# Patient Record
Sex: Male | Born: 1935 | Race: White | Hispanic: No | Smoking: Former smoker
Health system: Southern US, Community
[De-identification: ages and names within clinical notes are randomized; demographics above are authoritative.]

## PROBLEM LIST (undated history)

## (undated) DIAGNOSIS — H3530 Unspecified macular degeneration: Secondary | ICD-10-CM

## (undated) DIAGNOSIS — J449 Chronic obstructive pulmonary disease, unspecified: Secondary | ICD-10-CM

## (undated) DIAGNOSIS — I4892 Unspecified atrial flutter: Secondary | ICD-10-CM

## (undated) DIAGNOSIS — M1990 Unspecified osteoarthritis, unspecified site: Secondary | ICD-10-CM

## (undated) DIAGNOSIS — C61 Malignant neoplasm of prostate: Secondary | ICD-10-CM

## (undated) DIAGNOSIS — E7800 Pure hypercholesterolemia, unspecified: Secondary | ICD-10-CM

## (undated) DIAGNOSIS — I4891 Unspecified atrial fibrillation: Secondary | ICD-10-CM

## (undated) DIAGNOSIS — I10 Essential (primary) hypertension: Secondary | ICD-10-CM

## (undated) DIAGNOSIS — I701 Atherosclerosis of renal artery: Secondary | ICD-10-CM

## (undated) DIAGNOSIS — I714 Abdominal aortic aneurysm, without rupture, unspecified: Secondary | ICD-10-CM

## (undated) DIAGNOSIS — S72009A Fracture of unspecified part of neck of unspecified femur, initial encounter for closed fracture: Secondary | ICD-10-CM

## (undated) DIAGNOSIS — I6529 Occlusion and stenosis of unspecified carotid artery: Secondary | ICD-10-CM

## (undated) HISTORY — PX: APPENDECTOMY: SHX54

## (undated) HISTORY — DX: Unspecified atrial flutter: I48.92

## (undated) HISTORY — DX: Malignant neoplasm of prostate: C61

## (undated) HISTORY — DX: Chronic obstructive pulmonary disease, unspecified: J44.9

## (undated) HISTORY — DX: Atherosclerosis of renal artery: I70.1

## (undated) HISTORY — DX: Abdominal aortic aneurysm, without rupture: I71.4

## (undated) HISTORY — DX: Pure hypercholesterolemia, unspecified: E78.00

## (undated) HISTORY — DX: Fracture of unspecified part of neck of unspecified femur, initial encounter for closed fracture: S72.009A

## (undated) HISTORY — DX: Essential (primary) hypertension: I10

## (undated) HISTORY — PX: CHOLECYSTECTOMY: SHX55

## (undated) HISTORY — DX: Occlusion and stenosis of unspecified carotid artery: I65.29

## (undated) HISTORY — DX: Abdominal aortic aneurysm, without rupture, unspecified: I71.40

## (undated) HISTORY — DX: Unspecified atrial fibrillation: I48.91

## (undated) HISTORY — PX: TONSILLECTOMY: SUR1361

## (undated) HISTORY — DX: Unspecified osteoarthritis, unspecified site: M19.90

---

## 1998-05-12 ENCOUNTER — Emergency Department (HOSPITAL_COMMUNITY): Admission: EM | Admit: 1998-05-12 | Discharge: 1998-05-12 | Payer: Self-pay | Admitting: Emergency Medicine

## 1998-05-20 ENCOUNTER — Emergency Department (HOSPITAL_COMMUNITY): Admission: EM | Admit: 1998-05-20 | Discharge: 1998-05-20 | Payer: Self-pay | Admitting: Emergency Medicine

## 1998-05-27 ENCOUNTER — Emergency Department (HOSPITAL_COMMUNITY): Admission: EM | Admit: 1998-05-27 | Discharge: 1998-05-27 | Payer: Self-pay | Admitting: Emergency Medicine

## 1998-07-21 ENCOUNTER — Encounter: Admission: RE | Admit: 1998-07-21 | Discharge: 1998-10-19 | Payer: Self-pay | Admitting: Radiation Oncology

## 1998-08-18 ENCOUNTER — Encounter: Payer: Self-pay | Admitting: Urology

## 1998-10-13 ENCOUNTER — Ambulatory Visit (HOSPITAL_BASED_OUTPATIENT_CLINIC_OR_DEPARTMENT_OTHER): Admission: RE | Admit: 1998-10-13 | Discharge: 1998-10-13 | Payer: Self-pay | Admitting: Urology

## 1998-10-13 ENCOUNTER — Encounter: Payer: Self-pay | Admitting: Urology

## 1998-11-29 ENCOUNTER — Ambulatory Visit (HOSPITAL_COMMUNITY): Admission: RE | Admit: 1998-11-29 | Discharge: 1998-11-29 | Payer: Self-pay | Admitting: Radiation Oncology

## 1998-11-29 ENCOUNTER — Encounter: Payer: Self-pay | Admitting: Radiation Oncology

## 2000-06-29 ENCOUNTER — Emergency Department (HOSPITAL_COMMUNITY): Admission: EM | Admit: 2000-06-29 | Discharge: 2000-06-29 | Payer: Self-pay | Admitting: Emergency Medicine

## 2000-06-29 ENCOUNTER — Encounter: Payer: Self-pay | Admitting: Emergency Medicine

## 2000-11-07 ENCOUNTER — Ambulatory Visit (HOSPITAL_COMMUNITY): Admission: RE | Admit: 2000-11-07 | Discharge: 2000-11-07 | Payer: Self-pay | Admitting: Ophthalmology

## 2003-11-23 ENCOUNTER — Encounter: Payer: Self-pay | Admitting: Internal Medicine

## 2004-03-14 ENCOUNTER — Ambulatory Visit: Payer: Self-pay | Admitting: Internal Medicine

## 2004-07-05 ENCOUNTER — Ambulatory Visit: Payer: Self-pay | Admitting: Internal Medicine

## 2004-10-04 ENCOUNTER — Ambulatory Visit: Payer: Self-pay | Admitting: Internal Medicine

## 2005-01-13 ENCOUNTER — Ambulatory Visit: Payer: Self-pay | Admitting: Endocrinology

## 2005-01-13 ENCOUNTER — Inpatient Hospital Stay (HOSPITAL_COMMUNITY): Admission: EM | Admit: 2005-01-13 | Discharge: 2005-01-18 | Payer: Self-pay | Admitting: Family Medicine

## 2005-01-14 ENCOUNTER — Ambulatory Visit: Payer: Self-pay | Admitting: Cardiology

## 2005-01-14 ENCOUNTER — Encounter (INDEPENDENT_AMBULATORY_CARE_PROVIDER_SITE_OTHER): Payer: Self-pay | Admitting: *Deleted

## 2005-01-15 ENCOUNTER — Ambulatory Visit: Payer: Self-pay | Admitting: Internal Medicine

## 2005-01-16 ENCOUNTER — Encounter: Payer: Self-pay | Admitting: Cardiology

## 2005-01-21 ENCOUNTER — Ambulatory Visit: Payer: Self-pay | Admitting: Internal Medicine

## 2005-01-24 ENCOUNTER — Ambulatory Visit: Payer: Self-pay | Admitting: Cardiology

## 2005-01-28 ENCOUNTER — Ambulatory Visit: Payer: Self-pay | Admitting: Cardiology

## 2005-01-31 ENCOUNTER — Ambulatory Visit: Payer: Self-pay | Admitting: Internal Medicine

## 2005-02-04 ENCOUNTER — Ambulatory Visit: Payer: Self-pay | Admitting: Cardiology

## 2005-02-04 ENCOUNTER — Ambulatory Visit: Payer: Self-pay

## 2005-02-11 ENCOUNTER — Ambulatory Visit: Payer: Self-pay | Admitting: Cardiology

## 2005-02-18 ENCOUNTER — Ambulatory Visit: Payer: Self-pay | Admitting: Cardiology

## 2005-02-28 ENCOUNTER — Ambulatory Visit: Payer: Self-pay | Admitting: Cardiology

## 2005-03-20 ENCOUNTER — Ambulatory Visit: Payer: Self-pay | Admitting: Cardiology

## 2005-03-28 ENCOUNTER — Ambulatory Visit: Payer: Self-pay | Admitting: Internal Medicine

## 2005-04-02 ENCOUNTER — Ambulatory Visit: Payer: Self-pay

## 2005-04-02 ENCOUNTER — Encounter: Payer: Self-pay | Admitting: Cardiology

## 2005-04-04 ENCOUNTER — Ambulatory Visit: Payer: Self-pay | Admitting: Internal Medicine

## 2005-04-08 ENCOUNTER — Ambulatory Visit: Payer: Self-pay | Admitting: Cardiology

## 2005-05-16 ENCOUNTER — Ambulatory Visit: Payer: Self-pay | Admitting: Cardiology

## 2005-07-04 ENCOUNTER — Ambulatory Visit: Payer: Self-pay | Admitting: Internal Medicine

## 2005-07-07 ENCOUNTER — Encounter: Admission: RE | Admit: 2005-07-07 | Discharge: 2005-07-07 | Payer: Self-pay | Admitting: Internal Medicine

## 2005-07-29 ENCOUNTER — Ambulatory Visit: Payer: Self-pay | Admitting: Internal Medicine

## 2005-09-13 ENCOUNTER — Ambulatory Visit: Payer: Self-pay | Admitting: Internal Medicine

## 2005-10-03 ENCOUNTER — Ambulatory Visit: Payer: Self-pay | Admitting: Internal Medicine

## 2005-10-30 ENCOUNTER — Ambulatory Visit: Payer: Self-pay | Admitting: Cardiology

## 2005-10-31 ENCOUNTER — Ambulatory Visit: Payer: Self-pay | Admitting: Internal Medicine

## 2005-11-26 ENCOUNTER — Ambulatory Visit: Payer: Self-pay

## 2005-12-09 ENCOUNTER — Ambulatory Visit: Payer: Self-pay | Admitting: Cardiology

## 2005-12-11 ENCOUNTER — Ambulatory Visit (HOSPITAL_COMMUNITY): Admission: RE | Admit: 2005-12-11 | Discharge: 2005-12-11 | Payer: Self-pay | Admitting: Cardiology

## 2005-12-13 ENCOUNTER — Ambulatory Visit: Payer: Self-pay | Admitting: Cardiology

## 2005-12-17 ENCOUNTER — Observation Stay (HOSPITAL_COMMUNITY): Admission: RE | Admit: 2005-12-17 | Discharge: 2005-12-18 | Payer: Self-pay | Admitting: Cardiology

## 2005-12-17 ENCOUNTER — Ambulatory Visit: Payer: Self-pay | Admitting: Cardiology

## 2006-01-07 ENCOUNTER — Ambulatory Visit: Payer: Self-pay | Admitting: Internal Medicine

## 2006-01-15 ENCOUNTER — Ambulatory Visit: Payer: Self-pay | Admitting: Cardiology

## 2006-02-12 ENCOUNTER — Ambulatory Visit: Payer: Self-pay | Admitting: Cardiology

## 2006-05-12 ENCOUNTER — Ambulatory Visit: Payer: Self-pay | Admitting: Internal Medicine

## 2006-05-26 ENCOUNTER — Ambulatory Visit: Payer: Self-pay | Admitting: Internal Medicine

## 2006-05-26 LAB — CONVERTED CEMR LAB
BUN: 9 mg/dL (ref 6–23)
CO2: 31 meq/L (ref 19–32)
Creatinine, Ser: 0.8 mg/dL (ref 0.4–1.5)
GFR calc non Af Amer: 99 mL/min

## 2006-08-01 ENCOUNTER — Ambulatory Visit: Payer: Self-pay | Admitting: Cardiology

## 2006-08-01 LAB — CONVERTED CEMR LAB
ALT: 20 units/L (ref 0–40)
Albumin: 3.8 g/dL (ref 3.5–5.2)
Alkaline Phosphatase: 138 units/L — ABNORMAL HIGH (ref 39–117)
BUN: 10 mg/dL (ref 6–23)
CO2: 29 meq/L (ref 19–32)
Calcium: 9.5 mg/dL (ref 8.4–10.5)
GFR calc Af Amer: 140 mL/min
GFR calc non Af Amer: 115 mL/min
Glucose, Bld: 103 mg/dL — ABNORMAL HIGH (ref 70–99)
Potassium: 3.8 meq/L (ref 3.5–5.1)
Sodium: 139 meq/L (ref 135–145)
Total CHOL/HDL Ratio: 3.3
Total Protein: 6.8 g/dL (ref 6.0–8.3)
VLDL: 27 mg/dL (ref 0–40)

## 2006-08-26 ENCOUNTER — Ambulatory Visit: Payer: Self-pay | Admitting: Internal Medicine

## 2006-08-26 ENCOUNTER — Encounter: Payer: Self-pay | Admitting: Internal Medicine

## 2006-08-26 DIAGNOSIS — I428 Other cardiomyopathies: Secondary | ICD-10-CM | POA: Insufficient documentation

## 2006-08-26 DIAGNOSIS — I714 Abdominal aortic aneurysm, without rupture, unspecified: Secondary | ICD-10-CM | POA: Insufficient documentation

## 2006-08-26 DIAGNOSIS — J4489 Other specified chronic obstructive pulmonary disease: Secondary | ICD-10-CM | POA: Insufficient documentation

## 2006-08-26 DIAGNOSIS — J449 Chronic obstructive pulmonary disease, unspecified: Secondary | ICD-10-CM

## 2006-08-26 DIAGNOSIS — I10 Essential (primary) hypertension: Secondary | ICD-10-CM | POA: Insufficient documentation

## 2006-08-26 DIAGNOSIS — I701 Atherosclerosis of renal artery: Secondary | ICD-10-CM

## 2006-08-26 DIAGNOSIS — Z8546 Personal history of malignant neoplasm of prostate: Secondary | ICD-10-CM | POA: Insufficient documentation

## 2006-10-13 ENCOUNTER — Ambulatory Visit: Payer: Self-pay | Admitting: Cardiology

## 2006-10-13 LAB — CONVERTED CEMR LAB
BUN: 14 mg/dL (ref 6–23)
CO2: 29 meq/L (ref 19–32)
Creatinine, Ser: 0.8 mg/dL (ref 0.4–1.5)
GFR calc non Af Amer: 99 mL/min

## 2006-12-15 ENCOUNTER — Telehealth: Payer: Self-pay | Admitting: Internal Medicine

## 2006-12-30 ENCOUNTER — Ambulatory Visit: Payer: Self-pay | Admitting: Internal Medicine

## 2007-01-12 ENCOUNTER — Telehealth (INDEPENDENT_AMBULATORY_CARE_PROVIDER_SITE_OTHER): Payer: Self-pay | Admitting: *Deleted

## 2007-01-12 ENCOUNTER — Telehealth: Payer: Self-pay | Admitting: Internal Medicine

## 2007-03-04 ENCOUNTER — Encounter: Payer: Self-pay | Admitting: Internal Medicine

## 2007-04-13 ENCOUNTER — Ambulatory Visit: Payer: Self-pay | Admitting: Cardiology

## 2007-04-13 ENCOUNTER — Ambulatory Visit: Payer: Self-pay

## 2007-04-13 LAB — CONVERTED CEMR LAB
AST: 36 units/L (ref 0–37)
Alkaline Phosphatase: 138 units/L — ABNORMAL HIGH (ref 39–117)
Bilirubin, Direct: 0.1 mg/dL (ref 0.0–0.3)
CO2: 29 meq/L (ref 19–32)
GFR calc non Af Amer: 99 mL/min
HDL: 39.3 mg/dL (ref >39.0)
LDL Cholesterol: 48 mg/dL (ref 0–99)
Total Bilirubin: 0.6 mg/dL (ref 0.3–1.2)
Total Protein: 6.7 g/dL (ref 6.0–8.3)
Triglycerides: 78 mg/dL (ref 0–149)

## 2007-04-15 ENCOUNTER — Ambulatory Visit: Payer: Self-pay

## 2007-04-30 ENCOUNTER — Ambulatory Visit: Payer: Self-pay | Admitting: Cardiovascular Disease

## 2007-06-01 ENCOUNTER — Ambulatory Visit: Payer: Self-pay | Admitting: Internal Medicine

## 2007-06-01 LAB — CONVERTED CEMR LAB
Basophils Absolute: 0 10*3/uL (ref 0.0–0.1)
Bilirubin Urine: NEGATIVE
Bilirubin, Direct: 0.2 mg/dL (ref 0.0–0.3)
CO2: 30 meq/L (ref 19–32)
Cholesterol: 102 mg/dL (ref 0–200)
Creatinine, Ser: 0.7 mg/dL (ref 0.4–1.5)
Eosinophils Absolute: 0.3 10*3/uL (ref 0.0–0.6)
Eosinophils Relative: 4.1 % (ref 0.0–5.0)
GFR calc non Af Amer: 115 mL/min
HDL: 30.7 mg/dL — ABNORMAL LOW (ref >39.0)
Ketones, urine, test strip: NEGATIVE
LDL Cholesterol: 59 mg/dL (ref 0–99)
Lymphocytes Relative: 22.4 % (ref 12.0–46.0)
MCV: 90.6 fL (ref 78.0–100.0)
Monocytes Absolute: 0.9 10*3/uL — ABNORMAL HIGH (ref 0.2–0.7)
Monocytes Relative: 13.2 % — ABNORMAL HIGH (ref 3.0–11.0)
Neutrophils Relative %: 59.7 % (ref 43.0–77.0)
Nitrite: NEGATIVE
Platelets: 209 10*3/uL (ref 150–400)
Potassium: 3.9 meq/L (ref 3.5–5.1)
RBC: 4.54 M/uL (ref 4.22–5.81)
Sodium: 144 meq/L (ref 135–145)
Specific Gravity, Urine: 1.02
Total Bilirubin: 0.8 mg/dL (ref 0.3–1.2)
Total Protein: 6.5 g/dL (ref 6.0–8.3)
Urobilinogen, UA: 0.2
WBC Urine, dipstick: NEGATIVE

## 2007-06-08 ENCOUNTER — Ambulatory Visit: Payer: Self-pay | Admitting: Internal Medicine

## 2007-07-21 ENCOUNTER — Telehealth: Payer: Self-pay | Admitting: Internal Medicine

## 2007-08-03 ENCOUNTER — Ambulatory Visit: Payer: Self-pay | Admitting: Internal Medicine

## 2007-08-03 ENCOUNTER — Telehealth: Payer: Self-pay | Admitting: Internal Medicine

## 2007-08-03 DIAGNOSIS — H6500 Acute serous otitis media, unspecified ear: Secondary | ICD-10-CM

## 2007-08-03 DIAGNOSIS — R197 Diarrhea, unspecified: Secondary | ICD-10-CM

## 2007-08-07 ENCOUNTER — Telehealth: Payer: Self-pay | Admitting: Internal Medicine

## 2007-09-15 ENCOUNTER — Telehealth: Payer: Self-pay | Admitting: Internal Medicine

## 2007-09-16 ENCOUNTER — Ambulatory Visit: Payer: Self-pay | Admitting: Cardiology

## 2007-09-16 LAB — CONVERTED CEMR LAB
ALT: 28 units/L (ref 0–53)
Albumin: 3.9 g/dL (ref 3.5–5.2)
Bilirubin, Direct: 0.1 mg/dL (ref 0.0–0.3)
Chloride: 103 meq/L (ref 96–112)
GFR calc Af Amer: 119 mL/min
Potassium: 4.3 meq/L (ref 3.5–5.1)
Total Protein: 7.4 g/dL (ref 6.0–8.3)
VLDL: 14 mg/dL (ref 0–40)

## 2007-10-08 ENCOUNTER — Ambulatory Visit: Payer: Self-pay | Admitting: Internal Medicine

## 2007-10-09 ENCOUNTER — Telehealth: Payer: Self-pay | Admitting: Internal Medicine

## 2007-10-15 ENCOUNTER — Ambulatory Visit: Payer: Self-pay

## 2007-12-16 ENCOUNTER — Ambulatory Visit: Payer: Self-pay | Admitting: Internal Medicine

## 2007-12-16 DIAGNOSIS — M25559 Pain in unspecified hip: Secondary | ICD-10-CM | POA: Insufficient documentation

## 2007-12-16 DIAGNOSIS — M545 Low back pain: Secondary | ICD-10-CM

## 2007-12-20 ENCOUNTER — Emergency Department (HOSPITAL_COMMUNITY): Admission: EM | Admit: 2007-12-20 | Discharge: 2007-12-20 | Payer: Self-pay | Admitting: Family Medicine

## 2007-12-21 ENCOUNTER — Telehealth: Payer: Self-pay | Admitting: Internal Medicine

## 2007-12-24 ENCOUNTER — Encounter: Admission: RE | Admit: 2007-12-24 | Discharge: 2007-12-24 | Payer: Self-pay | Admitting: Internal Medicine

## 2007-12-28 ENCOUNTER — Ambulatory Visit: Payer: Self-pay | Admitting: Internal Medicine

## 2008-01-13 ENCOUNTER — Ambulatory Visit: Payer: Self-pay | Admitting: Internal Medicine

## 2008-01-22 ENCOUNTER — Ambulatory Visit: Payer: Self-pay | Admitting: Vascular Surgery

## 2008-04-07 ENCOUNTER — Ambulatory Visit: Payer: Self-pay | Admitting: Cardiology

## 2008-04-07 ENCOUNTER — Ambulatory Visit: Payer: Self-pay

## 2008-04-07 LAB — CONVERTED CEMR LAB
ALT: 16 units/L (ref 0–53)
AST: 24 units/L (ref 0–37)
Bilirubin, Direct: 0.1 mg/dL (ref 0.0–0.3)
Cholesterol: 112 mg/dL (ref 0–200)
Creatinine, Ser: 0.7 mg/dL (ref 0.4–1.5)
Eosinophils Absolute: 0.2 10*3/uL (ref 0.0–0.7)
Eosinophils Relative: 2.1 % (ref 0.0–5.0)
GFR calc non Af Amer: 115 mL/min
HDL: 29 mg/dL — ABNORMAL LOW (ref >39.0)
LDL Cholesterol: 66 mg/dL (ref 0–99)
Neutro Abs: 5.4 10*3/uL (ref 1.4–7.7)
Neutrophils Relative %: 66 % (ref 43.0–77.0)
Platelets: 205 10*3/uL (ref 150–400)
Potassium: 3.1 meq/L — ABNORMAL LOW (ref 3.5–5.1)
RBC: 4.31 M/uL (ref 4.22–5.81)
Sodium: 142 meq/L (ref 135–145)
Total CHOL/HDL Ratio: 3.9
Triglycerides: 83 mg/dL (ref 0–149)
VLDL: 17 mg/dL (ref 0–40)
WBC: 8.2 10*3/uL (ref 4.5–10.5)

## 2008-04-11 ENCOUNTER — Encounter: Payer: Self-pay | Admitting: Cardiology

## 2008-04-11 ENCOUNTER — Ambulatory Visit: Payer: Self-pay | Admitting: Cardiology

## 2008-04-11 ENCOUNTER — Ambulatory Visit: Payer: Self-pay

## 2008-04-19 ENCOUNTER — Ambulatory Visit: Payer: Self-pay | Admitting: Cardiology

## 2008-04-26 ENCOUNTER — Ambulatory Visit: Payer: Self-pay | Admitting: Internal Medicine

## 2008-04-26 LAB — CONVERTED CEMR LAB
INR: 1.5
Prothrombin Time: 14.9 s

## 2008-05-02 ENCOUNTER — Ambulatory Visit: Payer: Self-pay | Admitting: Cardiology

## 2008-05-02 LAB — CONVERTED CEMR LAB
Creatinine, Ser: 0.8 mg/dL (ref 0.4–1.5)
GFR calc non Af Amer: 98 mL/min
Glucose, Bld: 126 mg/dL — ABNORMAL HIGH (ref 70–99)
Potassium: 3.6 meq/L (ref 3.5–5.1)
Sodium: 140 meq/L (ref 135–145)

## 2008-05-06 ENCOUNTER — Ambulatory Visit: Payer: Self-pay | Admitting: Cardiology

## 2008-05-10 ENCOUNTER — Ambulatory Visit: Payer: Self-pay | Admitting: Internal Medicine

## 2008-05-10 DIAGNOSIS — I4891 Unspecified atrial fibrillation: Secondary | ICD-10-CM | POA: Insufficient documentation

## 2008-07-13 ENCOUNTER — Ambulatory Visit: Payer: Self-pay | Admitting: Internal Medicine

## 2008-08-02 ENCOUNTER — Telehealth: Payer: Self-pay | Admitting: Internal Medicine

## 2008-08-09 ENCOUNTER — Ambulatory Visit: Payer: Self-pay | Admitting: Cardiology

## 2008-08-09 DIAGNOSIS — I6529 Occlusion and stenosis of unspecified carotid artery: Secondary | ICD-10-CM

## 2008-08-09 DIAGNOSIS — E7800 Pure hypercholesterolemia, unspecified: Secondary | ICD-10-CM

## 2008-08-12 ENCOUNTER — Ambulatory Visit: Payer: Self-pay | Admitting: Internal Medicine

## 2008-08-12 LAB — CONVERTED CEMR LAB
POC INR: 0.9
Protime: 11.6

## 2008-08-16 ENCOUNTER — Encounter: Payer: Self-pay | Admitting: *Deleted

## 2008-08-16 ENCOUNTER — Ambulatory Visit: Payer: Self-pay | Admitting: Cardiology

## 2008-08-16 LAB — CONVERTED CEMR LAB: POC INR: 1.7

## 2008-08-23 ENCOUNTER — Ambulatory Visit: Payer: Self-pay | Admitting: Cardiology

## 2008-08-23 ENCOUNTER — Telehealth (INDEPENDENT_AMBULATORY_CARE_PROVIDER_SITE_OTHER): Payer: Self-pay | Admitting: *Deleted

## 2008-08-23 LAB — CONVERTED CEMR LAB
INR: 6.5
INR: 6.5 (ref 0.8–1.0)
POC INR: 7.6
Protime: 33.2

## 2008-08-25 ENCOUNTER — Emergency Department (HOSPITAL_COMMUNITY): Admission: EM | Admit: 2008-08-25 | Discharge: 2008-08-25 | Payer: Self-pay | Admitting: Emergency Medicine

## 2008-08-30 ENCOUNTER — Ambulatory Visit: Payer: Self-pay | Admitting: Internal Medicine

## 2008-08-30 LAB — CONVERTED CEMR LAB: POC INR: 3.6

## 2008-09-13 ENCOUNTER — Ambulatory Visit: Payer: Self-pay | Admitting: Internal Medicine

## 2008-09-21 ENCOUNTER — Encounter: Payer: Self-pay | Admitting: *Deleted

## 2008-09-23 ENCOUNTER — Ambulatory Visit: Payer: Self-pay | Admitting: Cardiology

## 2008-09-23 ENCOUNTER — Encounter (INDEPENDENT_AMBULATORY_CARE_PROVIDER_SITE_OTHER): Payer: Self-pay | Admitting: Cardiology

## 2008-09-23 LAB — CONVERTED CEMR LAB: Prothrombin Time: 14.5 s

## 2008-10-10 ENCOUNTER — Telehealth: Payer: Self-pay | Admitting: Cardiology

## 2008-10-13 ENCOUNTER — Ambulatory Visit: Payer: Self-pay

## 2008-10-13 LAB — CONVERTED CEMR LAB
POC INR: 1.6
Prothrombin Time: 15.8 s

## 2008-10-17 ENCOUNTER — Ambulatory Visit: Payer: Self-pay | Admitting: Cardiology

## 2008-10-26 ENCOUNTER — Telehealth: Payer: Self-pay | Admitting: Cardiology

## 2008-10-27 ENCOUNTER — Encounter: Payer: Self-pay | Admitting: *Deleted

## 2008-10-27 ENCOUNTER — Ambulatory Visit: Payer: Self-pay | Admitting: Internal Medicine

## 2008-10-27 ENCOUNTER — Ambulatory Visit: Payer: Self-pay | Admitting: Cardiology

## 2008-10-27 LAB — CONVERTED CEMR LAB: POC INR: 1.2

## 2008-10-28 LAB — CONVERTED CEMR LAB
BUN: 19 mg/dL (ref 6–23)
CO2: 27 meq/L (ref 19–32)
Calcium: 9 mg/dL (ref 8.4–10.5)
Chloride: 107 meq/L (ref 96–112)
GFR calc non Af Amer: 85.64 mL/min (ref >60)
Glucose, Bld: 101 mg/dL — ABNORMAL HIGH (ref 70–99)
Potassium: 3.7 meq/L (ref 3.5–5.1)

## 2008-10-31 ENCOUNTER — Telehealth: Payer: Self-pay | Admitting: Cardiology

## 2008-11-04 ENCOUNTER — Ambulatory Visit: Payer: Self-pay

## 2008-11-04 ENCOUNTER — Ambulatory Visit: Payer: Self-pay | Admitting: Cardiovascular Disease

## 2008-11-04 ENCOUNTER — Ambulatory Visit: Payer: Self-pay | Admitting: Cardiology

## 2008-11-04 LAB — CONVERTED CEMR LAB: POC INR: 1.5

## 2008-11-07 ENCOUNTER — Encounter: Payer: Self-pay | Admitting: Cardiology

## 2008-11-08 ENCOUNTER — Telehealth: Payer: Self-pay | Admitting: Cardiology

## 2008-11-14 ENCOUNTER — Ambulatory Visit: Payer: Self-pay | Admitting: Cardiovascular Disease

## 2008-11-14 ENCOUNTER — Encounter (INDEPENDENT_AMBULATORY_CARE_PROVIDER_SITE_OTHER): Payer: Self-pay | Admitting: *Deleted

## 2008-11-14 ENCOUNTER — Telehealth (INDEPENDENT_AMBULATORY_CARE_PROVIDER_SITE_OTHER): Payer: Self-pay | Admitting: *Deleted

## 2008-11-14 LAB — CONVERTED CEMR LAB: POC INR: 2.4

## 2008-11-15 ENCOUNTER — Telehealth: Payer: Self-pay | Admitting: Cardiology

## 2008-11-28 ENCOUNTER — Ambulatory Visit: Payer: Self-pay | Admitting: Cardiovascular Disease

## 2008-11-28 LAB — CONVERTED CEMR LAB: POC INR: 3.2

## 2008-12-02 ENCOUNTER — Telehealth (INDEPENDENT_AMBULATORY_CARE_PROVIDER_SITE_OTHER): Payer: Self-pay | Admitting: *Deleted

## 2008-12-12 ENCOUNTER — Ambulatory Visit: Payer: Self-pay | Admitting: Cardiology

## 2008-12-12 LAB — CONVERTED CEMR LAB: POC INR: 6

## 2008-12-19 ENCOUNTER — Ambulatory Visit: Payer: Self-pay | Admitting: Internal Medicine

## 2008-12-29 ENCOUNTER — Ambulatory Visit: Payer: Self-pay | Admitting: Cardiology

## 2009-01-05 ENCOUNTER — Ambulatory Visit: Payer: Self-pay | Admitting: Cardiovascular Disease

## 2009-01-19 ENCOUNTER — Ambulatory Visit: Payer: Self-pay | Admitting: Internal Medicine

## 2009-02-02 ENCOUNTER — Ambulatory Visit: Payer: Self-pay | Admitting: Cardiology

## 2009-02-10 ENCOUNTER — Ambulatory Visit: Payer: Self-pay | Admitting: Cardiology

## 2009-02-10 ENCOUNTER — Telehealth: Payer: Self-pay | Admitting: Cardiology

## 2009-02-10 DIAGNOSIS — I10 Essential (primary) hypertension: Secondary | ICD-10-CM

## 2009-02-11 ENCOUNTER — Emergency Department (HOSPITAL_COMMUNITY): Admission: EM | Admit: 2009-02-11 | Discharge: 2009-02-11 | Payer: Self-pay | Admitting: Emergency Medicine

## 2009-02-11 ENCOUNTER — Telehealth: Payer: Self-pay | Admitting: Nurse Practitioner

## 2009-02-12 LAB — CONVERTED CEMR LAB
CO2: 35 meq/L — ABNORMAL HIGH (ref 19–32)
Calcium: 9.1 mg/dL (ref 8.4–10.5)
Chloride: 99 meq/L (ref 96–112)
Glucose, Bld: 127 mg/dL — ABNORMAL HIGH (ref 70–99)
Potassium: 3 meq/L — ABNORMAL LOW (ref 3.5–5.1)
Sodium: 143 meq/L (ref 135–145)

## 2009-02-13 ENCOUNTER — Telehealth: Payer: Self-pay | Admitting: Cardiology

## 2009-02-13 ENCOUNTER — Encounter: Payer: Self-pay | Admitting: Cardiology

## 2009-02-13 LAB — CONVERTED CEMR LAB: POC INR: 3.15

## 2009-02-20 ENCOUNTER — Ambulatory Visit: Payer: Self-pay | Admitting: Cardiology

## 2009-02-20 LAB — CONVERTED CEMR LAB: POC INR: 1.9

## 2009-02-24 ENCOUNTER — Encounter (INDEPENDENT_AMBULATORY_CARE_PROVIDER_SITE_OTHER): Payer: Self-pay | Admitting: *Deleted

## 2009-02-24 LAB — CONVERTED CEMR LAB
ALT: 18 units/L (ref 0–53)
Alkaline Phosphatase: 139 units/L — ABNORMAL HIGH (ref 39–117)
BUN: 5 mg/dL — ABNORMAL LOW (ref 6–23)
Bilirubin, Direct: 0 mg/dL (ref 0.0–0.3)
Cholesterol: 117 mg/dL (ref 0–200)
GFR calc non Af Amer: 85.57 mL/min (ref >60)
HDL: 30 mg/dL — ABNORMAL LOW (ref >39.00)
LDL Cholesterol: 61 mg/dL (ref 0–99)
Sodium: 140 meq/L (ref 135–145)
Total Bilirubin: 0.7 mg/dL (ref 0.3–1.2)

## 2009-02-28 ENCOUNTER — Ambulatory Visit: Payer: Self-pay | Admitting: Cardiology

## 2009-02-28 LAB — CONVERTED CEMR LAB: POC INR: 2

## 2009-03-14 ENCOUNTER — Encounter (INDEPENDENT_AMBULATORY_CARE_PROVIDER_SITE_OTHER): Payer: Self-pay | Admitting: Cardiology

## 2009-03-14 ENCOUNTER — Ambulatory Visit: Payer: Self-pay | Admitting: Cardiology

## 2009-03-14 LAB — CONVERTED CEMR LAB: POC INR: 3.8

## 2009-04-11 ENCOUNTER — Ambulatory Visit: Payer: Self-pay | Admitting: Cardiovascular Disease

## 2009-04-11 LAB — CONVERTED CEMR LAB: POC INR: 2.3

## 2009-04-19 ENCOUNTER — Ambulatory Visit: Payer: Self-pay | Admitting: Internal Medicine

## 2009-04-19 DIAGNOSIS — M1990 Unspecified osteoarthritis, unspecified site: Secondary | ICD-10-CM | POA: Insufficient documentation

## 2009-04-19 DIAGNOSIS — E739 Lactose intolerance, unspecified: Secondary | ICD-10-CM

## 2009-04-19 LAB — CONVERTED CEMR LAB
Hgb A1c MFr Bld: 6.4 % (ref 4.6–6.5)
PSA: 0.02 ng/mL — ABNORMAL LOW (ref 0.10–4.00)

## 2009-05-03 ENCOUNTER — Ambulatory Visit: Payer: Self-pay | Admitting: Internal Medicine

## 2009-05-03 ENCOUNTER — Ambulatory Visit: Payer: Self-pay

## 2009-05-03 ENCOUNTER — Encounter: Payer: Self-pay | Admitting: Cardiology

## 2009-05-15 ENCOUNTER — Ambulatory Visit: Payer: Self-pay | Admitting: Cardiology

## 2009-05-15 LAB — CONVERTED CEMR LAB: POC INR: 4.9

## 2009-05-29 ENCOUNTER — Ambulatory Visit: Payer: Self-pay | Admitting: Internal Medicine

## 2009-05-29 LAB — CONVERTED CEMR LAB: POC INR: 4.3

## 2009-06-12 ENCOUNTER — Ambulatory Visit: Payer: Self-pay | Admitting: Cardiovascular Disease

## 2009-06-12 LAB — CONVERTED CEMR LAB: POC INR: 1.6

## 2009-06-26 ENCOUNTER — Ambulatory Visit: Payer: Self-pay | Admitting: Cardiovascular Disease

## 2009-06-26 LAB — CONVERTED CEMR LAB: POC INR: 1.8

## 2009-07-10 ENCOUNTER — Telehealth: Payer: Self-pay | Admitting: Cardiology

## 2009-07-17 ENCOUNTER — Ambulatory Visit: Payer: Self-pay | Admitting: Cardiology

## 2009-07-28 ENCOUNTER — Encounter: Payer: Self-pay | Admitting: Internal Medicine

## 2009-07-28 ENCOUNTER — Ambulatory Visit: Payer: Self-pay | Admitting: Vascular Surgery

## 2009-08-07 ENCOUNTER — Ambulatory Visit: Payer: Self-pay | Admitting: Cardiology

## 2009-08-28 ENCOUNTER — Ambulatory Visit: Payer: Self-pay | Admitting: Cardiology

## 2009-08-30 ENCOUNTER — Telehealth (INDEPENDENT_AMBULATORY_CARE_PROVIDER_SITE_OTHER): Payer: Self-pay | Admitting: *Deleted

## 2009-09-28 ENCOUNTER — Ambulatory Visit: Payer: Self-pay | Admitting: Internal Medicine

## 2009-09-28 DIAGNOSIS — H6120 Impacted cerumen, unspecified ear: Secondary | ICD-10-CM

## 2009-10-13 ENCOUNTER — Ambulatory Visit: Payer: Self-pay | Admitting: Cardiology

## 2009-12-11 ENCOUNTER — Ambulatory Visit: Payer: Self-pay | Admitting: Cardiovascular Disease

## 2010-01-08 ENCOUNTER — Ambulatory Visit: Payer: Self-pay | Admitting: Internal Medicine

## 2010-01-08 LAB — CONVERTED CEMR LAB: POC INR: 2.6

## 2010-03-14 ENCOUNTER — Ambulatory Visit: Admission: RE | Admit: 2010-03-14 | Discharge: 2010-03-14 | Payer: Self-pay | Source: Home / Self Care

## 2010-03-22 ENCOUNTER — Ambulatory Visit: Admission: RE | Admit: 2010-03-22 | Discharge: 2010-03-22 | Payer: Self-pay | Source: Home / Self Care

## 2010-03-22 ENCOUNTER — Ambulatory Visit
Admission: RE | Admit: 2010-03-22 | Discharge: 2010-03-22 | Payer: Self-pay | Source: Home / Self Care | Attending: Cardiology | Admitting: Cardiology

## 2010-03-22 ENCOUNTER — Encounter: Payer: Self-pay | Admitting: Cardiology

## 2010-03-22 LAB — CONVERTED CEMR LAB: POC INR: 2.3

## 2010-04-05 ENCOUNTER — Other Ambulatory Visit: Payer: Self-pay | Admitting: Internal Medicine

## 2010-04-05 ENCOUNTER — Ambulatory Visit
Admission: RE | Admit: 2010-04-05 | Discharge: 2010-04-05 | Payer: Self-pay | Source: Home / Self Care | Attending: Internal Medicine | Admitting: Internal Medicine

## 2010-04-05 LAB — CBC WITH DIFFERENTIAL/PLATELET
Basophils Absolute: 0.1 10*3/uL (ref 0.0–0.1)
Basophils Relative: 0.4 % (ref 0.0–3.0)
Eosinophils Absolute: 0.4 10*3/uL (ref 0.0–0.7)
Eosinophils Relative: 2.9 % (ref 0.0–5.0)
HCT: 48.1 % (ref 39.0–52.0)
Hemoglobin: 16.3 g/dL (ref 13.0–17.0)
Lymphocytes Relative: 19.8 % (ref 12.0–46.0)
Lymphs Abs: 2.6 10*3/uL (ref 0.7–4.0)
MCHC: 33.8 g/dL (ref 30.0–36.0)
MCV: 90.8 fl (ref 78.0–100.0)
Monocytes Absolute: 0.9 10*3/uL (ref 0.1–1.0)
Monocytes Relative: 6.8 % (ref 3.0–12.0)
Neutro Abs: 9.1 10*3/uL — ABNORMAL HIGH (ref 1.4–7.7)
Neutrophils Relative %: 70.1 % (ref 43.0–77.0)
Platelets: 276 10*3/uL (ref 150.0–400.0)
RBC: 5.3 Mil/uL (ref 4.22–5.81)
RDW: 14.6 % (ref 11.5–14.6)
WBC: 12.9 10*3/uL — ABNORMAL HIGH (ref 4.5–10.5)

## 2010-04-05 LAB — HEPATIC FUNCTION PANEL
ALT: 15 U/L (ref 0–53)
AST: 29 U/L (ref 0–37)
Albumin: 4 g/dL (ref 3.5–5.2)
Alkaline Phosphatase: 161 U/L — ABNORMAL HIGH (ref 39–117)
Bilirubin, Direct: 0.2 mg/dL (ref 0.0–0.3)
Total Bilirubin: 1.1 mg/dL (ref 0.3–1.2)
Total Protein: 7.2 g/dL (ref 6.0–8.3)

## 2010-04-05 LAB — LIPID PANEL
Cholesterol: 155 mg/dL (ref 0–200)
HDL: 31.6 mg/dL — ABNORMAL LOW (ref >39.00)
LDL Cholesterol: 93 mg/dL (ref 0–99)
Total CHOL/HDL Ratio: 5
Triglycerides: 154 mg/dL — ABNORMAL HIGH (ref 0.0–149.0)
VLDL: 30.8 mg/dL (ref 0.0–40.0)

## 2010-04-05 LAB — BASIC METABOLIC PANEL
BUN: 7 mg/dL (ref 6–23)
CO2: 29 mEq/L (ref 19–32)
Calcium: 9 mg/dL (ref 8.4–10.5)
Chloride: 100 mEq/L (ref 96–112)
Creatinine, Ser: 0.8 mg/dL (ref 0.4–1.5)
GFR: 96.37 mL/min (ref >60.00)
Glucose, Bld: 110 mg/dL — ABNORMAL HIGH (ref 70–99)
Potassium: 4.4 mEq/L (ref 3.5–5.1)
Sodium: 140 mEq/L (ref 135–145)

## 2010-04-05 LAB — PSA: PSA: 0.01 ng/mL — ABNORMAL LOW (ref 0.10–4.00)

## 2010-04-05 LAB — TSH: TSH: 1.38 u[IU]/mL (ref 0.35–5.50)

## 2010-04-17 NOTE — Medication Information (Signed)
Summary: rov/cb  Anticoagulant Therapy  Managed by: Bethena Midget, RN, BSN Supervising MD: Juanda Chance MD, Dagon Budai Indication 1: Atrial Fibrillation (ICD-427.31) Lab Used: [facility] [place] Site: [address] INR POC 2.5 INR RANGE 2-3  Dietary changes: no    Health status changes: no    Bleeding/hemorrhagic complications: no    Recent/future hospitalizations: no    Any changes in medication regimen? no    Recent/future dental: no  Any missed doses?: no       Is patient compliant with meds? yes       Allergies: No Known Drug Allergies  Anticoagulation Management History:      The patient is taking warfarin and comes in today for a routine follow up visit.  Positive risk factors for bleeding include an age of 75 years or older.  The bleeding index is 'intermediate risk'.  Positive CHADS2 values include History of HTN and Age > 75 years old.  The start date was 04/07/2008.  His last INR was 5.6 ratio.  Anticoagulation responsible provider: Juanda Chance MD, Smitty Cords.  INR POC: 2.5.  Cuvette Lot#: [id number].  Exp: 08/2010.    Anticoagulation Management Assessment/Plan:      The patient's current anticoagulation dose is Coumadin 5 mg tabs: Take as directed by coumadin clinic..  The target INR is 2.0-3.0.  The next INR is due 08/07/2009.  Anticoagulation instructions were given to daughter.  Results were reviewed/authorized by Bethena Midget, RN, BSN.  He was notified by Bethena Midget, RN, BSN.         Prior Anticoagulation Instructions: INR 1.8. Take 1.5 tablets today, then take 0.5 tablet daily except 1 tablet on Mon and Fri. Recheck in 10-14 days.  Current Anticoagulation Instructions: INR 2.5 Continue 2.5mg s everyday except 5mg s on Mondays. Recheck in 3 weeks.

## 2010-04-17 NOTE — Progress Notes (Signed)
Summary: QUESTION ABOUT MEDICATION  Phone Note Call from Patient Call back at [phone]   Caller: Daughter/[name] Summary of Call: qUESTIONS ABOUT MEDICATION Initial call taken by: Judie Grieve,  July 10, 2009 10:21 AM  Follow-up for Phone Call        Pt seeing ortho MD dx with severe arthritis R knee.  Wanting an OTC arthritis medication compatable with coumadin.  Advised OK to try OTC Tylenol arthritis, and or Glucosamine Chondroitin.  Advised to avoid arthitis meds with anti inflammatory or ASAs in them. Follow-up by: Cloyde Reams RN,  July 10, 2009 3:42 PM

## 2010-04-17 NOTE — Medication Information (Signed)
Summary: rov/tm  Anticoagulant Therapy  Managed by: Cloyde Reams, RN, BSN Supervising MD: Jens Som MD, Arlys John Indication 1: Atrial Fibrillation (ICD-427.31) Lab Used: [facility] [place] Site: [address] INR POC 2.8 INR RANGE 2-3  Dietary changes: no    Health status changes: no    Bleeding/hemorrhagic complications: no    Recent/future hospitalizations: no    Any changes in medication regimen? yes       Details: Changed Simvastatin to Pravastatin.    Recent/future dental: no  Any missed doses?: no       Is patient compliant with meds? yes       Allergies: No Known Drug Allergies  Anticoagulation Management History:      The patient is taking warfarin and comes in today for a routine follow up visit.  Positive risk factors for bleeding include an age of 75 years or older.  The bleeding index is 'intermediate risk'.  Positive CHADS2 values include History of HTN and Age > 70 years old.  The start date was 04/07/2008.  His last INR was 5.6 ratio.  Anticoagulation responsible provider: Jens Som MD, Arlys John.  INR POC: 2.8.  Cuvette Lot#: [id number].  Exp: 12/2010.    Anticoagulation Management Assessment/Plan:      The patient's current anticoagulation dose is Coumadin 5 mg tabs: Take as directed by coumadin clinic..  The target INR is 2.0-3.0.  The next INR is due 11/10/2009.  Anticoagulation instructions were given to daughter.  Results were reviewed/authorized by Cloyde Reams, RN, BSN.  He was notified by Cloyde Reams RN.         Prior Anticoagulation Instructions: INR 2.6  Continue same dose of 1/2 tablet every day except 1 tablet on Monday   Current Anticoagulation Instructions: INR 2.8  Continue on same dosage 1/2 tablet daily except 1 tablet on Mondays.  Recheck in 4 weeks.

## 2010-04-17 NOTE — Letter (Signed)
Summary: [facility]   Imported By: Maryln Gottron 08/31/2009 13:24:59  _____________________________________________________________________  External Attachment:    Type:   Image     Comment:   External Document

## 2010-04-17 NOTE — Medication Information (Signed)
Summary: rov/tm  Anticoagulant Therapy  Managed by: Cloyde Reams, RN, BSN Supervising MD: Johney Frame MD, Fayrene Fearing Indication 1: Atrial Fibrillation (ICD-427.31) Lab Used: [facility] [place] Site: [address] INR POC 2.6 INR RANGE 2-3  Dietary changes: no    Health status changes: no    Bleeding/hemorrhagic complications: no    Recent/future hospitalizations: no    Any changes in medication regimen? no    Recent/future dental: no  Any missed doses?: no       Is patient compliant with meds? yes       Allergies: No Known Drug Allergies  Anticoagulation Management History:      The patient is taking warfarin and comes in today for a routine follow up visit.  Positive risk factors for bleeding include an age of 75 years or older.  The bleeding index is 'intermediate risk'.  Positive CHADS2 values include History of HTN and Age > 75 years old.  The start date was 04/07/2008.  His last INR was 5.6 ratio.  Anticoagulation responsible Jarin Cornfield: Allred MD, Fayrene Fearing.  INR POC: 2.6.  Cuvette Lot#: [id number].  Exp: 02/2011.    Anticoagulation Management Assessment/Plan:      The patient's current anticoagulation dose is Coumadin 5 mg tabs: Take as directed by coumadin clinic..  The target INR is 2.0-3.0.  The next INR is due 02/05/2010.  Anticoagulation instructions were given to daughter.  Results were reviewed/authorized by Cloyde Reams, RN, BSN.  He was notified by Cloyde Reams RN.         Prior Anticoagulation Instructions: INR 2.9 Continue 2.5mg s daily except 5mg s on Mondays. Recheck in 4 weeks.   Current Anticoagulation Instructions: INR 2.6  Continue on same dosage 2.5mg  daily except 5mg  on Mondays.  Recheck in 4 weeks.

## 2010-04-17 NOTE — Medication Information (Signed)
Summary: rov/tm  Anticoagulant Therapy  Managed by: Bethena Midget, RN, BSN Supervising MD: Clifton Dajour MD, Cristal Deer Indication 1: Atrial Fibrillation (ICD-427.31) Lab Used: [facility] [place] Site: [address] INR POC 1.6 INR RANGE 2-3  Dietary changes: no    Health status changes: no    Bleeding/hemorrhagic complications: no    Recent/future hospitalizations: no    Any changes in medication regimen? no    Recent/future dental: no  Any missed doses?: yes     Details: missed a dose on 06/07/09  Is patient compliant with meds? yes       Allergies: No Known Drug Allergies  Anticoagulation Management History:      The patient is taking warfarin and comes in today for a routine follow up visit.  Positive risk factors for bleeding include an age of 75 years or older.  The bleeding index is 'intermediate risk'.  Positive CHADS2 values include History of HTN and Age > 33 years old.  The start date was 04/07/2008.  His last INR was 5.6 ratio.  Anticoagulation responsible provider: Clifton Jhonnie MD, Cristal Deer.  INR POC: 1.6.  Cuvette Lot#: [id number].  Exp: 07/2010.    Anticoagulation Management Assessment/Plan:      The patient's current anticoagulation dose is Coumadin 5 mg tabs: Take as directed by coumadin clinic..  The target INR is 2.0-3.0.  The next INR is due 06/22/2009.  Anticoagulation instructions were given to daughter.  Results were reviewed/authorized by Bethena Midget, RN, BSN.  He was notified by Bethena Midget, RN, BSN.         Prior Anticoagulation Instructions: INR 4.3 Skip today's dose then change dose to 1/2 pill everyday. Recheck in 10 days.   Current Anticoagulation Instructions: INR 1.6 Change dose to 1/2 pill everyday except 1 pill on Mondays. Recheck in 10days.

## 2010-04-17 NOTE — Assessment & Plan Note (Signed)
Summary: ear flush/sweeling feet/cjr   Vital Signs:  Patient profile:   77 year old male Weight:      189 pounds Temp:     97.6 degrees F oral BP sitting:   110 / 78  (left arm) Cuff size:   regular  Vitals Entered By: Duard Brady LPN (September 28, 2009 3:49 PM) CC: c/o swelling in feet , not eating, increased sleeping ears clogged  Is Patient Diabetic? No   CC:  c/o swelling in feet , not eating, and increased sleeping ears clogged .  History of Present Illness: 77 year old patient who is seen today for follow-up.  He is had a recent hearing evaluation and was told that he had bilateral cerumen impactions.  He has seen orthopedic slightly due to advanced, arthritis, and knee pain.  He received a cortisone injection with minimal benefit.  He has been evaluated by [facility]  and the arterial lower extremity Dopplers and ABIs revealed moderate disease only.  It was felt that he does not have significant disease to cause symptomatic claudication.  He remains on chronic Coumadin for chronic atrial fibrillation.  There has been some mild episodic pedal edema.  He is on furosemide p.r.n.;  he is on diltiazem for rate control.  He remains on pravastatin for dyslipidemia.  Allergies (verified): No Known Drug Allergies  Past History:  Past Medical History: Reviewed history from 04/19/2009 and no changes required. Atrial flutter Prostate cancer, hx of Hypertension renovascular COPD AAA Bilateral renal stenosis Cardiomyopathy  tachycardia induced improved improved by most recent echo. nonobstructive CAD history of syncope, secondary to atrial flutter, October 2006 mild gait abnormality moderate spinal stenosis atrial fibrillation impaired glucose tolerance cerebrovascular disease cognitive  impairment Osteoarthritis  Past Surgical History: Reviewed history from 07/01/2008 and no changes required. Appendectomy Cholecystectomy Tonsillectomy Fracture R hip, pelvis  Review of  Systems       The patient complains of decreased hearing, peripheral edema, and difficulty walking.  The patient denies anorexia, fever, weight loss, weight gain, vision loss, hoarseness, chest pain, syncope, dyspnea on exertion, prolonged cough, headaches, hemoptysis, abdominal pain, melena, hematochezia, severe indigestion/heartburn, hematuria, incontinence, genital sores, muscle weakness, suspicious skin lesions, transient blindness, depression, unusual weight change, abnormal bleeding, enlarged lymph nodes, angioedema, breast masses, and testicular masses.    Physical Exam  General:  Well-developed,well-nourished,in no acute distress; alert,appropriate and cooperative throughout examination Head:  Normocephalic and atraumatic without obvious abnormalities. No apparent alopecia or balding. Eyes:  No corneal or conjunctival inflammation noted. EOMI. Perrla. Funduscopic exam benign, without hemorrhages, exudates or papilledema. Vision grossly normal. Ears:  bilateral cerumen impactions Mouth:  Oral mucosa and oropharynx without lesions or exudates.  Teeth in good repair. Neck:  No deformities, masses, or tenderness noted. Lungs:  bibasilar crackles Heart:  irregular rhythm, with a controlled ventricular response Abdomen:  Bowel sounds positive,abdomen soft and non-tender without masses, organomegaly or hernias noted. Msk:  No deformity or scoliosis noted of thoracic or lumbar spine.   Pulses:  pedal pulses absent Extremities:  trace left pedal edema and trace right pedal edema.  trace left pedal edema and trace right pedal edema.   Skin:  Intact without suspicious lesions or rashes Psych:  mild to moderate cognitive impairment   Impression & Recommendations:  Problem # 1:  CERUMEN IMPACTION (ICD-380.4) both canals irrigated until clear   Problem # 2:  OSTEOARTHRITIS (ICD-715.90)  His updated medication list for this problem includes:    Adult Aspirin Low Strength 81 Mg Tbdp (Aspirin)  .Marland KitchenMarland KitchenMarland KitchenMarland Kitchen  1 once daily ran out  His updated medication list for this problem includes:    Adult Aspirin Low Strength 81 Mg Tbdp (Aspirin) .Marland Kitchen... 1 once daily ran out  Problem # 3:  FIBRILLATION, ATRIAL (ICD-427.31)  His updated medication list for this problem includes:    Adult Aspirin Low Strength 81 Mg Tbdp (Aspirin) .Marland Kitchen... 1 once daily ran out    Diltiazem Hcl Er Beads 240 Mg Xr24h-cap (Diltiazem hcl er beads) .Marland Kitchen... Take one capsule by mouth daily    Metoprolol Succinate 25 Mg Xr24h-tab (Metoprolol succinate) .Marland Kitchen... 2 tab by mouth once daily    Coumadin 5 Mg Tabs (Warfarin sodium) .Marland Kitchen... Take as directed by coumadin clinic.  His updated medication list for this problem includes:    Adult Aspirin Low Strength 81 Mg Tbdp (Aspirin) .Marland Kitchen... 1 once daily ran out    Diltiazem Hcl Er Beads 240 Mg Xr24h-cap (Diltiazem hcl er beads) .Marland Kitchen... Take one capsule by mouth daily    Metoprolol Succinate 25 Mg Xr24h-tab (Metoprolol succinate) .Marland Kitchen... 2 tab by mouth once daily    Coumadin 5 Mg Tabs (Warfarin sodium) .Marland Kitchen... Take as directed by coumadin clinic.  Problem # 4:  HYPERCHOLESTEROLEMIA-PURE (ICD-272.0)  His updated medication list for this problem includes:    Pravastatin Sodium 40 Mg Tabs (Pravastatin sodium) .Marland Kitchen... Take one tablet by mouth daily at bedtime  His updated medication list for this problem includes:    Pravastatin Sodium 40 Mg Tabs (Pravastatin sodium) .Marland Kitchen... Take one tablet by mouth daily at bedtime  Complete Medication List: 1)  Adult Aspirin Low Strength 81 Mg Tbdp (Aspirin) .Marland Kitchen.. 1 once daily ran out 2)  Pravastatin Sodium 40 Mg Tabs (Pravastatin sodium) .... Take one tablet by mouth daily at bedtime 3)  Diltiazem Hcl Er Beads 240 Mg Xr24h-cap (Diltiazem hcl er beads) .... Take one capsule by mouth daily 4)  Metoprolol Succinate 25 Mg Xr24h-tab (Metoprolol succinate) .... 2 tab by mouth once daily 5)  Coumadin 5 Mg Tabs (Warfarin sodium) .... Take as directed by coumadin clinic. 6)   Furosemide 40 Mg Tabs (Furosemide) .... Take1/2  tablet by mouth daily.  Patient Instructions: 1)  Please schedule a follow-up appointment in 4 months. 2)  Limit your Sodium (Salt).

## 2010-04-17 NOTE — Progress Notes (Signed)
----   Converted from flag ---- ---- 08/29/2009 2:09 PM, Ferman Hamming, MD, [facility] wrote: dc zocor and begin pravachol 40 mg by mouth  daily; lipids and liver in six weeks ---- 08/28/2009 11:35 AM, Weston Brass PharmD wrote: While reviewing pt's med list, noticed they were on simva 40 and diltiazem.  Told pt's daughter we would need to be in touch about changing this ------------------------------  Phone Note Outgoing Call   Call placed by: Deliah Goody, RN,  August 30, 2009 9:33 AM Summary of Call: Left message to call back Deliah Goody, RN  August 30, 2009 9:34 AM   Follow-up for Phone Call        spoke with dtr, aware of med change and need for repeat labs in 6 weeksa Deliah Goody, RN  September 13, 2009 3:11 PM     New/Updated Medications: PRAVASTATIN SODIUM 40 MG TABS (PRAVASTATIN SODIUM) Take one tablet by mouth daily at bedtime Prescriptions: PRAVASTATIN SODIUM 40 MG TABS (PRAVASTATIN SODIUM) Take one tablet by mouth daily at bedtime  #30 x 12   Entered by:   Deliah Goody, RN   Authorized by:   Ferman Hamming, MD, [facility]   Signed by:   Deliah Goody, RN on 09/13/2009   Method used:   Electronically to        [facility] (retail)       [address]       [place], [place]  [address]       Ph: [phone]       Fax: [phone]   RxID:   [id number]

## 2010-04-17 NOTE — Assessment & Plan Note (Signed)
Summary: emp/pt fasting/cjr   Vital Signs:  Patient profile:   77 year old male Weight:      183 pounds Pulse rate:   84 / minute Pulse rhythm:   irregular BP sitting:   140 / 84  (left arm) Cuff size:   regular  Vitals Entered By: Raechel Ache, RN (April 19, 2009 9:02 AM) CC: OV, fasting. Sees cardiologist. CBG Result 119   CC:  OV and fasting. Sees cardiologist..  History of Present Illness:  77 year old patient seen today for extensive evaluation.  He has been evaluated by cardiology recently with recent lab update.  He is accompanied by his daughter and son-in-law.  Complaints include dry mouth, frequent urination, and memory impairment.  He has chronic atrial fibrillation, on chronic Coumadin.  He has hypertension and dyslipidemia.  Additionally, he has a history of prostate cancer, and COPD he is followed for a small abdominal aortic aneurysm.  Allergies: No Known Drug Allergies  Past History:  Past Medical History: Atrial flutter Prostate cancer, hx of Hypertension renovascular COPD AAA Bilateral renal stenosis Cardiomyopathy  tachycardia induced improved improved by most recent echo. nonobstructive CAD history of syncope, secondary to atrial flutter, October 2006 mild gait abnormality moderate spinal stenosis atrial fibrillation impaired glucose tolerance cerebrovascular disease cognitive  impairment Osteoarthritis  Past Surgical History: Reviewed history from 07/01/2008 and no changes required. Appendectomy Cholecystectomy Tonsillectomy Fracture R hip, pelvis  Family History: Reviewed history from 07/01/2008 and no changes required. father died at age 63, myocardial infarction mother died 24 complications of pneumonia One brother died at 37 status post CABG  Social History: Reviewed history from 07/01/2008 and no changes required.  He is widowed x4 years.  He lives in [place].  He is  retired from the [employer].  He has a  history of tobacco use, but  stopped smoking cigarettes many years ago.  Review of Systems       The patient complains of decreased hearing and difficulty walking.  The patient denies anorexia, fever, weight loss, weight gain, vision loss, hoarseness, chest pain, syncope, dyspnea on exertion, peripheral edema, prolonged cough, headaches, hemoptysis, abdominal pain, melena, hematochezia, severe indigestion/heartburn, hematuria, incontinence, genital sores, muscle weakness, suspicious skin lesions, transient blindness, depression, unusual weight change, abnormal bleeding, enlarged lymph nodes, angioedema, breast masses, and testicular masses.    Physical Exam  General:  Well-developed,well-nourished,in no acute distress; alert,appropriate and cooperative throughout examination Head:  Normocephalic and atraumatic without obvious abnormalities. No apparent alopecia or balding. Eyes:  No corneal or conjunctival inflammation noted. EOMI. Perrla. Funduscopic exam benign, without hemorrhages, exudates or papilledema. Vision grossly normal. Ears:  External ear exam shows no significant lesions or deformities.  Otoscopic examination reveals clear canals, tympanic membranes are intact bilaterally without bulging, retraction, inflammation or discharge. Hearing is grossly normal bilaterally. Nose:  External nasal examination shows no deformity or inflammation. Nasal mucosa are pink and moist without lesions or exudates. Mouth:  dentures in place.  No lesions in the Neck:  No deformities, masses, or tenderness noted. decrease right carotid upstroke no bruit Chest Wall:  No deformities, masses, tenderness or gynecomastia noted. surgical scar, left posterior back Breasts:  No masses or gynecomastia noted Lungs:  bibasilar crackles Heart:  irregular rhythm, controlled ventricular response.  No murmur Abdomen:  Bowel sounds positive,abdomen soft and non-tender without masses, organomegaly or hernias  noted.  right upper quadrant and right lower quadrant scars Rectal:  No external abnormalities noted. Normal sphincter tone. No rectal  masses or tenderness. Genitalia:  Testes bilaterally descended without nodularity, tenderness or masses. No scrotal masses or lesions. No penis lesions or urethral discharge. Prostate:  Prostate gland firm and smooth, no enlargement, nodularity, tenderness, mass, asymmetry or induration. Msk:  No deformity or scoliosis noted of thoracic or lumbar spine.   Pulses:  pedal pulses not easily palpable Extremities:  right knee in a brace osteoarthritic changes Neurologic:  moderate cognitive impairment decreased vibratory sensation distal lower extremities unsteady gait   Impression & Recommendations:  Problem # 1:  OSTEOARTHRITIS (ICD-715.90)  His updated medication list for this problem includes:    Adult Aspirin Low Strength 81 Mg Tbdp (Aspirin) .Marland Kitchen... 1 once daily ran out  His updated medication list for this problem includes:    Adult Aspirin Low Strength 81 Mg Tbdp (Aspirin) .Marland Kitchen... 1 once daily ran out  Problem # 2:  HYPERCHOLESTEROLEMIA-PURE (ICD-272.0)  His updated medication list for this problem includes:    Simvastatin 40 Mg Tabs (Simvastatin) .Marland Kitchen... 1 tab by mouth once daily  His updated medication list for this problem includes:    Simvastatin 40 Mg Tabs (Simvastatin) .Marland Kitchen... 1 tab by mouth once daily  Problem # 3:  COPD (ICD-496)  Problem # 4:  HYPERTENSION (ICD-401.9)  His updated medication list for this problem includes:    Diltiazem Hcl Er Beads 240 Mg Xr24h-cap (Diltiazem hcl er beads) .Marland Kitchen... Take one capsule by mouth daily    Metoprolol Succinate 25 Mg Xr24h-tab (Metoprolol succinate) .Marland Kitchen... 2 tab by mouth once daily    Furosemide 40 Mg Tabs (Furosemide) .Marland Kitchen... Take1/2  tablet by mouth daily.  His updated medication list for this problem includes:    Diltiazem Hcl Er Beads 240 Mg Xr24h-cap (Diltiazem hcl er beads) .Marland Kitchen... Take one  capsule by mouth daily    Metoprolol Succinate 25 Mg Xr24h-tab (Metoprolol succinate) .Marland Kitchen... 2 tab by mouth once daily    Furosemide 40 Mg Tabs (Furosemide) .Marland Kitchen... Take1/2  tablet by mouth daily.  Problem # 5:  PROSTATE CANCER, HX OF (ICD-V10.46)  Problem # 6:  MILD COGNITIVE IMPAIRMENT SO STATED (ICD-331.83) will start Aricept 5 mg daily;  consider dose titration.  Next visit if well tolerated  Complete Medication List: 1)  Adult Aspirin Low Strength 81 Mg Tbdp (Aspirin) .Marland Kitchen.. 1 once daily ran out 2)  Simvastatin 40 Mg Tabs (Simvastatin) .Marland Kitchen.. 1 tab by mouth once daily 3)  Diltiazem Hcl Er Beads 240 Mg Xr24h-cap (Diltiazem hcl er beads) .... Take one capsule by mouth daily 4)  Klor-con M20 20 Meq Cr-tabs (Potassium chloride crys cr) .Marland Kitchen.. 1 tablet once daily 5)  Metoprolol Succinate 25 Mg Xr24h-tab (Metoprolol succinate) .... 2 tab by mouth once daily 6)  Coumadin 5 Mg Tabs (Warfarin sodium) .... Take as directed by coumadin clinic. 7)  Furosemide 40 Mg Tabs (Furosemide) .... Take1/2  tablet by mouth daily. 8)  Aricept 5 Mg Tabs (Donepezil hcl) .... One daily  Other Orders: DRE (Z6109) Venipuncture (60454) Capillary Blood Glucose/CBG (82948) TLB-A1C / Hgb A1C (Glycohemoglobin) (83036-A1C) TLB-PSA (Prostate Specific Antigen) (84153-PSA)  Patient Instructions: 1)  Please schedule a follow-up appointment in 3 months. 2)  Limit your Sodium (Salt). 3)  It is important that you exercise regularly at least 20 minutes 5 times a week. If you develop chest pain, have severe difficulty breathing, or feel very tired , stop exercising immediately and seek medical attention. 4)  Continue monthly INR Prescriptions: ARICEPT 5 MG TABS (DONEPEZIL HCL) one daily  #90 x 5  Entered and Authorized by:   Gordy Savers  MD   Signed by:   Gordy Savers  MD on 04/19/2009   Method used:   Print then Give to Patient   RxID:   [id number] FUROSEMIDE 40 MG TABS (FUROSEMIDE) Take1/2  tablet by  mouth daily.  #90 x 4   Entered and Authorized by:   Gordy Savers  MD   Signed by:   Gordy Savers  MD on 04/19/2009   Method used:   Print then Give to Patient   RxID:   [id number] METOPROLOL SUCCINATE 25 MG XR24H-TAB (METOPROLOL SUCCINATE) 2 tab by mouth once daily  #0 x 0   Entered and Authorized by:   Gordy Savers  MD   Signed by:   Gordy Savers  MD on 04/19/2009   Method used:   Print then Give to Patient   RxID:   [id number] SIMVASTATIN 40 MG  TABS (SIMVASTATIN) 1 tab by mouth once daily  #90 Each x 3   Entered and Authorized by:   Gordy Savers  MD   Signed by:   Gordy Savers  MD on 04/19/2009   Method used:   Print then Give to Patient   RxID:   [id number] DILTIAZEM HCL ER BEADS 240 MG XR24H-CAP (DILTIAZEM HCL ER BEADS) Take one capsule by mouth daily  #90 x 12   Entered and Authorized by:   Gordy Savers  MD   Signed by:   Gordy Savers  MD on 04/19/2009   Method used:   Print then Give to Patient   RxID:   [id number] KLOR-CON M20 20 MEQ CR-TABS (POTASSIUM CHLORIDE CRYS CR) 1 tablet once daily  #90 x 0   Entered and Authorized by:   Gordy Savers  MD   Signed by:   Gordy Savers  MD on 04/19/2009   Method used:   Print then Give to Patient   RxID:   [id number]

## 2010-04-17 NOTE — Medication Information (Signed)
Summary: rov/tm  Anticoagulant Therapy  Managed by: Cloyde Reams, RN, BSN Supervising MD: Shirlee Latch MD, Dalton Indication 1: Atrial Fibrillation (ICD-427.31) Lab Used: [facility] [place] Site: [address] INR POC 4.9 INR RANGE 2-3  Dietary changes: yes       Details: Decreased appetite.  Health status changes: yes       Details: C/O chest congestion and runny nose.    Bleeding/hemorrhagic complications: no    Recent/future hospitalizations: no    Any changes in medication regimen? yes       Details: Aricept 5mg  qd.  Recent/future dental: no  Any missed doses?: no       Is patient compliant with meds? yes       Current Medications (verified): 1)  Adult Aspirin Low Strength 81 Mg  Tbdp (Aspirin) .Marland Kitchen.. 1 Once Daily Ran Out 2)  Simvastatin 40 Mg  Tabs (Simvastatin) .Marland Kitchen.. 1 Tab By Mouth Once Daily 3)  Diltiazem Hcl Er Beads 240 Mg Xr24h-Cap (Diltiazem Hcl Er Beads) .... Take One Capsule By Mouth Daily 4)  Klor-Con M20 20 Meq Cr-Tabs (Potassium Chloride Crys Cr) .Marland Kitchen.. 1 Tablet Once Daily 5)  Metoprolol Succinate 25 Mg Xr24h-Tab (Metoprolol Succinate) .... 2 Tab By Mouth Once Daily 6)  Coumadin 5 Mg Tabs (Warfarin Sodium) .... Take As Directed By Coumadin Clinic. 7)  Furosemide 40 Mg Tabs (Furosemide) .... Take1/2  Tablet By Mouth Daily. 8)  Aricept 5 Mg Tabs (Donepezil Hcl) .... One Daily  Allergies (verified): No Known Drug Allergies  Anticoagulation Management History:      The patient is taking warfarin and comes in today for a routine follow up visit.  Positive risk factors for bleeding include an age of 75 years or older.  The bleeding index is 'intermediate risk'.  Positive CHADS2 values include History of HTN and Age > 13 years old.  The start date was 04/07/2008.  His last INR was 5.6 ratio.  Anticoagulation responsible provider: Shirlee Latch MD, Dalton.  INR POC: 4.9.  Cuvette Lot#: [id number].  Exp: 07/2010.    Anticoagulation Management Assessment/Plan:      The  patient's current anticoagulation dose is Coumadin 5 mg tabs: Take as directed by coumadin clinic..  The target INR is 2.0-3.0.  The next INR is due 05/29/2009.  Anticoagulation instructions were given to son-in-law.  Results were reviewed/authorized by Cloyde Reams, RN, BSN.  He was notified by Cloyde Reams RN.         Prior Anticoagulation Instructions: INR 4.9 Skip today's dose and on Thursday take 1/2 pill then resume 1/2 pill everyday except 1 pill on Tuesdays, Thursdays and Sundays. Recheck in 2 weeks.   Current Anticoagulation Instructions: INR 4.9  Skip today and tomorrow then start taking 1/2 tablet daily except 1 tablet on Sundays and Thursdays.  Recheck in 2 weeks.

## 2010-04-17 NOTE — Medication Information (Signed)
Summary: rov/ewj  Anticoagulant Therapy  Managed by: Bethena Midget, RN, BSN Supervising MD: Tenny Craw MD, Gunnar Fusi Indication 1: Atrial Fibrillation (ICD-427.31) Lab Used: [facility] [place] Site: [address] INR POC 4.3 INR RANGE 2-3  Dietary changes: yes       Details: Appetite poor at times  Health status changes: no    Bleeding/hemorrhagic complications: no    Recent/future hospitalizations: no    Any changes in medication regimen? no    Recent/future dental: no  Any missed doses?: yes     Details: on 05/26/09  Is patient compliant with meds? yes      Comments: Had GI issues( diarrhea) on 8,9 and 10th.   Allergies: No Known Drug Allergies  Anticoagulation Management History:      The patient is taking warfarin and comes in today for a routine follow up visit.  Positive risk factors for bleeding include an age of 25 years or older.  The bleeding index is 'intermediate risk'.  Positive CHADS2 values include History of HTN and Age > 85 years old.  The start date was 04/07/2008.  His last INR was 5.6 ratio.  Anticoagulation responsible provider: Tenny Craw MD, Gunnar Fusi.  INR POC: 4.3.  Cuvette Lot#: [id number].  Exp: 07/2010.    Anticoagulation Management Assessment/Plan:      The patient's current anticoagulation dose is Coumadin 5 mg tabs: Take as directed by coumadin clinic..  The target INR is 2.0-3.0.  The next INR is due 06/08/2009.  Anticoagulation instructions were given to daughter.  Results were reviewed/authorized by Bethena Midget, RN, BSN.  He was notified by Bethena Midget, RN, BSN.         Prior Anticoagulation Instructions: INR 4.9  Skip today and tomorrow then start taking 1/2 tablet daily except 1 tablet on Sundays and Thursdays.  Recheck in 2 weeks.    Current Anticoagulation Instructions: INR 4.3 Skip today's dose then change dose to 1/2 pill everyday. Recheck in 10 days.

## 2010-04-17 NOTE — Medication Information (Signed)
Summary: rov/tm  Anticoagulant Therapy  Managed by: Bethena Midget, RN, BSN Supervising MD: Jens Som MD, Arlys John Indication 1: Atrial Fibrillation (ICD-427.31) Lab Used: [facility] [place] Site: [address] INR POC 1.9 INR RANGE 2-3  Dietary changes: yes       Details: Appetite poor  Health status changes: no    Bleeding/hemorrhagic complications: no    Recent/future hospitalizations: no    Any changes in medication regimen? no    Recent/future dental: no  Any missed doses?: no       Is patient compliant with meds? yes       Allergies: No Known Drug Allergies  Anticoagulation Management History:      The patient is taking warfarin and comes in today for a routine follow up visit.  Positive risk factors for bleeding include an age of 75 years or older.  The bleeding index is 'intermediate risk'.  Positive CHADS2 values include History of HTN and Age > 23 years old.  The start date was 04/07/2008.  His last INR was 5.6 ratio.  Anticoagulation responsible provider: Jens Som MD, Arlys John.  INR POC: 1.9.  Cuvette Lot#: [id number].  Exp: 08/2010.    Anticoagulation Management Assessment/Plan:      The patient's current anticoagulation dose is Coumadin 5 mg tabs: Take as directed by coumadin clinic..  The target INR is 2.0-3.0.  The next INR is due 08/28/2009.  Anticoagulation instructions were given to daughter.  Results were reviewed/authorized by Bethena Midget, RN, BSN.  He was notified by Bethena Midget, RN, BSN.         Prior Anticoagulation Instructions: INR 2.5 Continue 2.5mg s everyday except 5mg s on Mondays. Recheck in 3 weeks.   Current Anticoagulation Instructions: INR 1.9 Tomorrow take 1 pill, continue 1/2 pill everyday except 1 pill on Mondays. Recheck in 3 weeks.

## 2010-04-17 NOTE — Medication Information (Signed)
Summary: rov.mp  Anticoagulant Therapy  Managed by: Cloyde Reams, RN, BSN Supervising MD: Excell Seltzer MD, Casimiro Needle Indication 1: Atrial Fibrillation (ICD-427.31) Lab Used: [facility] [place] Site: [address] INR POC 2.3 INR RANGE 2-3  Dietary changes: yes       Details: Decr appetite, decr salad intake.  Health status changes: no    Bleeding/hemorrhagic complications: no    Recent/future hospitalizations: no    Any changes in medication regimen? no    Recent/future dental: no  Any missed doses?: yes     Details: Missed 1 dose last week.  Is patient compliant with meds? yes       Allergies (verified): No Known Drug Allergies  Anticoagulation Management History:      The patient is taking warfarin and comes in today for a routine follow up visit.  Positive risk factors for bleeding include an age of 75 years or older.  The bleeding index is 'intermediate risk'.  Positive CHADS2 values include History of HTN and Age > 75 years old.  The start date was 04/07/2008.  His last INR was 5.6 ratio.  Anticoagulation responsible provider: Excell Seltzer MD, Casimiro Needle.  INR POC: 2.3.  Exp: 04/2010.    Anticoagulation Management Assessment/Plan:      The patient's current anticoagulation dose is Coumadin 5 mg tabs: Take as directed by coumadin clinic..  The target INR is 2.0-3.0.  The next INR is due 05/09/2009.  Anticoagulation instructions were given to son-in-law.  Results were reviewed/authorized by Cloyde Reams, RN, BSN.  He was notified by Cloyde Reams RN.         Prior Anticoagulation Instructions: INR 3.8  Skip 1 day then 0.5 tab Monday, Wednesday, Friday, Saturday and 1 tab on Sunday, Tuesday, Thursday.  Recheck in 2 weeks.    Current Anticoagulation Instructions: INR 2.3  Continue on same dosage 1/2 tablet daily except 1 tablet on Sundays, Tuesdays, and Thursdays.  Recheck in 4 weeks.

## 2010-04-17 NOTE — Medication Information (Signed)
Summary: [name]  Anticoagulant Therapy  Managed by: Bethena Midget, RN, BSN Supervising MD: Johney Frame MD, Fayrene Fearing Indication 1: Atrial Fibrillation (ICD-427.31) Lab Used: [facility] [place] Site: [address] INR POC 4.9 INR RANGE 2-3  Dietary changes: no    Health status changes: no    Bleeding/hemorrhagic complications: no    Recent/future hospitalizations: no    Any changes in medication regimen? no    Recent/future dental: no  Any missed doses?: no       Is patient compliant with meds? yes       Allergies: No Known Drug Allergies  Anticoagulation Management History:      The patient is taking warfarin and comes in today for a routine follow up visit.  Positive risk factors for bleeding include an age of 75 years or older.  The bleeding index is 'intermediate risk'.  Positive CHADS2 values include History of HTN and Age > 38 years old.  The start date was 04/07/2008.  His last INR was 5.6 ratio.  Anticoagulation responsible provider: Daemien Fronczak MD, Fayrene Fearing.  INR POC: 4.9.  Cuvette Lot#: [id number].  Exp: 06/2010.    Anticoagulation Management Assessment/Plan:      The patient's current anticoagulation dose is Coumadin 5 mg tabs: Take as directed by coumadin clinic..  The target INR is 2.0-3.0.  The next INR is due 05/15/2009.  Anticoagulation instructions were given to son-in-law.  Results were reviewed/authorized by Bethena Midget, RN, BSN.  He was notified by Bethena Midget, RN, BSN.         Prior Anticoagulation Instructions: INR 2.3  Continue on same dosage 1/2 tablet daily except 1 tablet on Sundays, Tuesdays, and Thursdays.  Recheck in 4 weeks.    Current Anticoagulation Instructions: INR 4.9 Skip today's dose and on Thursday take 1/2 pill then resume 1/2 pill everyday except 1 pill on Tuesdays, Thursdays and Sundays. Recheck in 2 weeks.

## 2010-04-17 NOTE — Medication Information (Signed)
Summary: rov/tm  Anticoagulant Therapy  Managed by: Weston Brass, PharmD Supervising MD: Antoine Poche MD, Fayrene Fearing Indication 1: Atrial Fibrillation (ICD-427.31) Lab Used: [facility] [place] Site: [address] INR POC 2.6 INR RANGE 2-3  Dietary changes: no    Health status changes: no    Bleeding/hemorrhagic complications: no    Recent/future hospitalizations: no    Any changes in medication regimen? yes       Details: stopped Aricept  Recent/future dental: no  Any missed doses?: no       Is patient compliant with meds? yes       Current Medications (verified): 1)  Adult Aspirin Low Strength 81 Mg  Tbdp (Aspirin) .Marland Kitchen.. 1 Once Daily Ran Out 2)  Simvastatin 40 Mg  Tabs (Simvastatin) .Marland Kitchen.. 1 Tab By Mouth Once Daily 3)  Diltiazem Hcl Er Beads 240 Mg Xr24h-Cap (Diltiazem Hcl Er Beads) .... Take One Capsule By Mouth Daily 4)  Klor-Con M20 20 Meq Cr-Tabs (Potassium Chloride Crys Cr) .Marland Kitchen.. 1 Tablet Once Daily 5)  Metoprolol Succinate 25 Mg Xr24h-Tab (Metoprolol Succinate) .... 2 Tab By Mouth Once Daily 6)  Coumadin 5 Mg Tabs (Warfarin Sodium) .... Take As Directed By Coumadin Clinic. 7)  Furosemide 40 Mg Tabs (Furosemide) .... Take1/2  Tablet By Mouth Daily.  Allergies: No Known Drug Allergies  Anticoagulation Management History:      The patient is taking warfarin and comes in today for a routine follow up visit.  Positive risk factors for bleeding include an age of 75 years or older.  The bleeding index is 'intermediate risk'.  Positive CHADS2 values include History of HTN and Age > 11 years old.  The start date was 04/07/2008.  His last INR was 5.6 ratio.  Anticoagulation responsible provider: Antoine Poche MD, Fayrene Fearing.  INR POC: 2.6.  Cuvette Lot#: [id number].  Exp: 10/2010.    Anticoagulation Management Assessment/Plan:      The patient's current anticoagulation dose is Coumadin 5 mg tabs: Take as directed by coumadin clinic..  The target INR is 2.0-3.0.  The next INR is due  09/25/2009.  Anticoagulation instructions were given to daughter.  Results were reviewed/authorized by Weston Brass, PharmD.  He was notified by Weston Brass PharmD.         Prior Anticoagulation Instructions: INR 1.9 Tomorrow take 1 pill, continue 1/2 pill everyday except 1 pill on Mondays. Recheck in 3 weeks.   Current Anticoagulation Instructions: INR 2.6  Continue same dose of 1/2 tablet every day except 1 tablet on Monday

## 2010-04-17 NOTE — Medication Information (Signed)
Summary: rov/tm  Anticoagulant Therapy  Managed by: Elaina Pattee, PharmD Supervising MD: Clifton Sartaj MD, Cristal Deer Indication 1: Atrial Fibrillation (ICD-427.31) Lab Used: [facility] [place] Site: [address] INR POC 1.8 INR RANGE 2-3  Dietary changes: no    Health status changes: yes       Details: Dizziness on 4/7 and 4/8. BP ok on those days (SBP 140)  Bleeding/hemorrhagic complications: yes       Details: Several small bruises but no other bleeding.  Recent/future hospitalizations: no    Any changes in medication regimen? no    Recent/future dental: no  Any missed doses?: yes     Details: Missed dose 06/17/09.  Is patient compliant with meds? yes       Allergies: No Known Drug Allergies  Anticoagulation Management History:      The patient is taking warfarin and comes in today for a routine follow up visit.  Positive risk factors for bleeding include an age of 75 years or older.  The bleeding index is 'intermediate risk'.  Positive CHADS2 values include History of HTN and Age > 86 years old.  The start date was 04/07/2008.  His last INR was 5.6 ratio.  Anticoagulation responsible provider: Clifton Londen MD, Cristal Deer.  INR POC: 1.8.  Cuvette Lot#: [id number].  Exp: 07/2010.    Anticoagulation Management Assessment/Plan:      The patient's current anticoagulation dose is Coumadin 5 mg tabs: Take as directed by coumadin clinic..  The target INR is 2.0-3.0.  The next INR is due 07/10/2009.  Anticoagulation instructions were given to daughter.  Results were reviewed/authorized by Elaina Pattee, PharmD.  He was notified by Elaina Pattee, PharmD.         Prior Anticoagulation Instructions: INR 1.6 Change dose to 1/2 pill everyday except 1 pill on Mondays. Recheck in 10days.   Current Anticoagulation Instructions: INR 1.8. Take 1.5 tablets today, then take 0.5 tablet daily except 1 tablet on Mon and Fri. Recheck in 10-14 days.

## 2010-04-17 NOTE — Medication Information (Signed)
Summary: ccr  Anticoagulant Therapy  Managed by: Bethena Midget, RN, BSN Supervising MD: Excell Seltzer MD, Casimiro Needle Indication 1: Atrial Fibrillation (ICD-427.31) Lab Used: [facility] [place] Site: [address] INR POC 2.9 INR RANGE 2-3  Dietary changes: no    Health status changes: no    Bleeding/hemorrhagic complications: no    Recent/future hospitalizations: no    Any changes in medication regimen? no    Recent/future dental: no  Any missed doses?: no       Is patient compliant with meds? yes       Allergies: No Known Drug Allergies  Anticoagulation Management History:      The patient is taking warfarin and comes in today for a routine follow up visit.  Positive risk factors for bleeding include an age of 75 years or older.  The bleeding index is 'intermediate risk'.  Positive CHADS2 values include History of HTN and Age > 22 years old.  The start date was 04/07/2008.  His last INR was 5.6 ratio.  Anticoagulation responsible provider: Excell Seltzer MD, Casimiro Needle.  INR POC: 2.9.  Cuvette Lot#: [id number].  Exp: 01/2011.    Anticoagulation Management Assessment/Plan:      The patient's current anticoagulation dose is Coumadin 5 mg tabs: Take as directed by coumadin clinic..  The target INR is 2.0-3.0.  The next INR is due 01/08/2010.  Anticoagulation instructions were given to daughter.  Results were reviewed/authorized by Bethena Midget, RN, BSN.  He was notified by Bethena Midget, RN, BSN.         Prior Anticoagulation Instructions: INR 2.8  Continue on same dosage 1/2 tablet daily except 1 tablet on Mondays.  Recheck in 4 weeks.    Current Anticoagulation Instructions: INR 2.9 Continue 2.5mg s daily except 5mg s on Mondays. Recheck in 4 weeks.

## 2010-04-19 NOTE — Medication Information (Signed)
Summary: ccr  Anticoagulant Therapy  Managed by: Bethena Midget, RN, BSN Supervising MD: Riley Kill MD, Maisie Fus Indication 1: Atrial Fibrillation (ICD-427.31) Lab Used: [facility]  Site: [address] INR POC 1.0 INR RANGE 2-3  Dietary changes: no    Health status changes: no    Bleeding/hemorrhagic complications: no    Recent/future hospitalizations: no    Any changes in medication regimen? no    Recent/future dental: no  Any missed doses?: yes     Details: possible miss on 12/22  Is patient compliant with meds? yes      0  Allergies: No Known Drug Allergies  Anticoagulation Management History:      The patient is taking warfarin and comes in today for a routine follow up visit.  Positive risk factors for bleeding include an age of 75 years or older.  The bleeding index is 'intermediate risk'.  Positive CHADS2 values include History of HTN and Age > 26 years old.  The start date was 04/07/2008.  His last INR was 5.6 ratio.  Anticoagulation responsible provider: Riley Kill MD, Maisie Fus.  INR POC: 1.0.  Cuvette Lot#: [id number].  Exp: 04/2011.    Anticoagulation Management Assessment/Plan:      The patient's current anticoagulation dose is Coumadin 5 mg tabs: Take as directed by coumadin clinic..  The target INR is 2.0-3.0.  The next INR is due 03/23/2010.  Anticoagulation instructions were given to daughter.  Results were reviewed/authorized by Bethena Midget, RN, BSN.  He was notified by Bethena Midget, RN, BSN.         Prior Anticoagulation Instructions: INR 2.6  Continue on same dosage 2.5mg  daily except 5mg  on Mondays.  Recheck in 4 weeks.    Current Anticoagulation Instructions: INR 1.0 Today and Thursday take 1 pill then resume 1/2 pill everyday except on Mondays take 1 pill. Recheck in 7-10 days.

## 2010-04-19 NOTE — Assessment & Plan Note (Signed)
Summary: depression, spots on neck//ccm   Vital Signs:  Patient profile:   77 year old male Weight:      188 pounds Temp:     97.6 degrees F oral BP sitting:   140 / 86  (right arm) Cuff size:   regular  Vitals Entered By: Duard Brady LPN (April 05, 2010 8:33 AM) CC: c/o feet swelling, concerns about place on back - family worried about dementia and depression Is Patient Diabetic? No   CC:  c/o feet swelling and concerns about place on back - family worried about dementia and depression.  History of Present Illness: 77 year old patient who is seen today for follow-up.  He is followed by cardiology for chronic atrial fibrillation, and INR was checked two weeks ago, and was nicely therapeutic.  Complains today include worsening pedal edema.  He is on low dose furosemide.  He does have a history of COPD and cardiomyopathy.  He has carotid artery and renal vascular stenosis.  His blood pressure has been stable.  He is accounted by his daughter, who is concerned about worsening depression.  He denies any chest pain.  He does have some mild dyspnea on exertion.  Allergies (verified): No Known Drug Allergies  Past History:  Past Medical History: Reviewed history from 04/19/2009 and no changes required. Atrial flutter Prostate cancer, hx of Hypertension renovascular COPD AAA Bilateral renal stenosis Cardiomyopathy  tachycardia induced improved improved by most recent echo. nonobstructive CAD history of syncope, secondary to atrial flutter, October 2006 mild gait abnormality moderate spinal stenosis atrial fibrillation impaired glucose tolerance cerebrovascular disease cognitive  impairment Osteoarthritis  Past Surgical History: Reviewed history from 07/01/2008 and no changes required. Appendectomy Cholecystectomy Tonsillectomy Fracture R hip, pelvis  Family History: Reviewed history from 07/01/2008 and no changes required. father died at age 55, myocardial  infarction mother died 71 complications of pneumonia One brother died at 63 status post CABG  Social History: Reviewed history from 03/22/2010 and no changes required.  He is widowed x4 years.  He lives in [place].  He is  retired from the [employer].  He has a history of tobacco use, but stopped smoking cigarettes many years ago. presently resides with his daughter  Review of Systems       The patient complains of decreased hearing, dyspnea on exertion, peripheral edema, and depression.  The patient denies anorexia, fever, weight loss, weight gain, vision loss, hoarseness, chest pain, syncope, prolonged cough, headaches, hemoptysis, abdominal pain, melena, hematochezia, severe indigestion/heartburn, hematuria, incontinence, genital sores, muscle weakness, suspicious skin lesions, transient blindness, difficulty walking, unusual weight change, abnormal bleeding, enlarged lymph nodes, angioedema, breast masses, and testicular masses.    Physical Exam  General:  Well-developed,well-nourished,in no acute distress; alert,appropriate and cooperative throughout examination Head:  Normocephalic and atraumatic without obvious abnormalities. No apparent alopecia or balding. Eyes:  No corneal or conjunctival inflammation noted. EOMI. Perrla. Funduscopic exam benign, without hemorrhages, exudates or papilledema. Vision grossly normal. Ears:  cerumen left canal Mouth:  Oral mucosa and oropharynx without lesions or exudates.  Teeth in good repair. Neck:  No deformities, masses, or tenderness noted. Chest Wall:  thoracotomy scar present on the left Lungs:  few bibasilar crackles Heart:  irregular rhythm, with a controlled ventricular response Abdomen:  Bowel sounds positive,abdomen soft and non-tender without masses, organomegaly or hernias noted. Msk:  No deformity or scoliosis noted of thoracic or lumbar spine.   Pulses:  pedal pulses absent Extremities:  trace left pedal edema  and 2+  right pedal edema.  trace left pedal edema and 2+ right pedal edema.   Skin:  Intact without suspicious lesions or rashes Cervical Nodes:  No lymphadenopathy noted Axillary Nodes:  No palpable lymphadenopathy Inguinal Nodes:  No significant adenopathy Psych:  Cognition and judgment appear intact. Alert and cooperative with normal attention span and concentration. No apparent delusions, illusions, hallucinations   Impression & Recommendations:  Problem # 1:  FIBRILLATION, ATRIAL (ICD-427.31)  His updated medication list for this problem includes:    Adult Aspirin Low Strength 81 Mg Tbdp (Aspirin) .Marland Kitchen... 1 once daily    Diltiazem Hcl Er Beads 240 Mg Xr24h-cap (Diltiazem hcl er beads) .Marland Kitchen... Take one capsule by mouth daily    Metoprolol Succinate 25 Mg Xr24h-tab (Metoprolol succinate) .Marland Kitchen... 2 tab by mouth once daily    Coumadin 5 Mg Tabs (Warfarin sodium) .Marland Kitchen... Take as directed by coumadin clinic.    His updated medication list for this problem includes:    Adult Aspirin Low Strength 81 Mg Tbdp (Aspirin) .Marland Kitchen... 1 once daily    Diltiazem Hcl Er Beads 240 Mg Xr24h-cap (Diltiazem hcl er beads) .Marland Kitchen... Take one capsule by mouth daily    Metoprolol Succinate 25 Mg Xr24h-tab (Metoprolol succinate) .Marland Kitchen... 2 tab by mouth once daily    Coumadin 5 Mg Tabs (Warfarin sodium) .Marland Kitchen... Take as directed by coumadin clinic.  Orders: Venipuncture (04540) TLB-BMP (Basic Metabolic Panel-BMET) (80048-METABOL) TLB-CBC Platelet - w/Differential (85025-CBCD) TLB-Hepatic/Liver Function Pnl (80076-HEPATIC) TLB-TSH (Thyroid Stimulating Hormone) (84443-TSH) Specimen Handling (98119)  Problem # 2:  RENAL ARTERY STENOSIS (ICD-440.1)  Problem # 3:  CARDIOMYOPATHY (ICD-425.4)  Problem # 4:  HYPERTENSION (ICD-401.9)  His updated medication list for this problem includes:    Diltiazem Hcl Er Beads 240 Mg Xr24h-cap (Diltiazem hcl er beads) .Marland Kitchen... Take one capsule by mouth daily    Metoprolol Succinate 25 Mg Xr24h-tab  (Metoprolol succinate) .Marland Kitchen... 2 tab by mouth once daily    Furosemide 40 Mg Tabs (Furosemide) .Marland Kitchen... Take1/2  tablet by mouth daily.    His updated medication list for this problem includes:    Diltiazem Hcl Er Beads 240 Mg Xr24h-cap (Diltiazem hcl er beads) .Marland Kitchen... Take one capsule by mouth daily    Metoprolol Succinate 25 Mg Xr24h-tab (Metoprolol succinate) .Marland Kitchen... 2 tab by mouth once daily    Furosemide 40 Mg Tabs (Furosemide) .Marland Kitchen... Take1/2  tablet by mouth daily.  Orders: TLB-BMP (Basic Metabolic Panel-BMET) (80048-METABOL) TLB-CBC Platelet - w/Differential (85025-CBCD) TLB-Hepatic/Liver Function Pnl (80076-HEPATIC) Specimen Handling (14782)  Problem # 5:  PROSTATE CANCER, HX OF (ICD-V10.46)  Orders: TLB-PSA (Prostate Specific Antigen) (84153-PSA) Specimen Handling (95621)  Complete Medication List: 1)  Adult Aspirin Low Strength 81 Mg Tbdp (Aspirin) .Marland Kitchen.. 1 once daily 2)  Pravastatin Sodium 40 Mg Tabs (Pravastatin sodium) .... Take one tablet by mouth daily at bedtime 3)  Diltiazem Hcl Er Beads 240 Mg Xr24h-cap (Diltiazem hcl er beads) .... Take one capsule by mouth daily 4)  Metoprolol Succinate 25 Mg Xr24h-tab (Metoprolol succinate) .... 2 tab by mouth once daily 5)  Coumadin 5 Mg Tabs (Warfarin sodium) .... Take as directed by coumadin clinic. 6)  Furosemide 40 Mg Tabs (Furosemide) .... Take1/2  tablet by mouth daily. 7)  Klor-con M20 20 Meq Cr-tabs (Potassium chloride crys cr) .... Qd 8)  Sertraline Hcl 25 Mg Tabs (Sertraline hcl) .... One every morning  Other Orders: TLB-Lipid Panel (80061-LIPID)  Patient Instructions: 1)  Please schedule a follow-up appointment in 6 months. 2)  Limit  your Sodium (Salt). 3)  It is important that you exercise regularly at least 20 minutes 5 times a week. If you develop chest pain, have severe difficulty breathing, or feel very tired , stop exercising immediately and seek medical attention. Prescriptions: SERTRALINE HCL 25 MG TABS (SERTRALINE  HCL) one every morning  #90 x 4   Entered and Authorized by:   Gordy Savers  MD   Signed by:   Gordy Savers  MD on 04/05/2010   Method used:   Electronically to        [facility] (retail)       [address]       [place], [place]  [address]       Ph: [phone]       Fax: [phone]   RxID:   [id number] KLOR-CON M20 20 MEQ CR-TABS (POTASSIUM CHLORIDE CRYS CR) qd  #90 x 6   Entered and Authorized by:   Gordy Savers  MD   Signed by:   Gordy Savers  MD on 04/05/2010   Method used:   Electronically to        [facility] (retail)       [address]       [place], [place]  [address]       Ph: [phone]       Fax: [phone]   RxID:   [id number] FUROSEMIDE 40 MG TABS (FUROSEMIDE) Take1/2  tablet by mouth daily.  #90 x 4   Entered and Authorized by:   Gordy Savers  MD   Signed by:   Gordy Savers  MD on 04/05/2010   Method used:   Electronically to        [facility] (retail)       [address]       [place], [place]  [address]       Ph: [phone]       Fax: [phone]   RxID:   [id number] COUMADIN 5 MG TABS (WARFARIN SODIUM) Take as directed by coumadin clinic.  #30 Each x 2   Entered and Authorized by:   Gordy Savers  MD   Signed by:   Gordy Savers  MD on 04/05/2010   Method used:   Electronically to        [facility] (retail)       [address]       [place], [place]  [address]       Ph: [phone]       Fax: [phone]   RxID:   [id number] METOPROLOL SUCCINATE 25 MG XR24H-TAB (METOPROLOL SUCCINATE) 2 tab by mouth once daily  #90 x 4   Entered and Authorized by:   Gordy Savers  MD   Signed by:   Gordy Savers  MD on 04/05/2010   Method used:   Electronically to        [facility] (retail)       [address]       [place], [place]  [address]       Ph: [phone]       Fax: [phone]   RxID:  [id number] DILTIAZEM HCL ER BEADS 240 MG XR24H-CAP (DILTIAZEM HCL ER BEADS) Take one capsule by mouth daily  #90 x 12   Entered and Authorized by:   Gordy Savers  MD   Signed by:   Gordy Savers  MD on 04/05/2010   Method used:   Electronically to        [facility] (retail)       [address]       [place], [place]  [address]       Ph: [phone]       Fax: [phone]   RxID:   [id number] PRAVASTATIN SODIUM 40 MG TABS (PRAVASTATIN SODIUM) Take one tablet by mouth daily at bedtime  #90 x 12   Entered and Authorized by:   Gordy Savers  MD   Signed by:   Gordy Savers  MD on 04/05/2010   Method used:   Electronically to        [facility] (retail)       [address]       [place], [place]  [address]       Ph: [phone]       Fax: [phone]   RxID:   [id number]    Orders Added: 1)  Est. Patient Level IV [22025] 2)  Venipuncture [42706] 3)  TLB-Lipid Panel [80061-LIPID] 4)  TLB-BMP (Basic Metabolic Panel-BMET) [80048-METABOL] 5)  TLB-CBC Platelet - w/Differential [85025-CBCD] 6)  TLB-Hepatic/Liver Function Pnl [80076-HEPATIC] 7)  TLB-TSH (Thyroid Stimulating Hormone) [84443-TSH] 8)  TLB-PSA (Prostate Specific Antigen) [23762-GBT] 9)  Specimen Handling [99000]

## 2010-04-19 NOTE — Medication Information (Signed)
Summary: rov/tm/appt. with crenshaw at 2:30/sl  Anticoagulant Therapy  Managed by: Louann Sjogren, PharmD Supervising MD: Myrtis Ser MD,Riley Hallum Indication 1: Atrial Fibrillation (ICD-427.31) Lab Used: [facility] [place] Site: [address] INR POC 2.3 INR RANGE 2-3  Dietary changes: no    Health status changes: no    Bleeding/hemorrhagic complications: no    Recent/future hospitalizations: no    Any changes in medication regimen? no    Recent/future dental: no  Any missed doses?: no       Is patient compliant with meds? yes       Allergies: No Known Drug Allergies  Anticoagulation Management History:      The patient is taking warfarin and comes in today for a routine follow up visit.  Positive risk factors for bleeding include an age of 75 years or older.  The bleeding index is 'intermediate risk'.  Positive CHADS2 values include History of HTN and Age > 23 years old.  The start date was 04/07/2008.  His last INR was 5.6 ratio and today's INR is 2.3.  Anticoagulation responsible provider: Myrtis Ser MD,Cruz Devilla.  INR POC: 2.3.  Cuvette Lot#: [id number].  Exp: 04/2011.    Anticoagulation Management Assessment/Plan:      The patient's current anticoagulation dose is Coumadin 5 mg tabs: Take as directed by coumadin clinic..  The target INR is 2.0-3.0.  The next INR is due 04/19/2010.  Anticoagulation instructions were given to daughter.  Results were reviewed/authorized by Louann Sjogren, PharmD.         Prior Anticoagulation Instructions: INR 1.0 Today and Thursday take 1 pill then resume 1/2 pill everyday except on Mondays take 1 pill. Recheck in 7-10 days.   Current Anticoagulation Instructions: INR 2.3 (goal is 2-3)  Continue current regimen of 1/2 tablet daily except take 1 tablet on Mondays.

## 2010-04-19 NOTE — Assessment & Plan Note (Signed)
Summary: rov ok per debra/coumadin also/sl   History of Present Illness: [name] is a pleasant  gentleman with hypertension, history of atrial flutter ablation, and vascular disease.  He also has atrial fibrillation that we are treating with rate control and anticoagulation.  Previous cardiac catheterization in October 2006 showed nonobstructive coronary disease and an ejection fraction of 55%. There was a 90% left and 75% right renal artery stenosis. He had previous stenting of his left renal artery in October of 2007. We did repeat his echocardiogram on April 11, 2008. His LV function was normal.  There was mild mitral regurgitation.  The left atrium was moderately dilated.  The right was moderately to markedly dilated.  The right ventricle was mildly dilated.  His TSH was normal. A Holter monitor in February of 2010 showed that his rate was not adequately controlled and we added Toprol 25 mg p.o. daily. This has been increased for elevated heart rate and Cardizem added. Carotid Dopplers in February of 2011 showed 0-39% stenosis bilaterally. Last abdominal ultrasound in Feb 2011 showed AAA of  3.8 x 3.9 cm. I last saw him in December of 2010. Since then, the patient has dyspnea with more extreme activities but not with routine activities. It is relieved with rest. It is not associated with chest pain. There is no orthopnea, PND or pedal edema. There is no syncope or palpitations. There is no exertional chest pain. He is relatively inactive because of pain in his knee. He has not fallen. There is no bleeding.   Current Medications (verified): 1)  Adult Aspirin Low Strength 81 Mg  Tbdp (Aspirin) .Marland Kitchen.. 1 Once Daily 2)  Pravastatin Sodium 40 Mg Tabs (Pravastatin Sodium) .... Take One Tablet By Mouth Daily At Bedtime 3)  Diltiazem Hcl Er Beads 240 Mg Xr24h-Cap (Diltiazem Hcl Er Beads) .... Take One Capsule By Mouth Daily 4)  Metoprolol Succinate 25 Mg Xr24h-Tab (Metoprolol Succinate) .... 2 Tab By Mouth  Once Daily 5)  Coumadin 5 Mg Tabs (Warfarin Sodium) .... Take As Directed By Coumadin Clinic. 6)  Furosemide 40 Mg Tabs (Furosemide) .... Take1/2  Tablet By Mouth Daily.  Allergies: No Known Drug Allergies  Past History:  Past Medical History: Reviewed history from 04/19/2009 and no changes required. Atrial flutter Prostate cancer, hx of Hypertension renovascular COPD AAA Bilateral renal stenosis Cardiomyopathy  tachycardia induced improved improved by most recent echo. nonobstructive CAD history of syncope, secondary to atrial flutter, October 2006 mild gait abnormality moderate spinal stenosis atrial fibrillation impaired glucose tolerance cerebrovascular disease cognitive  impairment Osteoarthritis  Past Surgical History: Reviewed history from 07/01/2008 and no changes required. Appendectomy Cholecystectomy Tonsillectomy Fracture R hip, pelvis  Social History: Reviewed history from 07/01/2008 and no changes required.  He is widowed x4 years.  He lives in [place].  He is  retired from the [employer].  He has a history of tobacco use, but stopped smoking cigarettes many years ago.  Review of Systems       Pain in knee but no fevers or chills, productive cough, hemoptysis, dysphasia, odynophagia, melena, hematochezia, dysuria, hematuria, rash, seizure activity, orthopnea, PND, pedal edema, claudication. Remaining systems are negative.   Vital Signs:  Patient profile:   77 year old male Height:      74 inches Weight:      189 pounds BMI:     24.35 Pulse rate:   90 / minute Resp:     14 per minute BP sitting:   132 /  90  (left arm)  Vitals Entered By: Kem Parkinson (March 22, 2010 3:06 PM)  Physical Exam  General:  Well-developed well-nourished in no acute distress.  Skin is warm and dry.  HEENT is normal.  Neck is supple. No thyromegaly.  Chest is clear to auscultation with normal expansion.  Cardiovascular exam is irregular  Abdominal  exam nontender or distended. No masses palpated. Extremities show no edema. neuro grossly intact    EKG  Procedure date:  03/22/2010  Findings:      Atrial fibrillation at a rate 93. Occasional PVC or aberrantly conducted beats. Left axis deviation. Prior septal infarct. Nonspecific ST changes.  Impression & Recommendations:  Problem # 1:  COUMADIN THERAPY (ICD-V58.61) Goal INR 2-3. Check CBC.  Problem # 2:  HYPERCHOLESTEROLEMIA-PURE (ICD-272.0) Continue statin. Check lipids and liver. His updated medication list for this problem includes:    Pravastatin Sodium 40 Mg Tabs (Pravastatin sodium) .Marland Kitchen... Take one tablet by mouth daily at bedtime  Problem # 3:  CAROTID ARTERY STENOSIS (ICD-433.10) Continue aspirin and statin. Followup carotid Dopplers February 2012. His updated medication list for this problem includes:    Adult Aspirin Low Strength 81 Mg Tbdp (Aspirin) .Marland Kitchen... 1 once daily    Coumadin 5 Mg Tabs (Warfarin sodium) .Marland Kitchen... Take as directed by coumadin clinic.  Problem # 4:  FIBRILLATION, ATRIAL (ICD-427.31)  Patient remains in atrial fibrillation. Continue Cardizem, beta blocker and Coumadin. His updated medication list for this problem includes:    Adult Aspirin Low Strength 81 Mg Tbdp (Aspirin) .Marland Kitchen... 1 once daily    Metoprolol Succinate 25 Mg Xr24h-tab (Metoprolol succinate) .Marland Kitchen... 2 tab by mouth once daily    Coumadin 5 Mg Tabs (Warfarin sodium) .Marland Kitchen... Take as directed by coumadin clinic.  His updated medication list for this problem includes:    Adult Aspirin Low Strength 81 Mg Tbdp (Aspirin) .Marland Kitchen... 1 once daily    Metoprolol Succinate 25 Mg Xr24h-tab (Metoprolol succinate) .Marland Kitchen... 2 tab by mouth once daily    Coumadin 5 Mg Tabs (Warfarin sodium) .Marland Kitchen... Take as directed by coumadin clinic.  Problem # 5:  RENAL ARTERY STENOSIS (ICD-440.1)  Continue aspirin and statin. Followup renal Dopplers.  Orders: Renal Artery Duplex (Renal Artery Duplex)  Problem # 6:  ANEURYSM,  ABDOMINAL AORTIC (ICD-441.4) Repeat abdominal ultrasound.  Problem # 7:  HYPERTENSION (ICD-401.9)  Blood pressure mildly elevated. We will follow this and adjust medicines as needed. Check potassium and renal function. His updated medication list for this problem includes:    Adult Aspirin Low Strength 81 Mg Tbdp (Aspirin) .Marland Kitchen... 1 once daily    Diltiazem Hcl Er Beads 240 Mg Xr24h-cap (Diltiazem hcl er beads) .Marland Kitchen... Take one capsule by mouth daily    Metoprolol Succinate 25 Mg Xr24h-tab (Metoprolol succinate) .Marland Kitchen... 2 tab by mouth once daily    Furosemide 40 Mg Tabs (Furosemide) .Marland Kitchen... Take1/2  tablet by mouth daily.  His updated medication list for this problem includes:    Adult Aspirin Low Strength 81 Mg Tbdp (Aspirin) .Marland Kitchen... 1 once daily    Diltiazem Hcl Er Beads 240 Mg Xr24h-cap (Diltiazem hcl er beads) .Marland Kitchen... Take one capsule by mouth daily    Metoprolol Succinate 25 Mg Xr24h-tab (Metoprolol succinate) .Marland Kitchen... 2 tab by mouth once daily    Furosemide 40 Mg Tabs (Furosemide) .Marland Kitchen... Take1/2  tablet by mouth daily.  Problem # 8:  COPD (ICD-496)  Other Orders: Carotid Duplex (Carotid Duplex) Abdominal Aorta Duplex (Abd Aorta Duplex)  Patient Instructions: 1)  Your physician recommends that you return for lab work EA:VWUJ DOPPLERS-LIPID/LIVER/BMP/CBC-272.0/V58.69/427.31/401.1 2)  Your physician wants you to follow-up in: 9 MONTHS  You will receive a reminder letter in the mail two months in advance. If you don't receive a letter, please call our office to schedule the follow-up appointment. 3)  Your physician has requested that you have an abdominal aorta duplex. During this test, an ultrasound is used to evaluate the aorta. Allow 30 minutes for this exam. Do not eat after midnight the day before and avoid carbonated beverages. There are no restrictions or special instructions. 4)  Your physician has requested that you have a carotid duplex. This test is an ultrasound of the carotid arteries in  your neck. It looks at blood flow through these arteries that supply the brain with blood. Allow one hour for this exam. There are no restrictions or special instructions. 5)  Your physician has requested that you have a renal artery duplex. During this test, an ultrasound is used to evaluate blood flow to the kidneys. Allow one hour for this exam. Do not eat after midnight the day before and avoid carbonated beverages. Take your medications as you usually do. 6)  SCHEDULE ALL DOPPLERS IN FEB

## 2010-04-25 ENCOUNTER — Encounter: Payer: Self-pay | Admitting: Internal Medicine

## 2010-04-25 ENCOUNTER — Other Ambulatory Visit (INDEPENDENT_AMBULATORY_CARE_PROVIDER_SITE_OTHER): Payer: MEDICARE

## 2010-04-25 ENCOUNTER — Encounter (INDEPENDENT_AMBULATORY_CARE_PROVIDER_SITE_OTHER): Payer: MEDICARE

## 2010-04-25 ENCOUNTER — Encounter: Payer: Self-pay | Admitting: Cardiology

## 2010-04-25 ENCOUNTER — Other Ambulatory Visit: Payer: Self-pay | Admitting: Internal Medicine

## 2010-04-25 DIAGNOSIS — I714 Abdominal aortic aneurysm, without rupture: Secondary | ICD-10-CM

## 2010-04-25 DIAGNOSIS — I4891 Unspecified atrial fibrillation: Secondary | ICD-10-CM

## 2010-04-25 DIAGNOSIS — E7800 Pure hypercholesterolemia, unspecified: Secondary | ICD-10-CM

## 2010-04-25 DIAGNOSIS — I6529 Occlusion and stenosis of unspecified carotid artery: Secondary | ICD-10-CM

## 2010-04-25 DIAGNOSIS — Z79899 Other long term (current) drug therapy: Secondary | ICD-10-CM

## 2010-04-25 DIAGNOSIS — Z7901 Long term (current) use of anticoagulants: Secondary | ICD-10-CM

## 2010-04-25 DIAGNOSIS — I701 Atherosclerosis of renal artery: Secondary | ICD-10-CM

## 2010-04-25 LAB — BASIC METABOLIC PANEL
BUN: 7 mg/dL (ref 6–23)
Chloride: 101 mEq/L (ref 96–112)
Glucose, Bld: 102 mg/dL — ABNORMAL HIGH (ref 70–99)
Potassium: 3.7 mEq/L (ref 3.5–5.1)

## 2010-04-25 LAB — CBC WITH DIFFERENTIAL/PLATELET
Eosinophils Relative: 1.7 % (ref 0.0–5.0)
HCT: 44.7 % (ref 39.0–52.0)
Hemoglobin: 15.2 g/dL (ref 13.0–17.0)
Lymphs Abs: 1.7 10*3/uL (ref 0.7–4.0)
MCV: 90.6 fl (ref 78.0–100.0)
Monocytes Absolute: 0.9 10*3/uL (ref 0.1–1.0)
Neutro Abs: 9.5 10*3/uL — ABNORMAL HIGH (ref 1.4–7.7)
Platelets: 240 10*3/uL (ref 150.0–400.0)
WBC: 12.3 10*3/uL — ABNORMAL HIGH (ref 4.5–10.5)

## 2010-04-25 LAB — HEPATIC FUNCTION PANEL
Alkaline Phosphatase: 156 U/L — ABNORMAL HIGH (ref 39–117)
Bilirubin, Direct: 0.2 mg/dL (ref 0.0–0.3)
Total Bilirubin: 0.7 mg/dL (ref 0.3–1.2)
Total Protein: 6.8 g/dL (ref 6.0–8.3)

## 2010-04-25 LAB — LIPID PANEL
Cholesterol: 123 mg/dL (ref 0–200)
LDL Cholesterol: 69 mg/dL (ref 0–99)
Total CHOL/HDL Ratio: 4

## 2010-04-27 ENCOUNTER — Inpatient Hospital Stay (HOSPITAL_COMMUNITY): Payer: MEDICARE

## 2010-04-27 ENCOUNTER — Emergency Department (HOSPITAL_COMMUNITY): Payer: MEDICARE

## 2010-04-27 ENCOUNTER — Encounter: Payer: Self-pay | Admitting: Internal Medicine

## 2010-04-27 ENCOUNTER — Inpatient Hospital Stay (HOSPITAL_COMMUNITY)
Admission: EM | Admit: 2010-04-27 | Discharge: 2010-05-12 | DRG: 308 | Disposition: A | Discharge status: Home Health | Payer: MEDICARE | Source: Ambulatory Visit | Attending: Family Medicine | Admitting: Family Medicine

## 2010-04-27 ENCOUNTER — Other Ambulatory Visit (HOSPITAL_COMMUNITY): Payer: MEDICARE

## 2010-04-27 ENCOUNTER — Telehealth: Payer: Self-pay | Admitting: Internal Medicine

## 2010-04-27 DIAGNOSIS — I10 Essential (primary) hypertension: Secondary | ICD-10-CM | POA: Diagnosis present

## 2010-04-27 DIAGNOSIS — S32509A Unspecified fracture of unspecified pubis, initial encounter for closed fracture: Secondary | ICD-10-CM | POA: Diagnosis present

## 2010-04-27 DIAGNOSIS — F329 Major depressive disorder, single episode, unspecified: Secondary | ICD-10-CM | POA: Diagnosis present

## 2010-04-27 DIAGNOSIS — Z7982 Long term (current) use of aspirin: Secondary | ICD-10-CM

## 2010-04-27 DIAGNOSIS — I4891 Unspecified atrial fibrillation: Secondary | ICD-10-CM | POA: Diagnosis present

## 2010-04-27 DIAGNOSIS — I472 Ventricular tachycardia, unspecified: Principal | ICD-10-CM | POA: Diagnosis present

## 2010-04-27 DIAGNOSIS — R7309 Other abnormal glucose: Secondary | ICD-10-CM | POA: Diagnosis present

## 2010-04-27 DIAGNOSIS — I4729 Other ventricular tachycardia: Principal | ICD-10-CM | POA: Diagnosis present

## 2010-04-27 DIAGNOSIS — J690 Pneumonitis due to inhalation of food and vomit: Secondary | ICD-10-CM | POA: Diagnosis present

## 2010-04-27 DIAGNOSIS — D72829 Elevated white blood cell count, unspecified: Secondary | ICD-10-CM | POA: Diagnosis present

## 2010-04-27 DIAGNOSIS — J9600 Acute respiratory failure, unspecified whether with hypoxia or hypercapnia: Secondary | ICD-10-CM | POA: Diagnosis present

## 2010-04-27 DIAGNOSIS — F3289 Other specified depressive episodes: Secondary | ICD-10-CM | POA: Diagnosis present

## 2010-04-27 DIAGNOSIS — Z8673 Personal history of transient ischemic attack (TIA), and cerebral infarction without residual deficits: Secondary | ICD-10-CM

## 2010-04-27 DIAGNOSIS — T380X5A Adverse effect of glucocorticoids and synthetic analogues, initial encounter: Secondary | ICD-10-CM | POA: Diagnosis present

## 2010-04-27 DIAGNOSIS — W19XXXA Unspecified fall, initial encounter: Secondary | ICD-10-CM | POA: Diagnosis present

## 2010-04-27 DIAGNOSIS — E876 Hypokalemia: Secondary | ICD-10-CM | POA: Diagnosis present

## 2010-04-27 LAB — CARDIAC PANEL(CRET KIN+CKTOT+MB+TROPI)
Relative Index: 2.3 (ref 0.0–2.5)
Total CK: 185 U/L (ref 7–232)
Troponin I: 0.03 ng/mL (ref 0.00–0.06)

## 2010-04-27 LAB — URINALYSIS, ROUTINE W REFLEX MICROSCOPIC
Ketones, ur: 15 mg/dL — AB
Nitrite: NEGATIVE
Protein, ur: NEGATIVE mg/dL
Urine Glucose, Fasting: NEGATIVE mg/dL
pH: 6 (ref 5.0–8.0)

## 2010-04-27 LAB — CBC
HCT: 41.5 % (ref 39.0–52.0)
Hemoglobin: 14.5 g/dL (ref 13.0–17.0)
MCH: 30.5 pg (ref 26.0–34.0)
MCHC: 34.9 g/dL (ref 30.0–36.0)
MCV: 87.4 fL (ref 78.0–100.0)
RDW: 14.2 % (ref 11.5–15.5)

## 2010-04-27 LAB — BASIC METABOLIC PANEL
CO2: 24 mEq/L (ref 19–32)
Calcium: 8.4 mg/dL (ref 8.4–10.5)
GFR calc Af Amer: >60 mL/min (ref >60)
GFR calc non Af Amer: >60 mL/min (ref >60)
Glucose, Bld: 164 mg/dL — ABNORMAL HIGH (ref 70–99)
Potassium: 3.4 mEq/L — ABNORMAL LOW (ref 3.5–5.1)
Sodium: 135 mEq/L (ref 135–145)

## 2010-04-27 LAB — DIFFERENTIAL
Eosinophils Relative: 0 % (ref 0–5)
Lymphocytes Relative: 9 % — ABNORMAL LOW (ref 12–46)
Lymphs Abs: 1.2 10*3/uL (ref 0.7–4.0)
Monocytes Absolute: 1.4 10*3/uL — ABNORMAL HIGH (ref 0.1–1.0)
Monocytes Relative: 11 % (ref 3–12)
Neutro Abs: 10.1 10*3/uL — ABNORMAL HIGH (ref 1.7–7.7)

## 2010-04-27 LAB — PROTIME-INR: INR: 2.19 — ABNORMAL HIGH (ref 0.00–1.49)

## 2010-04-27 LAB — MAGNESIUM: Magnesium: 2 mg/dL (ref 1.5–2.5)

## 2010-04-27 LAB — CK TOTAL AND CKMB (NOT AT ARMC): Relative Index: INVALID (ref 0.0–2.5)

## 2010-04-28 ENCOUNTER — Inpatient Hospital Stay (HOSPITAL_COMMUNITY): Payer: MEDICARE

## 2010-04-28 LAB — CBC
HCT: 39 % (ref 39.0–52.0)
MCH: 29.6 pg (ref 26.0–34.0)
MCHC: 34.1 g/dL (ref 30.0–36.0)
MCV: 86.9 fL (ref 78.0–100.0)
Platelets: 186 10*3/uL (ref 150–400)
RDW: 14.3 % (ref 11.5–15.5)
WBC: 11.1 10*3/uL — ABNORMAL HIGH (ref 4.0–10.5)

## 2010-04-28 LAB — COMPREHENSIVE METABOLIC PANEL
Albumin: 3.1 g/dL — ABNORMAL LOW (ref 3.5–5.2)
Alkaline Phosphatase: 111 U/L (ref 39–117)
BUN: 5 mg/dL — ABNORMAL LOW (ref 6–23)
Calcium: 8.4 mg/dL (ref 8.4–10.5)
Creatinine, Ser: 0.58 mg/dL (ref 0.4–1.5)
Glucose, Bld: 136 mg/dL — ABNORMAL HIGH (ref 70–99)
Total Protein: 6.2 g/dL (ref 6.0–8.3)

## 2010-04-28 LAB — URINE CULTURE
Colony Count: NO GROWTH
Culture  Setup Time: 201202100938
Culture: NO GROWTH

## 2010-04-28 LAB — CARDIAC PANEL(CRET KIN+CKTOT+MB+TROPI)
Total CK: 223 U/L (ref 7–232)
Troponin I: 0.03 ng/mL (ref 0.00–0.06)

## 2010-04-28 LAB — CLOSTRIDIUM DIFFICILE BY PCR: Toxigenic C. Difficile by PCR: NEGATIVE

## 2010-04-29 ENCOUNTER — Encounter: Payer: Self-pay | Admitting: Cardiology

## 2010-04-29 ENCOUNTER — Inpatient Hospital Stay (HOSPITAL_COMMUNITY): Payer: MEDICARE

## 2010-04-29 DIAGNOSIS — I4891 Unspecified atrial fibrillation: Secondary | ICD-10-CM

## 2010-04-29 LAB — BRAIN NATRIURETIC PEPTIDE: Pro B Natriuretic peptide (BNP): 170 pg/mL — ABNORMAL HIGH (ref 0.0–100.0)

## 2010-04-29 LAB — BASIC METABOLIC PANEL
BUN: 5 mg/dL — ABNORMAL LOW (ref 6–23)
BUN: 5 mg/dL — ABNORMAL LOW (ref 6–23)
BUN: 7 mg/dL (ref 6–23)
CO2: 27 mEq/L (ref 19–32)
Chloride: 104 mEq/L (ref 96–112)
Chloride: 101 mEq/L (ref 96–112)
Creatinine, Ser: 0.59 mg/dL (ref 0.4–1.5)
Creatinine, Ser: 0.63 mg/dL (ref 0.4–1.5)
Creatinine, Ser: 0.75 mg/dL (ref 0.4–1.5)
GFR calc non Af Amer: >60 mL/min (ref >60)
Glucose, Bld: 132 mg/dL — ABNORMAL HIGH (ref 70–99)
Potassium: 3.1 mEq/L — ABNORMAL LOW (ref 3.5–5.1)

## 2010-04-29 LAB — MAGNESIUM: Magnesium: 2 mg/dL (ref 1.5–2.5)

## 2010-04-29 LAB — PROTIME-INR: Prothrombin Time: 28.5 seconds — ABNORMAL HIGH (ref 11.6–15.2)

## 2010-04-29 NOTE — Consult Note (Signed)
[name], BRINKLEY NO.:  [id number]  MEDICAL RECORD NO.:  [id number]           PATIENT TYPE:  I  LOCATION:  3731                         FACILITY:  [facility]  PHYSICIAN:  Jonelle Sidle, MD DATE OF BIRTH:  11/04/1923  DATE OF CONSULTATION:  04/29/2010 DATE OF DISCHARGE:                                CONSULTATION   REQUESTING SERVICE:  [facility].  PRIMARY CARDIOLOGIST:  Madolyn Frieze. Jens Som, MD, [facility]  REASON FOR CONSULTATION:  Rapid atrial fibrillation.  HISTORY OF PRESENT ILLNESS:  Mr. [name] is an 77 year old male with a history of hypertension, atrial flutter status post radiofrequency ablation, permanent atrial fibrillation being managed with a strategy of heart rate control and anticoagulation, and prior documentation of nonobstructive coronary artery disease with normal left ventricular systolic function.  Additional problems are outlined below.  He was seen by Dr. Jens Som in the office for routine visit in early January, and medication adjustments were made at that time to achieve better heart rate control.  Presently, the patient is admitted to the hospital following a fall without syncope.  This occurred when he was changing his cane from one hand to the other.  He has sustained a nondisplaced superior and inferior pubic ramus fracture on the right and is beginning to undergo weightbearing as tolerated with Physical Therapy.  He did not require any surgical intervention.  During hospital stay, his heart rate has fluctuated, increasing significantly in atrial fibrillation up to 150 beats per minute when he is up and ambulating.  It appears that he was placed on his baseline oral medications including Cardizem CD and Toprol-XL, however, he did require intravenous diltiazem temporarily. At the present time, his heart rate is approximately 100 beats per minute in atrial fibrillation at rest, and he is comfortable without chest pain.  We  have been asked to assess him further. Of note, he has evidence of an apparent ileus or early small bowel obstruction by plain films.  ALLERGIES:  No known drug allergies.  PRESENT MEDICATIONS:  Include: 1. Aspirin 81 mg p.o. daily. 2. Diltiazem CD 240 mg p.o. daily. 3. Toprol-XL recently increased from 25 mg daily to 50 mg daily. 4. Ongoing potassium supplementation. 5. Zoloft 25 mg p.o. daily. 6. Coumadin as directed by pharmacy.  OUTPATIENT MEDICATIONS:  As of his last office visit with Dr. Jens Som included similar doses of Toprol-XL and diltiazem CD, also 20 mg of Lasix daily.  Pravachol 40 mg daily also listed.  Past medical history is discussed above.  Additional problems include abdominal aortic aneurysm measured at 3.8 x 3.9 cm in February 2011, mild carotid artery disease measuring 0 to 39% stenosis bilaterally as of February 2011, with recent followup results pending.  Additional history of bilateral renal artery stenosis, 90% on left, and 75% on the right, status post stent placement on the left.  Previous history of stroke, hyperlipidemia, appendectomy, cholecystectomy, prostate cancer.  SOCIAL HISTORY:  The patient lives with his daughter, denies any tobacco or alcohol use.  Uses a cane to ambulate with some functional limitations and history of falls.  FAMILY HISTORY:  Reviewed, significant for cardiovascular disease.  REVIEW OF SYSTEMS:  As outlined above.  The patient denies any syncope. No recent chest pain or progressive palpitations.  No orthopnea or PND. Daughter states that he has had a reasonable appetite, although "snacks mostly."  No sudden dizziness.  No reported bleeding problems on Coumadin beyond bruising.  Otherwise reviewed and negative.  PHYSICAL EXAMINATION:  VITAL SIGNS:  Temperature is 97.5 degrees, heart rate around 100 beats per minute in atrial fibrillation by telemetry, respirations 18, blood pressure 133/68, oxygen saturation is 95% on  2 liters nasal cannula. GENERAL:  This is a chronically ill-appearing elderly male in no acute distress. HEENT:  Conjunctivae and lids are normal.  Oropharynx with moist mucosa. NECK:  Supple.  No obvious elevated JVP.  No carotid bruits.  No thyromegaly. LUNGS:  Exhibit diminished breath sounds but clear without labored breathing. CARDIAC:  Regularly irregular rhythm, distant overall, no obvious S3, although PMI is indistinct. ABDOMEN:  Soft, nontender.  Bowel sounds present but diminished.  No guarding or rebound. EXTREMITIES:  Exhibit no significant edema.  Distal pulses 1+. SKIN:  Warm and dry with a few scattered ecchymoses that appear old. MUSCULOSKELETAL:  No kyphosis is noted. NEUROPSYCHIATRIC:  The patient is alert and oriented x3.  Affect is grossly appropriate.  LABORATORY DATA:  WBC is 11.1, hemoglobin 13.3, hematocrit 39.0, platelets 186.  INR is 2.6.  Sodium 139, potassium 3.1, chloride 105, bicarb 26, glucose 132, BUN 5, creatinine 0.5, magnesium 1.9, CK 78 up to 223 with normal troponin-I of 0.01 up to 0.03, LDL 69, TSH 0.80. Urinalysis with 15 ketones, otherwise negative.  Clostridium difficile PCR negative.  Urine culture without growth.  Abdominal film from April 29, 2010 shows evidence of mild dilatation of the small bowel loops with gaseous distention of the large bowel loops, little change compared to his most recent film with evidence of ileus or perhaps early partial small-bowel obstruction.  Ultrasound of the aorta from April 27, 2010, demonstrated abdominal aortic aneurysm measuring 4.0 x 4.4 cm extending over a length of 5.5 cm with mild aneurysmal dilatation of the common iliac arteries.  Electrocardiogram from April 29, 2010, shows atrial fibrillation at 90-95 beats per minute with leftward axis and poor anterior R-wave progression, question old infarct pattern.  Otherwise nonspecific ST-T changes noted with a single PVC.  Review of  telemetry confirms atrial fibrillation, also brief episodes of wide complex tachycardia, rule out nonsustained ventricular tachycardia. Could also be aberrantly conducted atrial fibrillation.  IMPRESSION: 1. Permanent atrial fibrillation, recently with documented rapid     rates, worse with ambulation, in the setting of a recent pelvic     fracture, pain, hypokalemia, and possible ileus or partial small-     bowel obstruction with question of incomplete absorption of oral     medications. 2. Brief wide complex tachycardia documented, asymptomatic.  Rule out     nonsustained ventricular tachycardia.  Last assessment of LVEF was     normal in 2010.  Recent cardiac markers argue against an acute     coronary syndrome. 3. Prior history of nonobstructive coronary artery disease based on     cardiac catheterization from 2006.  Baseline ECG is abnormal with     leftward axis and poor anterior R-wave progression, rule out old     infarct pattern. 4. Previous history of stroke, the patient on Coumadin with concurrent     atrial fibrillation.  Therapeutic INR. 5. Prior history of atrial flutter, status  post radiofrequency     ablation, quiescent. 6. History of hypertension, blood pressure stable overall.  RECOMMENDATIONS:  I spoke with the patient and his daughter.  In light of the patient's apparent ileus versus early partial small-bowel obstruction, we suggest reinitiating intravenous diltiazem in case he is having problems absorbing the oral preparation.  Otherwise continue Toprol-XL, changing dose to 25 mg p.o. b.i.d. Followup BMET, continue to  replete potassium, and follow up magnesium as well.  A 2-D echocardiogram  will also be obtained, reassess left systolic function.  Our service will  plan to follow with you.     Jonelle Sidle, MD     SGM/MEDQ  D:  04/29/2010  T:  04/29/2010  Job:  [id number]  cc:   Madolyn Frieze. Jens Som, MD, [facility]  Electronically  Signed by Nona Dell MD on 04/29/2010 01:39:25 PM

## 2010-04-30 ENCOUNTER — Inpatient Hospital Stay (HOSPITAL_COMMUNITY): Payer: MEDICARE

## 2010-04-30 ENCOUNTER — Telehealth: Payer: Self-pay | Admitting: Internal Medicine

## 2010-04-30 LAB — BASIC METABOLIC PANEL
BUN: 6 mg/dL (ref 6–23)
BUN: 7 mg/dL (ref 6–23)
CO2: 25 mEq/L (ref 19–32)
CO2: 25 mEq/L (ref 19–32)
Chloride: 101 mEq/L (ref 96–112)
Chloride: 105 mEq/L (ref 96–112)
Creatinine, Ser: 0.63 mg/dL (ref 0.4–1.5)
Glucose, Bld: 148 mg/dL — ABNORMAL HIGH (ref 70–99)
Potassium: 3.9 mEq/L (ref 3.5–5.1)

## 2010-04-30 LAB — BLOOD GAS, ARTERIAL
Acid-Base Excess: 1.6 mmol/L (ref 0.0–2.0)
Bicarbonate: 24.1 mEq/L — ABNORMAL HIGH (ref 20.0–24.0)
Drawn by: 29165
FIO2: 0.35 %
Patient temperature: 98.2
TCO2: 25.1 mmol/L (ref 0–100)
pCO2 arterial: 34.7 mmHg — ABNORMAL LOW (ref 35.0–45.0)
pCO2 arterial: 36.1 mmHg (ref 35.0–45.0)
pH, Arterial: 7.456 — ABNORMAL HIGH (ref 7.350–7.450)
pH, Arterial: 7.457 — ABNORMAL HIGH (ref 7.350–7.450)

## 2010-04-30 LAB — BRAIN NATRIURETIC PEPTIDE: Pro B Natriuretic peptide (BNP): 103 pg/mL — ABNORMAL HIGH (ref 0.0–100.0)

## 2010-04-30 NOTE — Telephone Encounter (Signed)
Call back number please??

## 2010-04-30 NOTE — Telephone Encounter (Signed)
Pts daughter called and said that her dad has been in hospital for 5 day with a broken pelvis. Hospital Dr is trying to dx pt with emphysema and copd. Pts daughter is not aware of her dad having either of these illnesses. Pts daughter is req that Dr Amador Cunas call asap. Pt req Dr Amador Cunas come to hosptital to see pt.  [place] room [phone], if possible.

## 2010-04-30 NOTE — Telephone Encounter (Signed)
Called and discussed

## 2010-04-30 NOTE — Telephone Encounter (Signed)
Pt is at [facility] room # [phone]. The call back number would be [phone].

## 2010-05-01 ENCOUNTER — Inpatient Hospital Stay (HOSPITAL_COMMUNITY): Payer: MEDICARE

## 2010-05-01 DIAGNOSIS — F05 Delirium due to known physiological condition: Secondary | ICD-10-CM

## 2010-05-01 DIAGNOSIS — J9600 Acute respiratory failure, unspecified whether with hypoxia or hypercapnia: Secondary | ICD-10-CM

## 2010-05-01 DIAGNOSIS — I4891 Unspecified atrial fibrillation: Secondary | ICD-10-CM

## 2010-05-01 DIAGNOSIS — J690 Pneumonitis due to inhalation of food and vomit: Secondary | ICD-10-CM

## 2010-05-01 LAB — PROTIME-INR
INR: 3.29 — ABNORMAL HIGH (ref 0.00–1.49)
Prothrombin Time: 33.5 seconds — ABNORMAL HIGH (ref 11.6–15.2)

## 2010-05-01 LAB — BLOOD GAS, ARTERIAL
Acid-Base Excess: 1.1 mmol/L (ref 0.0–2.0)
Bicarbonate: 25.7 mEq/L — ABNORMAL HIGH (ref 20.0–24.0)
Drawn by: 29925
FIO2: 0.35 %
O2 Saturation: 92.1 %
TCO2: 27.1 mmol/L (ref 0–100)
pCO2 arterial: 45 mmHg (ref 35.0–45.0)
pO2, Arterial: 68 mmHg — ABNORMAL LOW (ref 80.0–100.0)

## 2010-05-01 LAB — CARDIAC PANEL(CRET KIN+CKTOT+MB+TROPI)
CK, MB: 3.7 ng/mL (ref 0.3–4.0)
CK, MB: 4.1 ng/mL — ABNORMAL HIGH (ref 0.3–4.0)
Relative Index: 3.6 — ABNORMAL HIGH (ref 0.0–2.5)
Total CK: 104 U/L (ref 7–232)
Total CK: 91 U/L (ref 7–232)
Troponin I: 0.03 ng/mL (ref 0.00–0.06)
Troponin I: 0.01 ng/mL (ref 0.00–0.06)

## 2010-05-01 LAB — CBC
MCH: 30.1 pg (ref 26.0–34.0)
Platelets: 236 10*3/uL (ref 150–400)
RBC: 4.69 MIL/uL (ref 4.22–5.81)
RDW: 14.5 % (ref 11.5–15.5)
WBC: 17 10*3/uL — ABNORMAL HIGH (ref 4.0–10.5)

## 2010-05-01 LAB — COMPREHENSIVE METABOLIC PANEL
AST: 44 U/L — ABNORMAL HIGH (ref 0–37)
Albumin: 3.3 g/dL — ABNORMAL LOW (ref 3.5–5.2)
BUN: 11 mg/dL (ref 6–23)
Chloride: 98 mEq/L (ref 96–112)
Creatinine, Ser: 0.78 mg/dL (ref 0.4–1.5)
GFR calc Af Amer: >60 mL/min (ref >60)
Potassium: 4.1 mEq/L (ref 3.5–5.1)
Total Bilirubin: 1.2 mg/dL (ref 0.3–1.2)
Total Protein: 6.7 g/dL (ref 6.0–8.3)

## 2010-05-01 LAB — POCT I-STAT 3, ART BLOOD GAS (G3+)
Bicarbonate: 29.9 mEq/L — ABNORMAL HIGH (ref 20.0–24.0)
O2 Saturation: 100 %
Patient temperature: 98.6
TCO2: 32 mmol/L (ref 0–100)
pH, Arterial: 7.357 (ref 7.350–7.450)

## 2010-05-01 LAB — DIFFERENTIAL
Basophils Relative: 0 % (ref 0–1)
Eosinophils Absolute: 0 10*3/uL (ref 0.0–0.7)
Eosinophils Relative: 0 % (ref 0–5)
Neutrophils Relative %: 89 % — ABNORMAL HIGH (ref 43–77)

## 2010-05-01 LAB — BASIC METABOLIC PANEL
Calcium: 8.9 mg/dL (ref 8.4–10.5)
GFR calc Af Amer: >60 mL/min (ref >60)
GFR calc non Af Amer: >60 mL/min (ref >60)
Glucose, Bld: 176 mg/dL — ABNORMAL HIGH (ref 70–99)
Potassium: 4.2 mEq/L (ref 3.5–5.1)
Sodium: 139 mEq/L (ref 135–145)

## 2010-05-01 LAB — PHOSPHORUS: Phosphorus: 2.8 mg/dL (ref 2.3–4.6)

## 2010-05-01 LAB — BRAIN NATRIURETIC PEPTIDE: Pro B Natriuretic peptide (BNP): 86 pg/mL (ref 0.0–100.0)

## 2010-05-01 LAB — LACTIC ACID, PLASMA: Lactic Acid, Venous: 1.8 mmol/L (ref 0.5–2.2)

## 2010-05-02 ENCOUNTER — Inpatient Hospital Stay (HOSPITAL_COMMUNITY): Payer: MEDICARE

## 2010-05-02 DIAGNOSIS — I059 Rheumatic mitral valve disease, unspecified: Secondary | ICD-10-CM

## 2010-05-02 LAB — CBC
HCT: 39.8 % (ref 39.0–52.0)
MCH: 29.2 pg (ref 26.0–34.0)
MCV: 88.8 fL (ref 78.0–100.0)
RBC: 4.48 MIL/uL (ref 4.22–5.81)
RDW: 14.7 % (ref 11.5–15.5)
WBC: 13.7 10*3/uL — ABNORMAL HIGH (ref 4.0–10.5)

## 2010-05-02 LAB — BASIC METABOLIC PANEL
BUN: 23 mg/dL (ref 6–23)
Chloride: 103 mEq/L (ref 96–112)
Creatinine, Ser: 0.69 mg/dL (ref 0.4–1.5)
Glucose, Bld: 193 mg/dL — ABNORMAL HIGH (ref 70–99)
Potassium: 3.8 mEq/L (ref 3.5–5.1)

## 2010-05-02 LAB — GLUCOSE, CAPILLARY
Glucose-Capillary: 189 mg/dL — ABNORMAL HIGH (ref 70–99)
Glucose-Capillary: 166 mg/dL — ABNORMAL HIGH (ref 70–99)

## 2010-05-02 LAB — VANCOMYCIN, TROUGH: Vancomycin Tr: 19 ug/mL (ref 10.0–20.0)

## 2010-05-03 ENCOUNTER — Inpatient Hospital Stay (HOSPITAL_COMMUNITY): Payer: MEDICARE

## 2010-05-03 LAB — CULTURE, RESPIRATORY W GRAM STAIN
Culture: NORMAL
Special Requests: NORMAL

## 2010-05-03 LAB — BASIC METABOLIC PANEL
CO2: 29 mEq/L (ref 19–32)
Calcium: 8.3 mg/dL — ABNORMAL LOW (ref 8.4–10.5)
GFR calc Af Amer: >60 mL/min (ref >60)
GFR calc non Af Amer: >60 mL/min (ref >60)
Potassium: 3.7 mEq/L (ref 3.5–5.1)
Sodium: 143 mEq/L (ref 135–145)

## 2010-05-03 LAB — DIFFERENTIAL
Basophils Absolute: 0 10*3/uL (ref 0.0–0.1)
Eosinophils Absolute: 0 10*3/uL (ref 0.0–0.7)
Eosinophils Relative: 0 % (ref 0–5)
Lymphs Abs: 0.6 10*3/uL — ABNORMAL LOW (ref 0.7–4.0)

## 2010-05-03 LAB — POCT I-STAT 3, ART BLOOD GAS (G3+)
O2 Saturation: 96 %
Patient temperature: 98.7
TCO2: 29 mmol/L (ref 0–100)

## 2010-05-03 LAB — CBC
MCH: 29.6 pg (ref 26.0–34.0)
MCHC: 33.1 g/dL (ref 30.0–36.0)
MCV: 89.4 fL (ref 78.0–100.0)
WBC: 14.7 10*3/uL — ABNORMAL HIGH (ref 4.0–10.5)

## 2010-05-03 LAB — GLUCOSE, CAPILLARY
Glucose-Capillary: 177 mg/dL — ABNORMAL HIGH (ref 70–99)
Glucose-Capillary: 192 mg/dL — ABNORMAL HIGH (ref 70–99)
Glucose-Capillary: 206 mg/dL — ABNORMAL HIGH (ref 70–99)

## 2010-05-03 LAB — PROTIME-INR
INR: 2.72 — ABNORMAL HIGH (ref 0.00–1.49)
Prothrombin Time: 28.9 seconds — ABNORMAL HIGH (ref 11.6–15.2)

## 2010-05-03 LAB — PHOSPHORUS: Phosphorus: 3.1 mg/dL (ref 2.3–4.6)

## 2010-05-03 NOTE — Medication Information (Signed)
Summary: Coumadin Clinic  Anticoagulant Therapy  Managed by: Georgina Pillion, PharmD Supervising MD: Myrtis Ser MD,Jeffrey Indication 1: Atrial Fibrillation (ICD-427.31) Lab Used: [facility] [place] Site: [address] INR POC 4 INR RANGE 2-3  Dietary changes: yes       Details: Eating less greens and less overall  Health status changes: no    Bleeding/hemorrhagic complications: no    Recent/future hospitalizations: no    Any changes in medication regimen? yes       Details: Added a medication for depression  Recent/future dental: no  Any missed doses?: no       Is patient compliant with meds? yes       Allergies: No Known Drug Allergies  Anticoagulation Management History:      Positive risk factors for bleeding include an age of 16 years or older.  The bleeding index is 'intermediate risk'.  Positive CHADS2 values include History of HTN and Age > 44 years old.  The start date was 04/07/2008.  His last INR was 2.3.  Anticoagulation responsible provider: Myrtis Ser MD,Jeffrey.  INR POC: 4.  Cuvette Lot#: [id number].  Exp: 03/2011.    Anticoagulation Management Assessment/Plan:      The patient's current anticoagulation dose is Coumadin 5 mg tabs: Take as directed by coumadin clinic..  The target INR is 2.0-3.0.  The next INR is due 05/09/2010.  Anticoagulation instructions were given to daughter.  Results were reviewed/authorized by Georgina Pillion, PharmD.  He was notified by Georgina Pillion PharmD.         Prior Anticoagulation Instructions: INR 2.3 (goal is 2-3)  Continue current regimen of 1/2 tablet daily except take 1 tablet on Mondays.   Current Anticoagulation Instructions: Hold today's dose and then continue to with your current regimen of 1/2 tablet (2.5 mg) daily except for 1 tablet (5 mg) on Mondays.  INR 4

## 2010-05-04 ENCOUNTER — Inpatient Hospital Stay (HOSPITAL_COMMUNITY): Payer: MEDICARE

## 2010-05-04 LAB — GLUCOSE, CAPILLARY
Glucose-Capillary: 109 mg/dL — ABNORMAL HIGH (ref 70–99)
Glucose-Capillary: 142 mg/dL — ABNORMAL HIGH (ref 70–99)

## 2010-05-04 LAB — BASIC METABOLIC PANEL
BUN: 17 mg/dL (ref 6–23)
CO2: 31 mEq/L (ref 19–32)
Chloride: 107 mEq/L (ref 96–112)
Glucose, Bld: 85 mg/dL (ref 70–99)
Potassium: 3.5 mEq/L (ref 3.5–5.1)

## 2010-05-04 LAB — PROTIME-INR
INR: 1.32 (ref 0.00–1.49)
Prothrombin Time: 16.6 seconds — ABNORMAL HIGH (ref 11.6–15.2)

## 2010-05-04 LAB — APTT: aPTT: 23 seconds — ABNORMAL LOW (ref 24–37)

## 2010-05-04 LAB — CBC
HCT: 40.5 % (ref 39.0–52.0)
Hemoglobin: 13.3 g/dL (ref 13.0–17.0)
MCV: 88 fL (ref 78.0–100.0)
RBC: 4.6 MIL/uL (ref 4.22–5.81)
WBC: 13.8 10*3/uL — ABNORMAL HIGH (ref 4.0–10.5)

## 2010-05-04 LAB — MAGNESIUM: Magnesium: 2.5 mg/dL (ref 1.5–2.5)

## 2010-05-05 LAB — BASIC METABOLIC PANEL
Chloride: 103 mEq/L (ref 96–112)
GFR calc Af Amer: >60 mL/min (ref >60)
GFR calc non Af Amer: >60 mL/min (ref >60)
Potassium: 4.1 mEq/L (ref 3.5–5.1)

## 2010-05-05 LAB — MAGNESIUM: Magnesium: 2.5 mg/dL (ref 1.5–2.5)

## 2010-05-05 LAB — PHOSPHORUS: Phosphorus: 2.5 mg/dL (ref 2.3–4.6)

## 2010-05-05 LAB — GLUCOSE, CAPILLARY
Glucose-Capillary: 162 mg/dL — ABNORMAL HIGH (ref 70–99)
Glucose-Capillary: 130 mg/dL — ABNORMAL HIGH (ref 70–99)

## 2010-05-05 LAB — PROTIME-INR
INR: 1.43 (ref 0.00–1.49)
Prothrombin Time: 17.6 seconds — ABNORMAL HIGH (ref 11.6–15.2)

## 2010-05-05 LAB — CBC
HCT: 42.4 % (ref 39.0–52.0)
Hemoglobin: 14 g/dL (ref 13.0–17.0)
RBC: 4.82 MIL/uL (ref 4.22–5.81)
WBC: 10.9 10*3/uL — ABNORMAL HIGH (ref 4.0–10.5)

## 2010-05-06 ENCOUNTER — Inpatient Hospital Stay (HOSPITAL_COMMUNITY): Payer: MEDICARE

## 2010-05-06 LAB — CBC
Hemoglobin: 14.1 g/dL (ref 13.0–17.0)
MCH: 29.7 pg (ref 26.0–34.0)
MCHC: 33.5 g/dL (ref 30.0–36.0)
Platelets: 253 10*3/uL (ref 150–400)

## 2010-05-06 LAB — BASIC METABOLIC PANEL
CO2: 28 mEq/L (ref 19–32)
Calcium: 8.2 mg/dL — ABNORMAL LOW (ref 8.4–10.5)
Creatinine, Ser: 0.71 mg/dL (ref 0.4–1.5)
GFR calc Af Amer: >60 mL/min (ref >60)
GFR calc non Af Amer: >60 mL/min (ref >60)
Glucose, Bld: 124 mg/dL — ABNORMAL HIGH (ref 70–99)
Sodium: 137 mEq/L (ref 135–145)

## 2010-05-06 LAB — PROTIME-INR
INR: 1.76 — ABNORMAL HIGH (ref 0.00–1.49)
Prothrombin Time: 20.7 seconds — ABNORMAL HIGH (ref 11.6–15.2)

## 2010-05-06 LAB — GLUCOSE, CAPILLARY
Glucose-Capillary: 137 mg/dL — ABNORMAL HIGH (ref 70–99)
Glucose-Capillary: 142 mg/dL — ABNORMAL HIGH (ref 70–99)

## 2010-05-07 LAB — CBC
HCT: 40.8 % (ref 39.0–52.0)
MCH: 28.9 pg (ref 26.0–34.0)
MCV: 87.9 fL (ref 78.0–100.0)
Platelets: 258 10*3/uL (ref 150–400)
RDW: 14.3 % (ref 11.5–15.5)
WBC: 14.8 10*3/uL — ABNORMAL HIGH (ref 4.0–10.5)

## 2010-05-07 LAB — BASIC METABOLIC PANEL
BUN: 14 mg/dL (ref 6–23)
Chloride: 103 mEq/L (ref 96–112)
Creatinine, Ser: 0.72 mg/dL (ref 0.4–1.5)
GFR calc non Af Amer: >60 mL/min (ref >60)
Glucose, Bld: 115 mg/dL — ABNORMAL HIGH (ref 70–99)
Potassium: 3.5 mEq/L (ref 3.5–5.1)

## 2010-05-07 LAB — GLUCOSE, CAPILLARY
Glucose-Capillary: 108 mg/dL — ABNORMAL HIGH (ref 70–99)
Glucose-Capillary: 139 mg/dL — ABNORMAL HIGH (ref 70–99)

## 2010-05-08 DIAGNOSIS — I4891 Unspecified atrial fibrillation: Secondary | ICD-10-CM

## 2010-05-08 LAB — BASIC METABOLIC PANEL
CO2: 27 mEq/L (ref 19–32)
Chloride: 106 mEq/L (ref 96–112)
GFR calc Af Amer: >60 mL/min (ref >60)
Glucose, Bld: 131 mg/dL — ABNORMAL HIGH (ref 70–99)
Potassium: 4.3 mEq/L (ref 3.5–5.1)
Sodium: 140 mEq/L (ref 135–145)

## 2010-05-08 LAB — DIFFERENTIAL
Lymphocytes Relative: 10 % — ABNORMAL LOW (ref 12–46)
Lymphs Abs: 1.5 10*3/uL (ref 0.7–4.0)
Monocytes Relative: 8 % (ref 3–12)
Neutrophils Relative %: 80 % — ABNORMAL HIGH (ref 43–77)

## 2010-05-08 LAB — CBC
HCT: 40.3 % (ref 39.0–52.0)
MCH: 29.6 pg (ref 26.0–34.0)
MCV: 89.8 fL (ref 78.0–100.0)
RBC: 4.49 MIL/uL (ref 4.22–5.81)
WBC: 15.2 10*3/uL — ABNORMAL HIGH (ref 4.0–10.5)

## 2010-05-08 LAB — GLUCOSE, CAPILLARY
Glucose-Capillary: 117 mg/dL — ABNORMAL HIGH (ref 70–99)
Glucose-Capillary: 148 mg/dL — ABNORMAL HIGH (ref 70–99)

## 2010-05-08 LAB — MAGNESIUM: Magnesium: 2.3 mg/dL (ref 1.5–2.5)

## 2010-05-09 LAB — BASIC METABOLIC PANEL
BUN: 12 mg/dL (ref 6–23)
CO2: 27 mEq/L (ref 19–32)
Chloride: 103 mEq/L (ref 96–112)
Creatinine, Ser: 0.7 mg/dL (ref 0.4–1.5)
GFR calc Af Amer: >60 mL/min (ref >60)
Glucose, Bld: 115 mg/dL — ABNORMAL HIGH (ref 70–99)

## 2010-05-09 LAB — GLUCOSE, CAPILLARY
Glucose-Capillary: 130 mg/dL — ABNORMAL HIGH (ref 70–99)
Glucose-Capillary: 159 mg/dL — ABNORMAL HIGH (ref 70–99)

## 2010-05-09 LAB — CBC
MCH: 29.5 pg (ref 26.0–34.0)
MCHC: 33.2 g/dL (ref 30.0–36.0)
MCV: 88.6 fL (ref 78.0–100.0)
Platelets: 241 10*3/uL (ref 150–400)
RBC: 4.48 MIL/uL (ref 4.22–5.81)

## 2010-05-10 ENCOUNTER — Inpatient Hospital Stay (HOSPITAL_COMMUNITY): Payer: MEDICARE

## 2010-05-10 LAB — PROTIME-INR
INR: 2.32 — ABNORMAL HIGH (ref 0.00–1.49)
Prothrombin Time: 25.6 seconds — ABNORMAL HIGH (ref 11.6–15.2)

## 2010-05-10 LAB — COMPREHENSIVE METABOLIC PANEL
ALT: 47 U/L (ref 0–53)
AST: 36 U/L (ref 0–37)
Alkaline Phosphatase: 138 U/L — ABNORMAL HIGH (ref 39–117)
BUN: 11 mg/dL (ref 6–23)
CO2: 27 mEq/L (ref 19–32)
Creatinine, Ser: 0.73 mg/dL (ref 0.4–1.5)
GFR calc Af Amer: >60 mL/min (ref >60)
GFR calc non Af Amer: >60 mL/min (ref >60)
Glucose, Bld: 113 mg/dL — ABNORMAL HIGH (ref 70–99)
Potassium: 3.7 mEq/L (ref 3.5–5.1)
Sodium: 139 mEq/L (ref 135–145)
Total Protein: 5.5 g/dL — ABNORMAL LOW (ref 6.0–8.3)

## 2010-05-10 LAB — CBC
HCT: 41.7 % (ref 39.0–52.0)
MCH: 29.5 pg (ref 26.0–34.0)
MCHC: 32.9 g/dL (ref 30.0–36.0)
MCV: 89.7 fL (ref 78.0–100.0)
Platelets: 256 10*3/uL (ref 150–400)
RDW: 15 % (ref 11.5–15.5)

## 2010-05-10 LAB — PHOSPHORUS: Phosphorus: 3.8 mg/dL (ref 2.3–4.6)

## 2010-05-10 LAB — MAGNESIUM: Magnesium: 2.2 mg/dL (ref 1.5–2.5)

## 2010-05-11 ENCOUNTER — Inpatient Hospital Stay (HOSPITAL_COMMUNITY): Payer: MEDICARE

## 2010-05-11 LAB — COMPREHENSIVE METABOLIC PANEL
ALT: 46 U/L (ref 0–53)
AST: 36 U/L (ref 0–37)
Albumin: 2.7 g/dL — ABNORMAL LOW (ref 3.5–5.2)
Alkaline Phosphatase: 151 U/L — ABNORMAL HIGH (ref 39–117)
CO2: 28 mEq/L (ref 19–32)
Chloride: 103 mEq/L (ref 96–112)
Creatinine, Ser: 0.77 mg/dL (ref 0.4–1.5)
GFR calc Af Amer: >60 mL/min (ref >60)
Potassium: 4.5 mEq/L (ref 3.5–5.1)
Sodium: 140 mEq/L (ref 135–145)
Total Bilirubin: 0.9 mg/dL (ref 0.3–1.2)

## 2010-05-11 LAB — PROTIME-INR: INR: 2.58 — ABNORMAL HIGH (ref 0.00–1.49)

## 2010-05-11 LAB — CBC
Hemoglobin: 12.8 g/dL — ABNORMAL LOW (ref 13.0–17.0)
MCH: 29.2 pg (ref 26.0–34.0)
MCHC: 33 g/dL (ref 30.0–36.0)
RDW: 15 % (ref 11.5–15.5)

## 2010-05-11 LAB — GLUCOSE, CAPILLARY: Glucose-Capillary: 131 mg/dL — ABNORMAL HIGH (ref 70–99)

## 2010-05-11 NOTE — Telephone Encounter (Signed)
Chart was opened in error. 

## 2010-05-12 LAB — CBC
HCT: 37.8 % — ABNORMAL LOW (ref 39.0–52.0)
MCH: 29.9 pg (ref 26.0–34.0)
MCV: 88.9 fL (ref 78.0–100.0)
RDW: 15.2 % (ref 11.5–15.5)
WBC: 13.8 10*3/uL — ABNORMAL HIGH (ref 4.0–10.5)

## 2010-05-14 NOTE — H&P (Signed)
NAMEALICIA, ACKERT NO.:  [id number]  MEDICAL RECORD NO.:  [id number]           PATIENT TYPE:  E  LOCATION:  [facility]                         FACILITY:  [facility]  PHYSICIAN:  Eduard Clos, MDDATE OF BIRTH:  1923-11-03  DATE OF ADMISSION:  04/27/2010 DATE OF DISCHARGE:                             HISTORY & PHYSICAL   PRIMARY CARE PHYSICIAN:  Gordy Savers, MD  CHIEF COMPLAINT:  Fall.  HISTORY OF PRESENTING ILLNESS:  An 77 year old male with known history of atrial fibrillation on Coumadin, history of previous CVA, history of abdominal aortic aneurysm, atrial flutter, status post ablation, history of CA of prostate and history of right hip fracture, has been experiencing pain after he had a fall yesterday.  The patient's family states that they saw the patient fall when he tried to change the cane in his hand to the other hand.  After fall, he did not have any loss of consciousness.  He has experienced some pain.  He was brought to the ER. In the ER, the patient had x-ray, which are showing pubic rami fracture. CT of the right hip also was done to further study the fracture, which shows mildly displaced right inferior pubic rami fracture with a questionable fracture through the junction of the right trapezia, pubic rami and ischium.  There was no hematoma.  The patient at this time is not able to bear weight on the leg and difficult to walk.  The patient has been admitted for further observation.  The patient denies any chest pain, nausea, vomiting, shortness of breath, abdominal pain, any dysuria, discharge, diarrhea or any headache or visual symptoms or loss of consciousness or focal deficits.  PAST MEDICAL HISTORY: 1. History of previous CVA. 2. History of CA of prostate. 3. History of hypertension. 4. History of atrial flutter, status post ablation. 5. History of hypertension. 6. Depression.  PAST SURGICAL HISTORY:  Has had appendectomy,  cholecystectomy or ablation for atrial flutter, and sheets for prostate cancer.  Medications prior on admission, which has to be confirmed includes furosemide, Klor-Con, [place], metoprolol, pravastatin, sertraline, all of which doses are not known.  ALLERGIES:  No known drug allergies.  FAMILY HISTORY:  Significant for coronary artery disease.  SOCIAL HISTORY:  The patient lives with his family.  Denies smoking cigarette.  Drinking alcohol or use illegal drugs.  Full code.  REVIEW OF SYSTEMS:  As per history of presenting illness, nothing else significant.  PHYSICAL EXAMINATION:  GENERAL:  The patient examined at bedside, not in acute distress. VITAL SIGNS:  Blood pressure 135/67, pulse at this time is around 90-95 per minute, temperature 97.4, respirating 18 per minute, O2 sat 93%. HEENT:  Anicteric.  No pallor.  No obvious injury to his scalp or head. No facial asymmetry.  Tongue is midline. CHEST:  Bilateral air entry present.  No rhonchi.  No crepitation. HEART:  S1-S2 heard. ABDOMEN:  Soft and nontender.  At this time, I do not see any pulsatile mass.  No guarding or rigidity. CNS:  Alert, awake, oriented to time, place and person.  Moves upper and lower  extremities. EXTREMITIES:  At this time, I do not see any hematoma and we are able to flex his leg.  Completely without any pain.  There is no acute ischemic changes or cyanosis or clubbing.  LABORATORY DATA:  EKG shows atrial fibrillation with a rate around 111 beats per minute.  On palpation, his pulses are around 90-95 per minute at this time with nonspecific ST-T changes.  CT of hip right without contrast shows mildly displaced right inferior pubic rami fracture, questionable nondisplaced fracture through the junction of the right superior pubic ramus and ischium.  No definite proximal right femoral fracture, abdominal aortic aneurysm 3.1 cm in diameter suspect larger above, but the more proximal portions of the  aorta were not imaged. Recommend followup CT and ultrasound imaging of the abdomen to establish the size of the abdominal aorta and recent degenerative disk and facet disease change of the left lower lumbar spine, brachytherapy and seed implants in the prostate bed.  X-ray of the right knee shows osseous demyelination with tricompartmental osteoarthritic changes of the right knee.  No acute osseous abnormality and hip x-ray suspect inferior right pubic rami fracture, osseous demyelination without definite additional fracture, which provides brachytherapy, seed implants in the prostate bed, significant osseous demyelination.  Chest x-ray shows emphysematous and postsurgical changes above with mild right distal atelectasis with minimal interstitial prominence versus previous study cannot exclude minimal edema.  CT of the head without contrast shows atrophia with small-vessel chronic ischemic changes of the deep cerebral white matter and no acute intracerebral abnormality, sinus disease changes as above. CBC; WBC is 4.5, hemoglobin 14.5, hematocrit 41.5, platelets 203, neutrophils of 80%.  PT/INR is 24.5 and 2.19.  Basic metabolic panel; sodium 135, potassium 3.4, chloride 100, carbon dioxide 24, glucose 164, BUN 6, creatinine 0.7, calcium 8.4.  CK-MB less than 1, troponin 0.09, myoglobin 170.  UA is negative.  ASSESSMENT: 1. Fall with mildly displaced right inferior pubic rami fracture. 2. Difficulty walking secondary to #1 reason. 3. History of atrial fibrillation, on Coumadin, therapeutic. 4. History of previous cerebrovascular accident. 5. History of atrial fibrillation, status post ablation. 6. History of hyperlipidemia. 7. History of hypertension. 8. History of hyperlipidemia.  PLAN: 1. At this time, I will admit the patient to telemetry given his     history of AFib to monitor for heart rate. 2. Difficulty walking secondary to fracture.  We will get CT, we     reconsult, also  get ortho recommendations.  At this time, I am     going to continue his Coumadin as the patient has had previous CVA     until unless the patient is having any risk for forming hematoma. 3. Abdominal aortic aneurysm.  At this time, the patient is not     complaining of any abdominal pain.  I am going to     get a sonogram of the abdomen to make sure there is no increase in     size of his AAA. 4. Immediately get his home medication and continue if appropriate. 5. Further recommendation based on the test orders and clinical     course, and consults and recommendations.     Eduard Clos, MD     ANK/MEDQ  D:  04/27/2010  T:  04/27/2010  Job:  [id number]  cc:   Gordy Savers, MD  Electronically Signed by Midge Minium MD on 05/14/2010 05:07:59 PM

## 2010-05-15 NOTE — Consult Note (Signed)
Summary: [facility]: Consultation Report  [facility]: Consultation Report   Imported By: Earl Many 05/10/2010 13:27:02  _____________________________________________________________________  External Attachment:    Type:   Image     Comment:   External Document

## 2010-05-24 ENCOUNTER — Telehealth: Payer: Self-pay | Admitting: Cardiology

## 2010-05-25 ENCOUNTER — Telehealth: Payer: Self-pay | Admitting: Internal Medicine

## 2010-05-25 NOTE — Telephone Encounter (Signed)
Called and gave order [facility]

## 2010-05-25 NOTE — Telephone Encounter (Signed)
Triage vm------pt is receiving home physical therapy and the therapist is recommending a bedside commode to help with transfers to and from the commode safely. Call back with a verbal order.

## 2010-05-28 ENCOUNTER — Encounter: Payer: Self-pay | Admitting: Cardiology

## 2010-05-29 NOTE — Letter (Signed)
Summary: [facility] Discharge Summary  [facility] Discharge Summary   Imported By: Earl Many 05/25/2010 10:02:52  _____________________________________________________________________  External Attachment:    Type:   Image     Comment:   External Document

## 2010-05-29 NOTE — Progress Notes (Signed)
Summary: pt's dtr calling re pt having sob/dizziness  Phone Note Call from Patient   Caller: [phone] dtr [name] Reason for Call: Talk to Nurse Summary of Call: pt's dtr having problems with pt-sob and dizzy- Initial call taken by: Glynda Jaeger,  May 24, 2010 1:15 PM  Follow-up for Phone Call        spoke with pt dtr, will send an order to home health for protime checks. the home health nurse also questions if a dig and bnp level need to be checked since discharge from the hosp. dtr reports dizziness when the pt sits on the side of the bed or when standing. he also has alittle SOB that started yesterday. he has no swelling. his bp running 136/116-78 and his pulse is 70 to 59. his o2 sat is 92%. medication confirmed with dtr. follow up appt post hosp made. will foward for dr Jens Som review Deliah Goody, RN  May 24, 2010 2:14 PM   Additional Follow-up for Phone Call Additional follow up Details #1::        bmet, bnp Ferman Hamming, MD, [facility]  May 24, 2010 3:02 PM  [facility], lab orders given. dtr aware. Deliah Goody, RN  May 24, 2010 5:11 PM     New/Updated Medications: DILTIAZEM HCL ER BEADS 180 MG XR24H-CAP (DILTIAZEM HCL ER BEADS) Take one capsule by mouth daily METOPROLOL TARTRATE 50 MG TABS (METOPROLOL TARTRATE) Take one tablet by mouth twice a day DIGOXIN 0.125 MG TABS (DIGOXIN) Take a half  tablet by mouth daily DIGOXIN 0.125 MG TABS (DIGOXIN) Take one tablet by mouth daily

## 2010-05-31 NOTE — Discharge Summary (Signed)
[name], [name] NO.:  [id number]  MEDICAL RECORD NO.:  [id number]           PATIENT TYPE:  I  LOCATION:  3706                         FACILITY:  [facility]  PHYSICIAN:  Pleas Koch, MD        DATE OF BIRTH:  [birth year]  DATE OF ADMISSION:  04/27/2010 DATE OF DISCHARGE:  05/04/2010                              DISCHARGE SUMMARY   DISCHARGE DIAGNOSES: 1. Nonsustained ventricular tachycardia. 2. Status post ventilator-dependent respiratory failure, status post     intubation on May 01, 2010, and status post extubation on     May 03, 2010. 3. Atrial fibrillation with rapid ventricular response and status post     radiofrequency ablation in the past, followed by Dr. Jens Som for     rate control. 4. History of triple aortic aneurysm. 5. Depression. 6. History of cerebrovascular accident. 7. Nondisplaced pelvic fracture. 8. Impaired fasting glucose. 9. Leukocytosis.  DISCHARGE MEDICATIONS: 1. Pravastatin 40 mg nightly. 2. Lasix 40 mg 1/2 tablet q.p.m. 3. Aspirin 81 mg nightly. 4. Klor-Con 20 mEq 1 tablet q.a.m.  New medications are: 1. Thick-It food thickener 6 g p.r.n. 2. Digoxin 0.125 mg p.o. daily. 3. ProSource no-carb liquid 30 mL b.i.d. 4. Ensure clinical strength 237 mL t.i.d. with meals. 5. Sertraline 25 mg 1 tablet q.a.m.  Medication changes include: 1. Diltiazem was changed from 240-180 mg extended release. 2. Metoprolol XL succinate p.o. 2 tablets nightly was changed to     Lopressor 50 mg b.i.d.  PERTINENT CONSULTS WHILE IN HOSPITAL: 1. Leonides Grills, MD, of Orthopedic Surgery. 2. Jonelle Sidle, MD, of Cardiology.  PERTINENT RADIOLOGICAL FINDINGS:  CT of the head without contrast on April 27, 2010, showed atrophy and small vessel chronic ischemic changes of the cerebral white matter.  No acute intracranial abnormality.  Sinus disease changes.  Two-view chest x-ray done on December 28, 2010, showed emphysematous  postsurgical changes with right basilar atelectasis.  Hip x-rays done on the same day showed suspect inferior right pubic ramus fracture, osseous demineralization without definite fracture or osteomyelitis, brachytherapy, seed implant in prostate bed with significant osseous demineralization.  X-ray of the knee on the same day showed osseous demineralization with tricompartmental osteoarthritic changes in the right knee, no acute osseous abnormalities.  CT of hip showed questionable displaced right inferior pubic ramus, questionable displaced fracture of the junction of right superior pubic ramus and right superior pubic ramus, no definite proximal right femoral fracture.  Abdominal aortic aneurysm 3.1 cm on imaging.  Degenerative disk and facet changes of the lower lumbar spine, brachytherapy, seed implant in prostate bed.  Ultrasound of aorta showed abdominal aneurysm 4.0 x 4.4 cm and extending 5.5-cm left mild aneurysmal dilatation of common iliac arteries bilaterally with bilateral renal cysts.  Portable abdominal x-ray showed gaseous distention of bowel loops, thought predominantly small bowel with possible ileus.  Series of x-rays are from April 29, 2010, showing abdominal x-ray, little interval change findings consistent with ileus/partial  small bowel obstruction.  X-rays done on April 30, 2010, include abdominal portable which showed marked degree of motion artifact, persistent with multiple  dilated loops of bowel, maybe related to ileus.  Chest x-ray showed emphysematous bibasilar airspace disease, which could be atelectasis/pneumonia.  X-rays done on May 01, 2010, portable chest x-ray showed increased bibasilar atelectasis, pneumonia, stable cardiomegaly, pulmonary vascular congestion, mild interstitial lung disease.  On same day, his abdominal x-ray showed improved colonic ileus.  Portable chest x-ray on May 01, 2010, showed endotracheal tube tip at  the level of clavicles and that courses to the abdomen, mildly improved ventilation with residual nodular bibasilar opacity.  On May 02, 2010, chest x-ray showed cardiomegaly, probably mild pulmonary vascular congestion and bibasilar atelectasis.  Chest x-ray next daytime on May 03, 2010, showed bibasilar atelectasis, decreased lung volumes.  Abdominal x-ray on May 03, 2010, showed mild gas distention of colon suggesting ileus.  On May 04, 2010, showed portable chest x- ray, endotracheal tube and nasogastric tube removed with atelectasis.  Chest x-ray, 2-view, on May 06, 2010, showed mildly improved aeration.  Ultrasound of abdomen on May 10, 2010, showed cholelithiasis without other ultrasonic evidence of cholecystitis or biliary obstruction, 4-cm aortic aneurysm, stable bilateral renal cysts.  Chest x-ray done on May 11, 2010, showed stable chronic changes with improvement of aeration on the left.  PERTINENT PROCEDURES:  The patient was intubated with endotracheal tube from May 01, 2010, to May 03, 2010.  Antibiotics, he received vancomycin from May 01, 2010, to May 03, 2010, as well as Zosyn from May 01, 2010, to May 03, 2010. He was ceftriaxone as well and this was transitioned to Augmentin which he completed a 10-day course of in the hospital.  The patient was kept on the __________ protocol.  BRIEF HISTORY AND PHYSICAL:  Please see the detailed dictations on April 27, 2010; April 28, 2010; April 29, 2010, from Pharmacologist and Surveyor, quantity.  Briefly, this is an 86-year male with known history of AFib on Coumadin, history of previous CVA, history of abdominal aortic aneurysm, atrial flutter status post ablation, history of carcinoma of prostate, and history of right hip fracture having extreme pain after a fall yesterday.  Family states they saw the patient fell when he tried to change the cane from one  hand to the other hand, did not have any loss of consciousness.  He was brought to the ED, had an x-ray showing pubic rami fracture.  He had a CT showing mildly displaced right inferior pubic ramus fracture with questionable fracture through junction of right trapezius, pubic rami, and ischium with no hematoma.  He was not really able to bear weight and the patient was admitted for the same.  HOSPITAL COURSE:  Extensive, but briefly; for the fracture Orthopedics was consulted.  They recommended weightbearing as tolerated and Dr. Lestine Box recommended him being followed up as an outpatient with Orthopedics.  The patient went into nonsustained VT and rapid AFib, likely secondary to him being off his scheduled medications because of an ileus which was most likely secondary to not being able to take p.o. and poor p.o. intake.  He was kept on Cardizem drip and then subsequently developed increasing difficulty and shortness of breath.  The patient was transferred to ICU and was intubated from May 01, 2010, to  May 03, 2010, and then subsequently extubated.  It was noted that he had pneumonia.  He was on antibiotics as detailed above. He completed a 10-day course of Augmentin while in the hospital and Speech Therapy has seen.  They recommended dysphagia III diet and to graduate thin liquids.  He was  also placed on Thick-It for this.  He would receive prednisone until May 10, 2010, which may account for his leukocytosis as the patient is not been febrile during his course back on the floor subsequent to discharge from the ICU.  History of triple aortic aneurysm.  The patient had a CT scan as detailed above as well as ultrasound which showed he had stable-sized aneurysm.  The last study was done on May 10, 2010, and this will need to be followed up as an outpatient.  Depression.  It is likely related to depression sleepiness.  The patient was somewhat more somnolent while  on the floor, this may be related post- ICU delirium versus part of his depression.  He was more active and in fact more conversant on the day of discharge and hence he was thought to be stable to go home.  AFib with RVR.  Cardiology did see the patient and recommended that he be started on digoxin at low dose.  They have adjusted his meds as dictated above.  The patient will need to follow up with Cardiology in 1- 2 weeks to ensure that this remained stable and that his AFib remains well controlled as his digoxin level was subtherapeutic while he was in hospital.  History of CVA.  This is stable and likely secondary to AFib that he has had.  Impaired fasting glucose.  His glucoses while in the hospital were slightly elevated to 120s-150s and he will need outpatient followup. This may likely be secondary to his steroid use.  The patient's family wished for him to be discharged home with possible oxygen and home health.  We will ambulate the patient prior to discharge and see if he does need requirements for home O2.  VITAL SIGNS ON DISCHARGE:  Temperature 97.9, pulse rate 97 in AFib but rate controlled, blood pressure is 103-124/62-71 and the patient is saturating 97% on 2 liters nasal cannula.  His INR is therapeutic at 2.58 and hemoglobin and hematocrit 12.7 and 37.8, white count 13.8, and platelets 252.  The patient was discharged in a stable state.          ______________________________ Pleas Koch, MD     JS/MEDQ  D:  05/12/2010  T:  05/12/2010  Job:  [id number]  cc:   Gordy Savers, MD Leonides Grills, M.D. Madolyn Frieze Jens Som, MD, [facility] Jonelle Sidle, MD  Electronically Signed by Pleas Koch MD on 05/31/2010 04:34:43 PM

## 2010-06-01 ENCOUNTER — Telehealth: Payer: Self-pay | Admitting: Internal Medicine

## 2010-06-01 DIAGNOSIS — Z7409 Other reduced mobility: Secondary | ICD-10-CM

## 2010-06-01 NOTE — Telephone Encounter (Signed)
Pts daughter called and said that she is trying to get an in home nurse through [facility]. Social Worker was suppose to come by to see pt, but no one has come out, except for occupational therapist and physical therapist. Pts daughter in needing some advise and help from Dr Amador Cunas asap. Pls call.

## 2010-06-03 NOTE — Consult Note (Signed)
  NAMEELDER, DAVIDIAN NO.:  [id number]  MEDICAL RECORD NO.:  [id number]           PATIENT TYPE:  I  LOCATION:  3731                         FACILITY:  [facility]  PHYSICIAN:  Leonides Grills, M.D.     DATE OF BIRTH:  January 24, 1924  DATE OF CONSULTATION:  04/28/2010 DATE OF DISCHARGE:                                CONSULTATION   CHIEF COMPLAINT:  Right hip pain.  HISTORY:  This is an 77 year old male who fell on April 26, 2010, on to his right side.  He had immediate right hip pain.  He was then taken to [facility] ED, x-rays were obtained.  He was admitted to the hospitalist service.  It was found on CT scan of his pelvis that he had a nondisplaced right inferior and superior pubic ramus fracture, and we were consulted for further evaluation and treatment.  PAST MEDICAL HISTORY:  Abdominal aortic aneurysm, AFib, hyperlipidemia, hypertension, prostate cancer, and CVA.  SOCIAL HISTORY:  He does not smoke.  MEDICATIONS: 1. Furosemide. 2. Klor-Con. 3. __________ 4. Metoprolol. 5. Pravastatin.  ALLERGIES:  No known drug allergies.  He denies any fever, chills, history of rheumatoid arthritis, gout, lupus, diabetes, peripheral neuropathy.  PHYSICAL EXAMINATION:  VITAL SIGNS:  He is afebrile, blood pressure is 135/70, pulse is in 90s, and respirations 18. GENERAL:  Well-nourished, well-developed, in no apparent distress, very pleasant gentleman. HEENT:  He is normocephalic, atraumatic.  Extraocular motions are intact. CHEST:  Equal bilaterally to expansion.  No retraction. EXTREMITIES:  He has tenderness on the anterior aspect of the right pelvic girdle area.  Range of motion of the hip refers pain in this area.  This was just gentle range of motion and internal-external range of motion.  He is nontender over bilateral thighs, knees, legs, ankles, feet, shoulders, elbows, forearms, wrist and hand.  Palpable radial pulse bilateral.  Palpable dorsalis pedis  pulse bilaterally.  Active and passive range of motion of toes.  IMAGING:  CT scan of the pelvis was reviewed, it shows an essentially nondisplaced superior and inferior pubic ramus fracture on the right side, SI joint in stable.  IMPRESSION:  Right nondisplaced superior and inferior pubic ramus fracture.  PLAN:  At this point, patient is weightbearing as tolerating.  They are to follow up with [place] in 3 weeks.  Likely, will need a walker for comfort. We went over in great detail with the patient as well as the daughter, and all questions were encouraged and answered.     Leonides Grills, M.D.     PB/MEDQ  D:  04/28/2010  T:  04/29/2010  Job:  [id number]  Electronically Signed by Leonides Grills M.D. on 06/03/2010 08:32:03 AM

## 2010-06-04 NOTE — Telephone Encounter (Signed)
Called daughter - PT has been out today to start a PT program - daughter is requesting and order for [facility] to come out , eval for homehealth aid to come to home 3-4 times weekly to assist with ADLs. .  Please advise  Also daughter wanted you to know he sees Dr. Jens Som tomorrow for PT/INR ,first visit since hospital discharge.

## 2010-06-04 NOTE — Telephone Encounter (Signed)
Please advise 

## 2010-06-04 NOTE — Telephone Encounter (Signed)
All ok

## 2010-06-05 ENCOUNTER — Ambulatory Visit (INDEPENDENT_AMBULATORY_CARE_PROVIDER_SITE_OTHER): Payer: Medicare Other | Admitting: *Deleted

## 2010-06-05 ENCOUNTER — Encounter: Payer: Self-pay | Admitting: Cardiology

## 2010-06-05 ENCOUNTER — Ambulatory Visit: Payer: MEDICARE | Admitting: Cardiology

## 2010-06-05 ENCOUNTER — Ambulatory Visit (INDEPENDENT_AMBULATORY_CARE_PROVIDER_SITE_OTHER): Payer: Medicare Other | Admitting: Cardiology

## 2010-06-05 DIAGNOSIS — I10 Essential (primary) hypertension: Secondary | ICD-10-CM

## 2010-06-05 DIAGNOSIS — E7800 Pure hypercholesterolemia, unspecified: Secondary | ICD-10-CM

## 2010-06-05 DIAGNOSIS — Z7901 Long term (current) use of anticoagulants: Secondary | ICD-10-CM | POA: Insufficient documentation

## 2010-06-05 DIAGNOSIS — I6529 Occlusion and stenosis of unspecified carotid artery: Secondary | ICD-10-CM

## 2010-06-05 DIAGNOSIS — I4891 Unspecified atrial fibrillation: Secondary | ICD-10-CM

## 2010-06-05 DIAGNOSIS — I714 Abdominal aortic aneurysm, without rupture: Secondary | ICD-10-CM

## 2010-06-05 DIAGNOSIS — I701 Atherosclerosis of renal artery: Secondary | ICD-10-CM

## 2010-06-05 LAB — POCT INR: INR: 3.9

## 2010-06-05 LAB — CONVERTED CEMR LAB: Prothrombin Time: 28.4 s

## 2010-06-05 NOTE — Patient Instructions (Signed)
Skip today's dosage of Coumadin, then start taking 1/2 tablet daily.  Recheck in 2 weeks.

## 2010-06-05 NOTE — Assessment & Plan Note (Signed)
Rate is controlled. Continue present medications. Continue Coumadin. INR monitored in the Coumadin clinic. If recurrent falls in the future will need to consider discontinuing Coumadin.

## 2010-06-05 NOTE — Assessment & Plan Note (Signed)
Followup ultrasound February 2013.

## 2010-06-05 NOTE — Assessment & Plan Note (Signed)
Blood pressure controlled on present medications. Will continue. 

## 2010-06-05 NOTE — Patient Instructions (Signed)
FOLLOW UP WITH DR CRENSHAW IN 6 MONTHS. YOU WILL RECEIVE A LETTER WHEN THAT SCHEDULE IS AVAILABLE. 

## 2010-06-05 NOTE — Telephone Encounter (Signed)
Spoke with daughter - informed Dr. Amador Cunas ok'd order for [facility] to come out and eval and treat - homehealth- aide service 3-4 weekly Order placed and terrie will call once set up or has any questions

## 2010-06-05 NOTE — Assessment & Plan Note (Signed)
Continue statin. Lipids and liver followed by primary care. 

## 2010-06-05 NOTE — Assessment & Plan Note (Signed)
Followup abdominal ultrasound February 2013.

## 2010-06-05 NOTE — Assessment & Plan Note (Signed)
Followup carotid Dopplers February 2014.

## 2010-06-05 NOTE — Telephone Encounter (Signed)
Addended by: Duard Brady on: 06/05/2010 10:25 AM   Modules accepted: Orders

## 2010-06-05 NOTE — Progress Notes (Signed)
HPI:Mr. Campusano is a pleasant  gentleman with hypertension, history of atrial flutter ablation, and vascular disease.  He also has atrial fibrillation that we are treating with rate control and anticoagulation.  Previous cardiac catheterization in October 2006 showed nonobstructive coronary disease and an ejection fraction of 55%. There was a 90% left and 75% right renal artery stenosis. He had previous stenting of his left renal artery in October of 2007. Last echocardiogram in Feb 2012 showed nl LV function.  There was mild mitral regurgitation.  The left atrium was moderately dilated.  The right was markedly dilated.  The right ventricle was mildly dilated. Carotid Dopplers in February of 2012 showed 0-39% stenosis bilaterally and F/U recommended in 2 years. Last abdominal ultrasound in Feb 2012 showed AAA of  3.8 x 3.8 cm and 1-59% bilateral RAS. Recently admitted after fall and hip fracture; course complicated by pneumonia. Since DC, the patient denies any dyspnea on exertion, orthopnea, PND, pedal edema, palpitations, syncope or chest pain. His strength is slowly improving.   Current Outpatient Prescriptions  Medication Sig Dispense Refill  . warfarin (COUMADIN) 5 MG tablet Take by mouth as directed.           No past medical history on file.  No past surgical history on file.  History   Social History  . Marital Status: Widowed    Spouse Name: N/A    Number of Children: N/A  . Years of Education: N/A   Occupational History  . Not on file.   Social History Main Topics  . Smoking status: Not on file  . Smokeless tobacco: Not on file  . Alcohol Use: Not on file  . Drug Use: Not on file  . Sexually Active: Not on file   Other Topics Concern  . Not on file   Social History Narrative  . No narrative on file    ROS: no fevers or chills, productive cough, hemoptysis, dysphasia, odynophagia, melena, hematochezia, dysuria, hematuria, rash, seizure activity, orthopnea, PND, pedal  edema, claudication. Remaining systems are negative.  Physical Exam: Well-developed frail in no acute distress.  Skin is warm and dry.  HEENT is normal.  Neck is supple. No thyromegaly.  Chest is clear to auscultation with normal expansion.  Cardiovascular exam is irregular Abdominal exam nontender or distended. No masses palpated. Extremities show no edema. neuro grossly intact

## 2010-06-08 ENCOUNTER — Other Ambulatory Visit: Payer: Self-pay

## 2010-06-08 MED ORDER — METOPROLOL TARTRATE 50 MG PO TABS: 50.0000 mg | ORAL_TABLET | Freq: Two times a day (BID) | ORAL | Status: DC

## 2010-06-15 ENCOUNTER — Telehealth: Payer: Self-pay

## 2010-06-15 NOTE — Telephone Encounter (Signed)
SPOKE WITH JENNA - STILL HAVEN'T HEARD [facility] - OR ANYONE IF THIS IS EVEN POSSIBLE. i WILL F/U WITH TERRI IN OUR OFFICE - REFERAL WAS PUT IN AWHILE AGO.   THERE ARE 2 CARDIO MEDS HE WAS DC'D FROM HOSPITAL ON AND INSTRUCTED TO STAY ON FOR 30 DAYS - I ASK JENNA TO CALL CARD[facility] AND ASK THEN TO INSTRUCT THEM AS TO CONTINUE OR NOT. kik

## 2010-06-18 ENCOUNTER — Encounter: Payer: Medicare Other | Admitting: *Deleted

## 2010-06-18 ENCOUNTER — Other Ambulatory Visit: Payer: Self-pay | Admitting: *Deleted

## 2010-06-18 MED ORDER — DIGOXIN 125 MCG PO TABS: 125.0000 ug | ORAL_TABLET | Freq: Every day | ORAL | Status: DC

## 2010-06-18 MED ORDER — DILTIAZEM HCL ER COATED BEADS 180 MG PO CP24: 180.0000 mg | ORAL_CAPSULE | Freq: Every day | ORAL | Status: DC

## 2010-06-19 ENCOUNTER — Telehealth: Payer: Self-pay | Admitting: Cardiology

## 2010-06-19 ENCOUNTER — Ambulatory Visit (INDEPENDENT_AMBULATORY_CARE_PROVIDER_SITE_OTHER): Payer: Medicare Other | Admitting: *Deleted

## 2010-06-19 DIAGNOSIS — Z7901 Long term (current) use of anticoagulants: Secondary | ICD-10-CM

## 2010-06-19 DIAGNOSIS — I4891 Unspecified atrial fibrillation: Secondary | ICD-10-CM

## 2010-06-19 LAB — POCT INR: INR: 3.1

## 2010-06-19 NOTE — Patient Instructions (Signed)
Skip today's dosage of Coumadin, then resume same dosage 1/2 tablet daily.  Recheck in 3 weeks.

## 2010-06-19 NOTE — Telephone Encounter (Signed)
Pt daughter states pt blood pressure 149/100. Pt daughter wants to know what she needs to do. No chest pain. Pt having dizziness and sob.

## 2010-06-19 NOTE — Telephone Encounter (Signed)
Patient's daughter wants to talk to no one but Stanton Kidney Mathis-transferred call to her.

## 2010-06-20 LAB — COMPREHENSIVE METABOLIC PANEL
ALT: 9 U/L (ref 0–53)
AST: 27 U/L (ref 0–37)
Albumin: 3.4 g/dL — ABNORMAL LOW (ref 3.5–5.2)
CO2: 27 mEq/L (ref 19–32)
Chloride: 90 mEq/L — ABNORMAL LOW (ref 96–112)
GFR calc Af Amer: >60 mL/min (ref >60)
GFR calc non Af Amer: >60 mL/min (ref >60)
Potassium: 2.4 mEq/L — CL (ref 3.5–5.1)
Sodium: 125 mEq/L — ABNORMAL LOW (ref 135–145)
Total Bilirubin: 0.5 mg/dL (ref 0.3–1.2)

## 2010-06-20 LAB — URINALYSIS, ROUTINE W REFLEX MICROSCOPIC
Ketones, ur: 15 mg/dL — AB
Nitrite: POSITIVE — AB
Protein, ur: >300 mg/dL — AB
Urobilinogen, UA: 1 mg/dL (ref 0.0–1.0)
pH: 6 (ref 5.0–8.0)

## 2010-06-20 LAB — DIFFERENTIAL
Basophils Absolute: 0 10*3/uL (ref 0.0–0.1)
Eosinophils Absolute: 0.3 10*3/uL (ref 0.0–0.7)
Eosinophils Relative: 3 % (ref 0–5)
Lymphs Abs: 1.4 10*3/uL (ref 0.7–4.0)
Monocytes Absolute: 0.8 10*3/uL (ref 0.1–1.0)

## 2010-06-20 LAB — CBC
Platelets: 222 10*3/uL (ref 150–400)
RBC: 4.85 MIL/uL (ref 4.22–5.81)
WBC: 10 10*3/uL (ref 4.0–10.5)

## 2010-06-20 LAB — URINE CULTURE: Colony Count: 95000

## 2010-06-20 LAB — URINE MICROSCOPIC-ADD ON

## 2010-06-20 LAB — POCT I-STAT, CHEM 8
Calcium, Ion: 1.09 mmol/L — ABNORMAL LOW (ref 1.12–1.32)
Chloride: 96 mEq/L (ref 96–112)
Creatinine, Ser: 0.7 mg/dL (ref 0.4–1.5)
Glucose, Bld: 143 mg/dL — ABNORMAL HIGH (ref 70–99)
HCT: 41 % (ref 39.0–52.0)
Potassium: 2.9 mEq/L — ABNORMAL LOW (ref 3.5–5.1)

## 2010-06-20 LAB — PROTIME-INR: Prothrombin Time: 32.1 seconds — ABNORMAL HIGH (ref 11.6–15.2)

## 2010-07-02 ENCOUNTER — Encounter: Payer: Self-pay | Admitting: Internal Medicine

## 2010-07-03 ENCOUNTER — Ambulatory Visit (INDEPENDENT_AMBULATORY_CARE_PROVIDER_SITE_OTHER): Payer: Medicare Other | Admitting: Internal Medicine

## 2010-07-03 ENCOUNTER — Encounter: Payer: Self-pay | Admitting: Internal Medicine

## 2010-07-03 DIAGNOSIS — I428 Other cardiomyopathies: Secondary | ICD-10-CM

## 2010-07-03 DIAGNOSIS — M1990 Unspecified osteoarthritis, unspecified site: Secondary | ICD-10-CM

## 2010-07-03 DIAGNOSIS — E7800 Pure hypercholesterolemia, unspecified: Secondary | ICD-10-CM

## 2010-07-03 DIAGNOSIS — J449 Chronic obstructive pulmonary disease, unspecified: Secondary | ICD-10-CM

## 2010-07-03 DIAGNOSIS — I4891 Unspecified atrial fibrillation: Secondary | ICD-10-CM

## 2010-07-03 DIAGNOSIS — I714 Abdominal aortic aneurysm, without rupture: Secondary | ICD-10-CM

## 2010-07-03 MED ORDER — DICLOFENAC SODIUM 3 % TD GEL: 1.0000 "application " | Freq: Two times a day (BID) | TRANSDERMAL | Status: DC

## 2010-07-03 NOTE — Patient Instructions (Signed)
Limit your sodium (Salt) intake    It is important that you exercise regularly, at least 20 minutes 3 to 4 times per week.  If you develop chest pain or shortness of breath seek  medical attention.  Return in 6 months for follow-up  

## 2010-07-03 NOTE — Progress Notes (Signed)
  Subjective:    Patient ID: [name], male    DOB: 13-Sep-1923, 77 y.o.   MRN: [id number]  HPI  77 year old patient who is seen today following a hospital discharge. He was admitted approximately 2 months ago for nonsustained ventricular tachycardia. He had ventilatory dependent inspiratory failure. Hospital records were reviewed the nature fibrillation with rapid ventricular response he has had prior RFA in the past he also has a history of abdominal aortic aneurysm depression and cerebrovascular disease he has dyslipidemia. Hospital records and discharge medications reviewed. Since his discharge he has done quite well. He is accompanied by his son. He denies any cardiopulmonary complaints. Medical regimen now includes diltiazem and metoprolol    Review of Systems  Constitutional: Positive for fatigue. Negative for fever, chills and appetite change.  HENT: Negative for hearing loss, ear pain, congestion, sore throat, trouble swallowing, neck stiffness, dental problem, voice change and tinnitus.   Eyes: Negative for pain, discharge and visual disturbance.  Respiratory: Negative for cough, chest tightness, wheezing and stridor.   Cardiovascular: Negative for chest pain, palpitations and leg swelling.  Gastrointestinal: Negative for nausea, vomiting, abdominal pain, diarrhea, constipation, blood in stool and abdominal distention.  Genitourinary: Negative for urgency, hematuria, flank pain, discharge, difficulty urinating and genital sores.  Musculoskeletal: Positive for gait problem. Negative for myalgias, back pain, joint swelling and arthralgias.  Skin: Negative for rash.  Neurological: Positive for weakness. Negative for dizziness, syncope, speech difficulty, numbness and headaches.  Hematological: Negative for adenopathy. Does not bruise/bleed easily.  Psychiatric/Behavioral: Negative for behavioral problems and dysphoric mood. The patient is not nervous/anxious.        Objective:   Physical Exam  Constitutional: He is oriented to person, place, and time. He appears well-developed.  HENT:  Head: Normocephalic.  Right Ear: External ear normal.  Left Ear: External ear normal.  Eyes: Conjunctivae and EOM are normal.  Neck: Normal range of motion.  Cardiovascular: Normal rate and normal heart sounds.        Irregular rhythm with a controlled ventricular response  Pulmonary/Chest: Breath sounds normal.  Abdominal: Bowel sounds are normal.  Musculoskeletal: Normal range of motion. He exhibits no edema and no tenderness.  Neurological: He is alert and oriented to person, place, and time.  Psychiatric: He has a normal mood and affect. His behavior is normal.          Assessment & Plan:   Atrial  fibrillation History of nonsustained ventricular tachycardia  Depression Abdominal aortic aneurysm. Hypertension  Medical records reviewed and new medications updated. Patient is clinically stable and we'll make no changes today. We'll recheck in 3 months or when necessary

## 2010-07-10 ENCOUNTER — Ambulatory Visit: Payer: MEDICARE | Admitting: Cardiology

## 2010-07-10 ENCOUNTER — Encounter: Payer: Medicare Other | Admitting: *Deleted

## 2010-07-16 ENCOUNTER — Encounter: Payer: Medicare Other | Admitting: *Deleted

## 2010-07-25 ENCOUNTER — Telehealth: Payer: Self-pay | Admitting: Cardiology

## 2010-07-25 NOTE — Telephone Encounter (Signed)
Daughter called and father having issues with blood pressure and heart rate.  She would like to speak to you.  She is concerned.

## 2010-07-26 NOTE — Telephone Encounter (Signed)
Spoke with pt dtr, she was calling concerned yesterday because all day her fathers bp and heart rate was elevated. She states he did not feel good yesterday. Today he is doing fine and his heart rate and pulse have returned to normal. She will call back with problems [name]

## 2010-07-31 NOTE — Assessment & Plan Note (Signed)
[facility]                            CARDIOLOGY OFFICE NOTE   NAME:Velazco, WILMONT OLUND                        MRN:          [id number]  DATE:04/07/2008                            DOB:          1923/10/29    HISTORY:  Mr. Raisanen is a very pleasant gentleman who has a history of  vascular disease, hypertension, and atrial flutter ablation.  He did  have a cardiac catheterization performed on January 15, 2005.  He had  nonobstructive coronary artery disease and preserved LV function.  His  last echocardiogram was performed on April 02, 2005.  His LV function  was normal.  There was trivial, mitral, and tricuspid regurgitation.  His last abdominal ultrasound on October 15, 2007.  His abdominal aortic  aneurysm was 3.6 x 3.8 cm.  There was probable stable 1-59% bilateral  renal artery stenosis.  Multiple cysts were noted in both kidneys.  They  are also smaller than normal.  He also had a carotid Dopplers performed  today and this showed 0-39% stenosis bilaterally.  Since I last saw him  he does have mild dyspnea on exertion with extreme activities.  However,  this has been chronic and unchanged.  This promptly relieved with rest.  It is not associated with chest pain, fevers, chills, or productive  cough.  There is no orthopnea, PND, or pedal edema.  He has not had  palpitations or exertional chest pain.  It should be noted that he  states that he has fallen several times in the past year.  However, the  last time was several months ago.  He states it typically occurs when he  tries to turn suddenly and then feels somewhat dizzy.  However, he is  corrected those actions and moves slowly down.  He has not had any  further episodes.   MEDICATIONS:  1. Lasix 40 mg p.o. daily.  2. Zocor 40 mg p.o. daily.  3. Lisinopril 40 mg p.o. daily.  4. Aspirin 81 mg p.o. daily.  5. Hydralazine 100 mg p.o. t.i.d.  6. Amlodipine 10 mg p.o. daily.  7. Clonidine 0.1 mg p.o.  b.i.d.  8. Eyedrops.   PHYSICAL EXAMINATION:  VITAL SIGNS:  Today shows a blood pressure of  120/58, the pulse is 92.  Weight is 180 pounds.  HEENT:  Normal.  NECK:  Supple.  CHEST:  Clear.  CARDIOVASCULAR:  Irregular rhythm.  ABDOMEN:  No tenderness.  EXTREMITIES:  No edema.   His electrocardiogram shows atrial fibrillation which is new.  A prior  septal infarct cannot be excluded.  Prior inferior infarct cannot be  excluded.  There were nonspecific ST changes.   DIAGNOSES:  1. New-onset atrial fibrillation - Mr. Hadden does have a history of      atrial flutter ablation, but he has not developed a new-onset      atrial fibrillation.  However, he is relatively asymptomatic with      this.  I am not adding atrioventricular nodal blocking agents as      his heart rate is controlled and he has had  problems with      bradycardic in the past when in sinus rhythm on any      atrioventricular nodal blocking agents.  He has embolic risk V      factors of age greater than 50 as well as hypertension.  I had long      discussions with him today concerning the risk and benefits of      Coumadin.  He has fallen in the past, but states that when he turns      suddenly and he has corrected those actions and he has had no      further episodes.  I have explained the risks of fall and      intracranial hemorrhage after he hits his head.  However, he does      have increased risk for an embolic cerebrovascular accident.  We      have decided to discontinue his aspirin and begin Coumadin with      goal INR of 2-3.  He will begin on 5 mg p.o. daily and we will have      him to have a PT checked in our office or [facility] office on      April 11, 2008.  The best option long-term may be to      anticoagulate the patient and proceed with cardioversion.      Hopefully, then he will maintain sinus rhythm and then discontinue      his Coumadin down the road due to the increased risk.  I have  also      instructed to him to wear to use a cane.  He understands all of the      above.  Note, we will check a CBC today.  I will also check an      echocardiogram to reassess his left ventricular function and we      will also check a TSH.  He will see me back in 4-6 weeks.  If he      does stand atrial fibrillation in the future, then we will need to      proceed with Holter monitor make sure his heart rate is adequately      control.  2. Hypertension - his blood pressures adequately control on his      present medication.  I will check a BMET.  Follow his potassium and      renal function.  3. Hyperlipidemia - he will continue on his Zocor.  We will check      lipids and liver adjust as indicated.  4. History of renal artery stenosis and abdominal aortic aneurysm - he      will need followup Dopplers in July.  5. History of small abdominal aortic aneurysm - as per above.  6. Cerebrovascular disease - he will need a followup carotid Doppler      in January of next year.  7. History of atrial flutter ablation.  8. History of syncope, felt secondary to atrial flutter with post      termination pauses undocumented.     Madolyn Frieze Jens Som, MD, [facility]  Electronically Signed    BSC/MedQ  DD: 04/07/2008  DT: 04/07/2008  Job #: [id number]

## 2010-07-31 NOTE — Consult Note (Signed)
NEW PATIENT CONSULTATION   MILLEDGE, GERDING  DOB:  April 15, 1923                                       07/28/2009  NWGNF#:[id number]   The patient presents today for evaluation of lower extremity arterial  insufficiency.  He is an active 77 year old gentleman who was seen by  Dr. Darrelyn Hillock for left knee discomfort.  He did have a steroid injection  several weeks ago with no significant improvement.  He has near constant  chronic pain in his right knee.  He denies any true claudication-type  symptoms.  It does reduce his mobility.   PAST MEDICAL HISTORY:  Significant for question of congestive heart  failure.  He does have hypertension and does have history of prostate  cancer.  Past history of a stroke in 39 and also small stroke in 2006.   FAMILY HISTORY:  His father had a heart attack at age 53.  He has no  other premature atherosclerotic disease.   SOCIAL HISTORY:  He is widowed with 4 children.  Retired Naval architect.  He quit smoking 4 years ago.  He does not drink alcohol on a regular  basis.   REVIEW OF SYSTEMS:  Documented in his chart and was reviewed.   PHYSICAL EXAM:  General:  Well-nourished gentleman appearing stated age  of 77 in no acute distress.  Vital signs:  Blood pressure 145/91, pulse  92, respirations 18.  HEENT:  Normal.  Chest:  Clear bilaterally.  Heart:  Regular rate and rhythm.  Carotid arteries without bruits  bilaterally.  He has palpable radial, palpable femoral, palpable  popliteal pulses bilaterally.  I do not palpate pedal pulses.  Abdomen:  Nontender with no masses.  Musculoskeletal:  No major deformities or  cyanosis.  He does have a right knee brace present.  Neurologic:  Without focal weakness or paresthesias.  Skin:  Without ulcers or  rashes.   He did undergo noninvasive vascular laboratories in our office and this  did show dampened waveforms in both lower extremities from an arterial  standpoint.  Ankle-arm index is  moderately decreased at 0.77 on the  right and 0.65 on the left.   I discussed this at length with the patient and his family present.  I  do not feel that he is having any symptoms related to arterial  sufficiency.  He did undergo plain films with Dr. Darrelyn Hillock and this  showed extensive calcification.  I have discussed this with the patient  and family as well, explaining that he does have advanced  atherosclerotic changes, but does not seem to have any flow-limiting  stenosis to his femoral arteries or his popliteal arteries.  He does  seem to have more distal disease.  I do not feel that he is symptomatic  from this and it does not put him at any increased risk for limb loss.  He will continue his followup with Dr. Darrelyn Hillock and see [place] on an as-  needed basis.     Larina Earthly, M.D.  Electronically Signed   TFE/MEDQ  D:  07/28/2009  T:  07/31/2009  Job:  [id number]   cc:   Vikki Ports A. Felicity Coyer, MD  Georges Lynch Darrelyn Hillock, M.D.  Gordy Savers, MD

## 2010-07-31 NOTE — Progress Notes (Signed)
[facility]                        PERIPHERAL VASCULAR OFFICE NOTE   NAME:[name]                        MRN:          [id number]  DATE:04/30/2007                            DOB:          October 27, 1923    [name] returns for follow-up at the [facility]  office on April 30, 2007.  He has previously been followed by Dr.  Samule Ohm and has undergone left renal artery stenting.  [name] is an 12-  year-old gentleman who has had refractory hypertension for several  years.  He was found to have severely elevated left renal artery  velocities back in 2007, and he ultimately underwent angiography.  His  renal angiogram demonstrated severe left renal artery stenosis and he  was treated with a 6-mm stent by Dr. Samule Ohm.  The right renal artery had  nonobstructive disease at that time and was not treated.   [name] continues to have wide swings in his blood pressure.  He has  maintained normal renal function.  His most recent renal ultrasound was  performed on January 26 and demonstrated moderate bilateral renal artery  stenosis with velocities slightly higher on the left side compared with  the right, though velocities are nowhere near the range they were prior  to stenting as the current peak velocity on the left is 284 cm/sec. and  prior to renal stenting, the peak velocity on the left was 594 cm/sec.   CURRENT MEDICATIONS:  1. Hydralazine 50 mg t.i.d.  2. Niaspan 500 mg b.i.d.  3. Lasix 40 mg daily.  4. Simvastatin 40 mg daily.  5. Amlodipine 5 mg b.i.d.  6. Lisinopril 40 mg daily.  7. Aspirin 81 mg daily.   EXAM:  [name] is alert and oriented.  He is in no acute distress.  Weight is 180, blood pressure is 132/52 in the right arm, 138/54 in left  arm, heart rate 75, respiratory rate 16.  HEENT:  Normal.  NECK:  Normal carotid upstrokes with soft bilateral carotid bruits.  LUNGS:  Clear to auscultation bilaterally.  HEART:  Regular rate and rhythm.  ABDOMEN:  Soft, nontender, no bruits.  No organomegaly.  EXTREMITIES:  No clubbing, cyanosis or edema.   ASSESSMENT:  This is an 77 year old gentleman with bilateral renal  artery stenosis and prior left renal artery stenting.  [name] has  maintained normal renal function and has what is likely moderate  restenosis of the left renal artery.  His velocities are about same  bilaterally with peak velocities less than 300.  I think he can continue  to be managed medically at this point and we should continue to follow  him with serial ultrasonography.  He will be scheduled for a renal  ultrasound this summer for 71-month follow-up and, if stable, we could  space these out to 1 year.  I will see him back at least on a yearly basis and if there are  significant changes in his renal ultrasound, we will bring him back in  sooner for review.     Veverly Fells. Excell Seltzer, MD  Electronically  Signed    MDC/MedQ  DD: 04/30/2007  DT: 05/02/2007  Job #: [id number]

## 2010-07-31 NOTE — Assessment & Plan Note (Signed)
[facility]                            CARDIOLOGY OFFICE NOTE   NAME:[name], [name]                        MRN:          [id number]  DATE:08/01/2006                            DOB:          1923-07-02    [name] is a pleasant gentleman who has a history of vascular disease  and hypertension.  He has had previous stenting of his left renal artery  by Dr. Samule Ohm, on December 17, 2005.  He has also had a previous heart  catheterization in October 2006, that showed nonobstructive coronary  disease and normal LV function.  He has a small abdominal aortic  aneurysm as well.  Since I last saw him he has mild dyspnea on exertion  but there is no orthopnea, PND, pedal edema, palpitations, presyncope,  syncope, or exertional chest pain.  He is somewhat unsteady on his feet  at times by his report.   MEDICATIONS:  1. Aspirin 81 mg p.o. daily.  2. Zocor 40 mg p.o. daily.  3. Lisinopril 40 mg p.o. daily.  4. Hydralazine 100 mg p.o. t.i.d.  5. Lasix 40 mg p.o. daily.  6. Plavix 75 mg p.o. daily.   PHYSICAL EXAMINATION:  VITAL SIGNS:  Show a blood pressure of 167/70.  His pulse is 58.  He weighs 185 pounds.  HEENT:  Remarkable for a recent removal of a skin cancer from his nose.  He is otherwise normal.  NECK:  Supple with no bruits.  CHEST:  Clear.  CARDIOVASCULAR:  Reveals a regular rate and rhythm.  There is a 2/6  systolic ejection murmur at the left sternal border.  ABDOMEN:  Benign.  EXTREMITIES:  Show no edema.   His electrocardiogram shows a sinus bradycardia, rate of 58.  A prior  septal infarct cannot be excluded.  There is minimum criteria for LVH.   DIAGNOSES:  1. Hypertension.  His blood pressure is not adequately controlled on      his present medications.  We will add Norvasc 5 mg orally daily and      increase this as needed.  I will check a B-MET today to follow his      potassium and renal function.  2. Hyperlipidemia.  We will check  lipids and liver today and adjust as      indicated.  3. History of stent to the left renal artery in October.  We will      discontinue his Plavix but continue his aspirin.  He will need      followup ultrasound of his abdomen both for his aneurysm and his      renal arteries in September and we will arrange that.  4. Cerebrovascular disease.  He will need followup carotid Doppler      studies in January 2009.  5. History of syncopal episode felt secondary to atrial flutter with      possible post termination pauses undocumented.  6. Status post atrial flutter ablation.  7. Tobacco abuse, now resolved.  8. History of left radial head fracture with mildly elevated alkaline  phosphatase.  We will recheck his alkaline phosphatase today.   He will continue his risk factor modification and follow his blood  pressure at home and we will increase his Norvasc as indicated.   I will see him back in six months.     Madolyn Frieze Jens Som, MD, [facility]  Electronically Signed    BSC/MedQ  DD: 08/01/2006  DT: 08/01/2006  Job #: [id number]

## 2010-07-31 NOTE — Assessment & Plan Note (Signed)
[facility]                            CARDIOLOGY OFFICE NOTE   NAME:[name], [name]                        MRN:          [id number]  DATE:05/02/2008                            DOB:          13-Sep-1923    [name] is a pleasant 77 year old gentleman with hypertension, history  of atrial flutter ablation, and vascular disease.  I recently saw him on  April 07, 2008, and he was noted to have new-onset atrial  fibrillation.  We did repeat his echocardiogram on April 11, 2008.  His LV function was normal.  There was mild mitral regurgitation.  The  left atrium was moderately dilated.  The right was moderately to  markedly dilated.  The right ventricle was mildly dilated.  We also  checked blood work.  His TSH was normal.  Since that time, he feels  well.  He does have dyspnea with more extreme activities, but not with  routine activities.  It is unchanged from April 07, 2008.  It is  relieved promptly with rest.  It is not associated with chest pain,  palpitation.  He has not had syncope.  There is no pedal edema.   His medications include:  1. Lasix 40 mg p.o. daily.  2. Zocor 40 mg p.o. daily.  3. Lisinopril 40 mg p.o. daily.  4. Aspirin 81 mg p.o. daily.  5. Hydralazine 100 mg p.o. t.i.d.  6. Amlodipine 10 mg p.o. daily.  7. Clonidine 0.1 mg p.o. b.i.d.  8. Eye drops.  9. Potassium 20 mEq p.o. daily.  10.Coumadin.   Physical exam today shows a blood pressure of 166/66 and his pulse is  59.  He weighs 184 pounds.  His HEENT is normal.  His neck is supple.  Chest clear.  Cardiovascular exam reveals a regular rate and rhythm.  There is a 2/6 systolic murmur at the left sternal border.  Abdominal  exam shows no tenderness.  Extremities show no edema.   His electrocardiogram shows atrial fibrillation at a rate of 98.  There  is left axis deviation.  Prior anterior infarct cannot be excluded.   DIAGNOSES:  1. Atrial fibrillation - Mr. Dobie  remains in atrial fibrillation      today.  His rate is mildly elevated, but he has had difficulties      with bradycardia in the past when in sinus rhythm.  I will schedule      him to have a 24-hour Holter monitor to assess his rate over 24      hours.  He will continue on Coumadin with a goal INR of 2-3.  Once      he has been therapeutic for 3 consecutive weeks, we will plan to      proceed with an outpatient cardioversion.  I am not optimistic that      he will maintain sinus rhythm given his biatrial enlargement, but I      think it is worth an attempt.  If he does not hold sinus rhythm,      then it may be worthwhile to continue  with rate control and      anticoagulation as he is relatively asymptomatic.  We will also      need to follow his heart rate closely.  He may have problems with      tachycardia-bradycardia based on history and so we will need to      consider a pacemaker.  2. Hypertension - his blood pressure is mildly elevated, but he has      brought records and his systolic is typically controlled.  We will      continue his present medications.  He did have hypokalemia      recently, and we will recheck his BMET.  3. Hyperlipidemia - he will continue on his statin.  4. History of renal or stenosis and abdominal aortic aneurysm - he      needs followup Dopplers in July 2010.  5. Small abdominal aortic aneurysm - as per above.  6. Cerebrovascular disease - he will need followup carotid Dopplers in      January 2011.  7. History of atrial flutter ablation.  8. History of syncope felt secondary to atrial flutter with post      termination pauses, undocumented.   I will see him back in 3 months.     Madolyn Frieze Jens Som, MD, [facility]  Electronically Signed    BSC/MedQ  DD: 05/02/2008  DT: 05/02/2008  Job #: [id number]

## 2010-07-31 NOTE — Assessment & Plan Note (Signed)
[facility]                            CARDIOLOGY OFFICE NOTE   NAME:[name], [name]                        MRN:          [id number]  DATE:09/16/2007                            DOB:          [birth year]    [name] is an 77 year old gentleman who has a history of vascular  disease and hypertension.  He has had previous stenting of his left  renal artery by Dr. Samule Ohm in October 2007, and a previous  catheterization in October 2006 showed nonobstructive disease and he has  normal LV function.  He also has mild cerebrovascular disease and a  history of abdominal aortic aneurysm.  His most recent carotid Dopplers  were performed on April 15, 2007, and showed 0-39% bilateral internal  carotid artery stenosis.  His most recent abdominal ultrasound on  April 13, 2007, showed a stable abdominal aortic aneurysm, measuring  3.5 x 3.4 cm.  The velocities in the renal aortic ratio suggests greater  than 60% bilateral renal artery stenosis on the low end of range.  He  was seen by Dr. Excell Seltzer and it was felt that medical therapy was  recommended with followup Dopplers.  Since I last saw him, he has not  had chest pain, dyspnea, or syncope, and there is no pedal edema.   MEDICATIONS:  1. Lasix 40 mg p.o. daily.  2. Zocor 40 mg p.o. daily.  3. Lisinopril 40 mg p.o. daily.  4. Aspirin 81 mg p.o. daily.  5. Hydralazine 100 mg p.o. t.i.d.   PHYSICAL EXAMINATION:  VITAL SIGNS:  Today, shows a blood pressure of  154/67.  His pulse is 59.  He weighs 181 pounds.  HEENT:  Normal.  NECK:  Supple with a left carotid bruit.  CHEST:  Clear.  CARDIOVASCULAR:  Regular rate and rhythm.  There is a 2/6 systolic  murmur at the left sternal border.  ABDOMEN:  No tenderness.  SKIN:  No edema.   His electrocardiogram shows a sinus rhythm at a rate of 62.  There are  occasional PVCs.  A prior anterior infarct cannot be excluded.   DIAGNOSES:  1. Hypertension - [name]'s  blood pressure is mildly elevated.  He      did bring records and it remains elevated at home.  However, he is      not taking his amlodipine.  As I have explained in previous notes,      he has difficulty with compliance, as he typically is not taking      the things that we had him on previously.  We will again resume his      Norvasc at 5 mg p.o. daily and track his blood pressure and      increase as needed.  2. Hyperlipidemia - he will continue on his Zocor and we will check      lipids and liver and adjust as indicated.  3. History of renal artery stenosis - we will continue with medical      therapy and schedule followup renal Dopplers.  4. History of small abdominal aortic aneurysm -  he will need followup      ultrasounds in the future.  5. Cerebrovascular disease - he will need followup carotid Dopplers in      January 2010.  6. History of syncope, felt secondary to atrial flutter with post      termination pauses undocumented.  7. Status post atrial flutter ablation.   I will see him back in 6 months.      Madolyn Frieze Jens Som, MD, [facility]  Electronically Signed    BSC/MedQ  DD: 09/16/2007  DT: 09/16/2007  Job #: [id number]

## 2010-07-31 NOTE — Assessment & Plan Note (Signed)
[facility]                            CARDIOLOGY OFFICE NOTE   NAME:[name], [name]                        MRN:          [id number]  DATE:04/13/2007                            DOB:          05/08/1923    [name] is a gentleman who has a history of vascular disease and  hypertension.  He has had previous stenting of his left renal artery by  Dr. Samule Ohm in October 2007 and a previous catheterization in October  2006 showed nonobstructive coronary disease.  He has normal LV function.  He also has a history of cerebrovascular disease and a small abdominal  aortic aneurysm.  Since I last saw him he denies any dyspnea, chest  pain, palpitations or syncope.  He is somewhat frustrated as he feels  that his medications are not happened his blood pressure.  He is also  concerned about the expense.   MEDICATIONS:  At present include aspirin 81 mg daily, Zocor 40 mg daily,  lisinopril 40 mg daily, Norvasc 5 mg p.o. daily, hydralazine mg p.o.  t.i.d.  Lasix 40 mg daily and Niaspan 1 gram p.o. daily.   PHYSICAL:  Today shows a blood pressure of 163/72.  His pulse is 59.  Weighs 101 pounds.  HEENT:  Normal.  NECK:  Supple.  CHEST:  Clear.  CARDIOVASCULAR:  Regular rate.  ABDOMEN:  Exam shows no tenderness.  EXTREMITIES:  Show no edema.   His electrocardiogram shows sinus rhythm at a rate of 58.  There is a  first-degree AV block.  A prior anterior infarct cannot be excluded.   DIAGNOSIS:  1. Hypertension - [name]'s blood pressure remains mildly elevated.      He did bring records today and the systolics generally in the 150-      60 range.  We have had difficulties with communication in the past.      He will send [place] records of his blood pressure that is elevated but      he will not be taking the medications that we understood him to be      on.  He now is taking the above medications and I have asked him      increase his Norvasc to 10 mg daily.  We  will follow and increase      his hydralazine as needed.  2. Hyperlipidemia - we will check lipids and liver adjust with a goal      LDL less than 70.  He will continue on his statin.  Note, he is      concerned about the expense of the Niaspan, apparently is greater      than $100 per month and he has elected to discontinue this after      his present prescription expires.  3. Stent to the left renal artery - he had follow-up Dopplers today      and we will await those results.  4. History of small abdominal aortic aneurysm - he also had a follow-      up studies of this today.  5.  Cerebrovascular disease - he need follow-up carotid Dopplers.  We      will arrange this.  6. History of syncope felt secondary to atrial flutter with post      termination pauses undocumented.  7. Status post atrial flutter ablation.  8. Tobacco abuse - now resolved.   We will see him back in 6 months.     Madolyn Frieze Jens Som, MD, [facility]  Electronically Signed    BSC/MedQ  DD: 04/13/2007  DT: 04/13/2007  Job #: [id number]

## 2010-08-03 NOTE — Cardiovascular Report (Signed)
NAMEESIAS, MORY NO.:  [id number]   MEDICAL RECORD NO.:  [id number]          PATIENT TYPE:  INP   LOCATION:  3733                         FACILITY:  [facility]   PHYSICIAN:  Vida Roller, M.D.   DATE OF BIRTH:  12-04-1923   DATE OF PROCEDURE:  01/15/2005  DATE OF DISCHARGE:                              CARDIAC CATHETERIZATION   PRIMARY CARE PHYSICIAN:  Gordy Savers, M.D.   CARDIOLOGIST:  Dr. Olga Millers   HISTORY OF PRESENT ILLNESS:  Mr. Frix is a 77 year old man who had an  episode of syncope, felt to be secondary to atrial flutter. He was also  thought to have a significant cardiomyopathy and therefore was referred for  heart catheterization to rule out significant obstructive coronary disease.   PROCEDURES PERFORMED:  1.  Left heart catheterization.  2.  Selective coronary angiography.  3.  Left ventriculography.  4.  Distal aortography.  5.  Selective renal artery angiography.  6.  Femoral arterial angiography.   PROCEDURE IN DETAIL:  After obtaining informed consent, the patient was  brought to the cardiac catheterization laboratory in fasting state. There he  was prepped and draped in the usual sterile manner and local anesthetic was  obtained over the right groin using 1% lidocaine without epinephrine.  The  right femoral artery was cannulated using the modified Seldinger technique  with a 6-French 10-cm sheath.  Left heart catheterization was performed  using a 6-French Judkins left #4, a 6-French Judkins right #4, a 6-French  non-torquing catheter and a 6-French pigtail catheter. The left main  coronary artery was cannulated using the JL-4.  The right coronary artery  was cannulated using the a non-torquing right.  The left renal artery was  cannulated using the JR-4.  The right renal artery was cannulated using the  non-torquing right catheter.  Left ventriculography was performed in a 30  degree RAO view.  At the conclusion of the  procedure the catheters were  removed. The patient was moved back to the cardiology holding area, where  the femoral artery sheath was removed. The distal aortography was also  performed with a pigtail catheter.  At the conclusion, there was no evidence  of ecchymosis or hematoma formation. Distal pulses were intact.   TOTAL FLUOROSCOPIC TIME:  11.5 minutes.   TOTAL IONIZED CONTRAST:  200 cc   RESULTS:  1.  Aortic pressure 136/70 with a mean of 103 mmHg.  2.  Left ventricular pressure 136/8 with an end-diastolic pressure of 10      mmHg.   CORONARY ANGIOGRAPHY:  1.  The left main coronary artery is a moderate caliber vessel which is free      of disease.  2.  The left anterior descending coronary artery is a large transapical      vessel with a moderate caliber branching second diagonal, a small first      diagonal.  There are luminal irregularities throughout the left anterior      descending coronary artery, with a 25% stenosis in its proximal portion.  3.  The left circumflex  coronary artery is a moderate caliber vessel, with      two large branching obtuse marginals and a posterolateral branch that      has 25% stenosis in between the first and second obtuse marginals.  4.  The right coronary artery is a large, dominant vessel with a large      posterior descending and posterolateral branch.  It has 25% stenosis in      the proximal and mid portions of the artery.   LEFT VENTRICULOGRAM:  Reveals preserved LV systolic function.  Estimated  ejection fraction 55%. No obvious wall motion abnormalities. No mitral  regurgitation.   DISTAL AORTOGRAPHY:  Reveals a small infrarenal aortic aneurysm. There is a  50% stenosis in the common femoral artery on the right.  There is severe  bilateral renal artery stenosis.  Hand injections reveal 90% on the left and  a 75% on the right in the ostium of both arteries.   ASSESSMENT:  1.  Nonobstructive coronary disease.  2.  Preserved left  ventricular systolic function.  3.  Small abdominal aortic aneurysm.  4.  Bilateral renal artery stenosis.  5.  Right femoral artery disease, nonobstructive.   PLAN:  Would recommend proceeding with evaluation of his atrial arrhythmia,  which I believe includes a transesophageal echo followed by aflutter  ablation. He will need an outpatient peripheral vascular consultation, under  the care of my colleague Dr. Geralynn Rile.      Vida Roller, M.D.  Electronically Signed     JH/MEDQ  D:  01/15/2005  T:  01/15/2005  Job:  [id number]   cc:   Gordy Savers, M.D. [facility]  [address] [place]  [place] [address]   Olga Millers, M.D. [facility]  [address]  [address]  [place]  [place] [address]

## 2010-08-03 NOTE — Assessment & Plan Note (Signed)
[facility] OFFICE NOTE   NAME:Zimmermann, TAYSHAWN PURNELL                        MRN:          [id number]  DATE:05/12/2006                            DOB:          1923/04/23    An 77 year old male seen today for followup.  He fell over the weekend,  sustaining trauma to his right chest wall.  Complains of pain with deep  inhalation and movement.  He also states that he has had a bit more  shortness of breath over the past 2 weeks.  He has hypertension status  post stenting to the renal artery and also had a history of LV  dysfunction.  Exam revealed him to be in no acute distress.  Weight was  up 8 pounds compared to a visit 4 months ago. There is no neck vein  distention.  Chest did reveal rales involving both lower lung fields.  Cardiovascular exam revealed a grade 2/6 systolic murmur, no gallop.  Abdomen benign, extremities negative.   IMPRESSION:  1. Dyspnea.  2. History of left ventricular dysfunction.  3. Possible mild congestive heart failure.  4. Hypertension, stable.  5. Chest wall pain secondary to fall.   DISPOSITION:  The patient placed on Lasix 40 mg daily.  Laboratory  studies including a BNP will be checked.  He will be rechecked in 2  weeks or sooner if his shortness of breath does not improve.     Gordy Savers, MD  Electronically Signed    PFK/MedQ  DD: 05/12/2006  DT: 05/12/2006  Job #: [id number]

## 2010-08-03 NOTE — Discharge Summary (Signed)
[name], [name] NO.:  [id number]   MEDICAL RECORD NO.:  [id number]          PATIENT TYPE:  INP   LOCATION:  3705                         FACILITY:  [facility]   PHYSICIAN:  Salvadore Farber, MD  DATE OF BIRTH:  01-04-1924   DATE OF ADMISSION:  12/17/2005  DATE OF DISCHARGE:  12/18/2005                                 DISCHARGE SUMMARY   PRINCIPAL DIAGNOSIS:  Left renal artery stenosis.   SECONDARY DIAGNOSES:  1. Uncontrolled hypertension.  2. History of prostate cancer.  3. Status post right hip fracture.  4. Status post appendectomy.  5. History of chest pain.  6. Status post cholecystectomy.  7. History of abdominal aortic aneurysm measuring 3.4 x 3.5 cm as of      11/26/2005.  8. History of atrial flutter status post ablation.  9. Bradycardia.   ALLERGIES:  No known drug allergies.   PROCEDURES:  Selective bilateral renal angiography with stenting of the left  renal artery with placement of a Herculink +6.0 x 12 mm bare metal stent.   HISTORY OF PRESENT ILLNESS:  An 77 year old male with prior history of renal  artery stenosis documented on cardiac catheterization in October of 2005.  At that time it was felt that he had moderate left renal artery stenosis;  and at least mild right renal artery stenosis.  Subsequent to his  catheterization in 2005, he has developed more difficult to control  hypertension despite 3 medications and compliance.  A repeat renal  ultrasound was performed December 09, 2005, demonstrating marked  progression of his left renal artery stenosis.  He subsequently followed up  with Dr. Samule Ohm on December 09, 2005; and decision was made to pursue  selective renal angiography.   HOSPITAL COURSE:  [name] was taken to the peripheral vascular laboratory  on October 2 and underwent selective bilateral renal angiography revealing a  70% stenosis in the proximal left renal artery and a 50% stenosis in the  proximal right renal  artery.Marland Kitchen  He then underwent successful placement of 6.0  x 12 mm Herculink plus a bare metal stent to the left renal artery.  He  tolerated this procedure well; and his renal function has remained normal  postprocedure.  He did experience some bradycardia with associated dizziness  last night prompting [place] to discontinue his clonidine prior to discharge.  He  is otherwise being discharged home today in satisfactory condition.   DISCHARGE LABS:  Hemoglobin 13.0, hematocrit 38.5 WBC 5.6, platelets 173,  MCV 89.6.  Sodium 140, potassium 4.0, chloride 107, CO2 27, BUN 7,  creatinine 0.7, glucose 122, calcium 9.1.   DISPOSITION:  The patient is being discharged home today in good condition.   FOLLOWUP PLANS AND APPOINTMENTS:  He has a follow up appointment with Dr.  Samule Ohm on January 16, 2006 at 11:30 a.m..   DISCHARGE MEDICATIONS:  1. Lisinopril 20 mg b.i.d.  2. HCTZ 12.5 mg daily.  3. Aspirin 81 mg daily.  4. Plavix 75 mg daily x30 days.  5. Hydralazine 75 mg t.i.d.  6. Zocor 4 mg daily.  7. K-Dur 10 and daily.  8. We have DISCONTINUED his Norvasc, clonidine, and Altace.   OUTSTANDING LAB STUDIES:  None.   DURATION DISCHARGE ENCOUNTER:  35 minutes including physician time.     ______________________________  [name], ANP      Salvadore Farber, MD  Electronically Signed    CB/MEDQ  D:  12/18/2005  T:  12/19/2005  Job:  [id number]

## 2010-08-03 NOTE — Consult Note (Signed)
NAMEERICKSON, YAMASHIRO NO.:  [id number]   MEDICAL RECORD NO.:  [id number]          PATIENT TYPE:  INP   LOCATION:  3733                         FACILITY:  [facility]   PHYSICIAN:  Olga Millers, M.D. LHCDATE OF BIRTH:  Feb 22, 1924   DATE OF CONSULTATION:  01/14/2005  DATE OF DISCHARGE:                                   CONSULTATION   REFERRING PHYSICIAN:  Rene Paci, M.D.   REASON FOR CONSULTATION:  Mr. Taboada is a 77 year old male with past medical  history of hypertension and prostate cancer, who we are asked to evaluate  for syncope.  He has no prior cardiac history.  He denies any history of  dyspnea on exertion, orthopnea, PND, pedal edema, palpitations, chest pain  or syncope.  Yesterday morning, he had a syncopal episode while walking to  the refrigerator.  There was no preceding chest pain, palpitations,  shortness of breath, nausea or vomiting, dysarthria, incontinence, weakness  or loss of sensation in his extremities.  The duration of his  unconsciousness was unknown.  He did have trauma to his head and to his left  upper extremity.  He was brought to the emergency room and was admitted by  primary care.  Of note, a head CT showed no bleed.  An echocardiogram was  performed today which showed an ejection fraction of 25% to 35% with  moderate mitral regurgitation.  Cardiology is now asked to evaluate.   MEDICATIONS:  His medications at present include:  1.  Protonix 40 mg p.o. daily.  2.  Aspirin 81 mg p.o. daily.  3.  Subcutaneous heparin at 5000 units subcu  3 times daily.   ALLERGIES:  He has no known drug allergies.   SOCIAL HISTORY:  He does not smoke, nor does he consume alcohol.   FAMILY HISTORY:  His family history is negative for coronary artery disease  by his report.   PAST MEDICAL HISTORY:  There is no diabetes mellitus or hyperlipidemia, but  there is hypertension.  He has a history of prostate cancer as outlined in  the HPI.   PAST SURGICAL HISTORY:  He has had a previous tonsillectomy, appendectomy  and cholecystectomy.  He has had prior right hip surgery.  He has had a cyst  removed from his lung.   REVIEW OF SYSTEMS:  He denies any headaches or fevers or chills.  There is  no productive cough or hemoptysis.  There is no dysphagia, odynophagia,  melena or hematochezia.  There is no dysuria or hematuria.  There is no rash  or seizure activity.  There is no orthopnea, PND or pedal edema.  The  remainder of the systems are negative.   PHYSICAL EXAMINATION:  VITAL SIGNS:  His physical exam shows a blood  pressure of 166/98 and his pulse is 121.  He is afebrile.  GENERAL:  He is well-developed and well-nourished, in no acute distress.  His skin is warm and dry.  He does not appear to be depressed and there is  no peripheral clubbing.  HEENT:  Remarkable for an abrasion over his  left forehead and temple from  his fall.  He eyelids are normal.  NECK:  His neck is supple with normal upstrokes bilaterally and there are no  bruits noted.  There is no jugular venous distention and I cannot appreciate  thyromegaly.  CHEST:  His chest is clear to auscultation with normal expansion.  CARDIOVASCULAR:  Exam reveals a tachycardic rate, but a regular rhythm.  There are no murmurs, rubs, or gallops noted.  EXTREMITIES:  His left upper extremity is in a cast from his trauma to his  left upper extremity.  He has a 2+ radial pulse on the right.  He has 2+  femoral pulses bilaterally and no bruits.  His extremities show no edema and  I can palpate no cords.  He has 1+ dorsalis pedis pulses bilaterally.  ABDOMEN:  His abdomen is not tender or distended.  Positive bowel sounds are  noted.  No hepatosplenomegaly and no mass appreciated.  There is no  abdominal bruit.  NEUROLOGICAL:  His neurological exam is grossly intact.   LABORATORY AND ACCESSORY CLINICAL DATA:  His electrocardiogram shows atrial  flutter with a rapid  ventricular rate of 125.  He has left ventricular  hypertrophy.  A prior septal infarct cannot be excluded.   His cardiac enzymes are negative.  His TSH is normal.  He also had a BUN and  creatinine that were normal at 11 and 0.9.  His hemoglobin and hematocrit  are 14.9 and 44.2.  His platelet count is 254,000.   DIAGNOSES:  1.  Syncope.  2.  Cardiomyopathy of uncertain etiology.  3.  Atrial flutter.  4.  Hypertension.  5.  Prostate cancer.   PLAN:  Mr. Jagiello presents with a syncopal episode of uncertain etiology.  Given his cardiomyopathy, we are certainly concerned for ventricular  arrhythmias.  The etiology of his cardiomyopathy is also unclear.  His TSH  is normal and there is no significant alcohol history.  He does have a  history of hypertension.  We will plan to proceed with cardiac  catheterization to exclude coronary disease as a cause of his  cardiomyopathy.  The risks and benefits have been discussed and the patient  agrees to proceed.  It should be noted that he is in atrial flutter with a  rapid ventricular rate and certainly his cardiomyopathy may be tachycardia-  mediated.  We will treat his cardiomyopathy with Coreg, which should also  help with the rate control.  I will also add digoxin for the same reasons.  We will also add an ACE inhibitor for his cardiomyopathy and titrate based  on blood pressure.  We will continue with his aspirin and add Lovenox  following his cardiac catheterization.  He certainly will need an EP  evaluation for possible flutter ablation.  Given his syncope and  cardiomyopathy, he may also require an ICD with cardiac resynchronization  therapy.  We will be happy to follow while he is in the hospital.           ______________________________  Olga Millers, M.D. [facility]     BC/MEDQ  D:  01/14/2005  T:  01/15/2005  Job:  [id number]

## 2010-08-03 NOTE — Progress Notes (Signed)
[facility]                          PERIPHERAL VASCULAR OFFICE NOTE   NAME:[name], [name]                        MRN:          [id number]  DATE:01/15/2006                            DOB:          July 02, 1923    HISTORY OF PRESENT ILLNESS:  [name] is an 77 year old gentleman with  renal artery stenosis, status post stenting of the left renal artery on  December 17, 2005.  Post procedural course has been uncomplicated.  At that  time, we discontinued his Norvasc, clonidine, and Altace.  Blood pressure  since that time has continued to be labile ranging from 124/59 to 174/73,  mostly it has been in the 140 to 160 systolic range.  It is not clear that  he is fully compliant with his medications.   CURRENT MEDICATIONS:  1. Aspirin 81 mg per day.  2. Zocor 40 mg per day.  3. Hydralazine 75 mg three times per day.  4. Lisinopril 20 mg per day.  5. Plavix 75 mg per day.  6. HCTZ 25 mg per day.   HABITS:  He quit smoking a number of years ago.   PHYSICAL EXAMINATION:  GENERAL:  He is generally well-appearing in no  distress.  VITAL SIGNS:  Heart rate 80, blood pressure 148/70, and weight of 175  pounds.  Weight is stable.  NECK:  He has no jugular venous distention.  No thyromegaly.  LUNGS:  Clear to auscultation.  CARDIAC:  He has a nondisplaced point of maximal cardiac impulse.  There is  a regular rate and rhythm with normal S1 and S2.  There is an S4.  A 2/6  systolic ejection murmur at the left upper sternal border with radiation to  the neck.  ABDOMEN:  Soft, nondistended, nontender.  There is no hepatosplenomegaly.  Bowel sounds are normal.  Small midline pulsatile mass without associated  bruit.  EXTREMITIES:  Warm without clubbing, cyanosis, edema, or ulceration.  VASCULAR:  Carotid pulses 2+ bilaterally with bilateral bruits versus  transmitted murmurs.  Femoral pulses 2+ bilaterally without bruit.  DP  pulses 2+ bilaterally.   IMPRESSION/RECOMMENDATION:  Continues to have labile hypertension.  It looks  like control is better than it was prior to renal revascularization,  however, it is not where it needs to be.  We will therefore increase his  Lisinopril to 20 mg twice per day.  We will leave him off the clonidine for  the time being.  We will have him follow up with Dr. Jens Som in a few weeks  for  further adjustment of his medications if necessary.  We will plan on a renal  duplex in six months and followup with me thereafter.     Salvadore Farber, MD  Electronically Signed    WED/MedQ  DD: 01/15/2006  DT: 01/15/2006  Job #: [id number]   cc:   Madolyn Frieze. Jens Som, MD, [facility]

## 2010-08-03 NOTE — Consult Note (Signed)
NAMEGRABIEL, SCHMUTZ NO.:  [id number]   MEDICAL RECORD NO.:  [id number]          PATIENT TYPE:  INP   LOCATION:  3733                         FACILITY:  [facility]   PHYSICIAN:  Doylene Canning. Ladona Ridgel, M.D.  DATE OF BIRTH:  1923/12/13   DATE OF CONSULTATION:  01/15/2005  DATE OF DISCHARGE:                                   CONSULTATION   REQUESTING PHYSICIAN:  Dr. Olga Millers   INDICATION FOR CONSULTATION:  Evaluation of atrial flutter and syncope.   HISTORY OF PRESENT ILLNESS:  The patient is a 77 year old man with a history  of hypertension, tobacco abuse, and prostate cancer.  He was in his usual  state of health when he presented to the hospital after having a sudden  syncopal episode.  This was associated with laceration to his skin on his  left head and a fractured left arm.  The patient states that he has no chest  pain or shortness of breath at baseline and has had no symptoms of  congestive heart failure.  He was in his usual state of health until Sunday  morning when he was at the refrigerator and suddenly passed out.  When he  awoke he was on the floor.  Does not know how long he was out.  He lives by  himself.  The patient denied chest pain or shortness of breath or  palpitations preceding or after the event and denied tongue biting or loss  of bowel or bladder incontinence.  The patient went to church and felt okay  but his left arm gradually began to hurt more and more and he sought medical  attention where he was found to have a fractured left arm.  He was  transferred here for additional evaluation.  Subsequent evaluation had  demonstrated atrial flutter with 2:1 ventricular response.  Echocardiogram  in the setting of atrial flutter demonstrated an EF of 25-35%.  Overall LV  size, however, was still normal.  There was diffuse hypokinesis and moderate  mitral regurgitation.   PAST SURGICAL HISTORY:  1.  Notable for tonsillectomy.  2.  Appendectomy.  3.  Cholecystectomy.  4.  Right hip fracture repair at age 36.   SOCIAL HISTORY:  He is widowed x4 years.  He lives in [place].  He is  retired from the [employer].  He has a history of tobacco use, but  stopped smoking cigarettes many years ago.   FAMILY HISTORY:  Notable for mother dying of pneumonia and father died of  heart problems at age 43.   REVIEW OF SYSTEMS:  Negative for fevers, chills, or night sweats, headache,  vision or hearing problems, photophobia, cough, or hemoptysis, skin rashes  or lesions, chest pain, shortness of breath, dyspnea, PND, or claudication.  He denies dysuria, hematuria, or nocturia.  He denies weakness, numbness, or  depression.  Denies arthralgias, arthritis, or joint deformity.  He denies  nausea, vomiting, diarrhea, constipation, polyuria, polydipsia, heat or cold  intolerance.  Rest of his review of systems was negative.   PHYSICAL EXAMINATION:  GENERAL:  He  is a pleasant, well-appearing, elderly  man in no acute distress.  VITAL SIGNS:  Blood pressure 160/90, pulse 125 and regular, respirations 18,  temperature 98.  HEENT:  Normocephalic, atraumatic.  There was a small laceration on the left  side of his scalp.  Oropharynx was moist.  The sclerae were anicteric.  NECK:  No jugular venous distention.  There is no thyromegaly.  The trachea  was midline.  CARDIOVASCULAR:  Regular tachycardia with normal S1 and S2.  His heart  sounds were somewhat distant.  The PMI was not laterally displaced nor was  it enlarged.  LUNGS:  Clear bilaterally to auscultation.  There are no wheezes, rales, or  rhonchi.  There was no increased work of breathing.  ABDOMEN:  Soft, nontender, nondistended.  There was no organomegaly.  Bowel  sounds were present.  There was no rebound or guarding.  EXTREMITIES:  No clubbing, cyanosis, edema.  The pulses were 2+ and  symmetric.  SKIN:  Normal.  NEUROLOGIC:  Alert and oriented x3 with cranial nerves intact.   The strength  was 5/5 and symmetric.  The joint examination was normal.   EKG demonstrates atrial flutter with 2:1 AV conduction.   IMPRESSION:  1.  Syncope.  2.  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  3.  Left arm fracture.  4.  Atrial flutter with a rapid ventricular response.   DISCUSSION:  I strongly suspect his LV dysfunction is secondary to atrial  flutter and a tachycardia-induced cardiomyopathy.  Syncope most likely  represents a post termination pause, though I agree that a ventricular  arrhythmia certainly is a possibility.  He has been scheduled by Dr.  Jens Som for catheterization.  I will plan to see if we can do a  transesophageal echocardiogram followed by a flutter ablation tomorrow.  The  patient presently has a soft cast for his left arm and this will need to be  transferred to a hard cast and I will defer this to his primary care team.           ______________________________  Doylene Canning. Ladona Ridgel, M.D.     GWT/MEDQ  D:  01/15/2005  T:  01/15/2005  Job:  [id number]

## 2010-08-03 NOTE — Discharge Summary (Signed)
[name], [name] NO.:  [id number]   MEDICAL RECORD NO.:  [id number]          PATIENT TYPE:  INP   LOCATION:  3733                         FACILITY:  [facility]   PHYSICIAN:  Rene Paci, M.D. LHCDATE OF BIRTH:  08-14-1923   DATE OF ADMISSION:  01/13/2005  DATE OF DISCHARGE:  01/18/2005                                 DISCHARGE SUMMARY   DISCHARGE DIAGNOSES:  1.  Syncope secondary to atrial flutter.  2.  Atrial flutter with rapid ventricular response.  3.  Tachycardia-induced cardiomyopathy.  4.  Left radial head fracture.  5.  Bilateral renal artery stenosis.   HISTORY OF PRESENT ILLNESS:  The patient is a 77 year old white male who was  admitted on January 13, 2005 after falling at home after syncopal event  which lasted several minutes.  Patient was admitted for further work-up and  evaluation.   PAST MEDICAL HISTORY:  1.  Hypertension.  2.  Tobacco abuse.  3.  Prostate cancer.   HOSPITAL COURSE:  #1 - SYNCOPE SECONDARY TO ATRIAL FLUTTER:  Patient was  admitted and a CT head was performed which showed no acute disease, but did  note chronic microvascular white matter disease.  In addition, a 2-D  echocardiogram was performed which noted a decreased left ventricular  function of 25-35% as well as moderate diffuse left ventricular hypokinesis.  Cardiac enzymes were performed which noted a mildly elevated CK-MB.  A  cardiology consult was obtained.  Patient was noted to be in atrial flutter  and was seen by Dr. Olga Millers of [facility].   #2 - ATRIAL FLUTTER:  Patient underwent a cardiac catheterization on January 15, 2005 which noted non-obstructive coronary artery disease, small  abdominal aortic aneurysm, and bilateral renal artery stenosis.  In  addition, right femoral arterial disease was noted.  Cardiology then  performed a transesophageal echocardiogram with ablation which was performed  on January 16, 2005 by Dr. Gilman Schmidt.   Patient was maintained on IV  heparin and Coumadin post procedure and has maintained sinus rhythm.  Patient was noted to have an episode of bradycardia with heart rate down  into the high 30s after Coreg was increased to 6.25.  As a result, patient  will be discharged on 3.125 mg p.o. b.i.d. with outpatient follow-up with  cardiology.   #3 - LEFT RADIAL HEAD FRACTURE:  Patient was noted on x-ray to have a left  radial head fracture.  An orthopedic evaluation was obtained and patient was  seen by Dr. Ranell Patrick.  Hard cast was placed.  Patient is to follow up with Dr.  Ranell Patrick in two weeks for follow-up appointment.   #4 - BILATERAL RENAL ARTERY STENOSIS:  Patient is scheduled to see Dr.  Randa Evens for further evaluation and also to undergo an ultrasound of  the abdominal aorta as an outpatient to further evaluate abdominal aortic  aneurysm.   DISCHARGE MEDICATIONS:  1.  Protonix 40 mg p.o. daily.  2.  Aspirin 81 mg p.o. daily.  3.  Altace 2.5 mg p.o. b.i.d.  4.  Coumadin 5  mg two daily.  5.  Coreg 3.125 mg p.o. b.i.d.   Prescriptions were provided for 30 tablets of Altace and Coumadin and 60  tablets of Coreg.   DISCHARGE LABORATORIES:  BUN 13, creatinine 0.7, hemoglobin 11.5, hematocrit  33.7.  INR 2.1.   FOLLOW-UP:  Patient is to follow up with Dr. Amador Cunas in two weeks or as  needed and was instructed to call for an appointment.  Patient is to follow  up with Dr. Randa Evens on December 18 at 10:30 a.m.  Follow up with Dr.  Ladona Ridgel on November 28 at 11:30 a.m.  Follow up in the [facility] on  Monday, November 6 at 4:15 p.m.  Follow up with Dr. Ranell Patrick in two weeks,  call for appointment.  Follow up with Dr. Jens Som on November 9 at 4 p.m.      Martin S. [name], NP      Rene Paci, M.D. [facility]  Electronically Signed    MSO/MEDQ  D:  01/18/2005  T:  01/18/2005  Job:  [id number]   cc:   Gordy Savers, M.D. [facility]  [address] [place]  [place] [address]   Salvadore Farber, M.D. [facility]  [address]  [address]  [place]  [place] [address]   Doylene Canning. Ladona Ridgel, M.D.  [address]  [address]  [place]  [place] [address]   Almedia Balls. Ranell Patrick, M.D.  Fax: [phone]   Olga Millers, M.D. [facility]  [address]  [address]  [place]  [place] [address]

## 2010-08-03 NOTE — H&P (Signed)
NAMEROBYN, NOHR NO.:  [id number]   MEDICAL RECORD NO.:  [id number]          PATIENT TYPE:  EMS   LOCATION:  URG                          FACILITY:  [facility]   PHYSICIAN:  Sean A. Everardo All, M.D. [facility] OF BIRTH:  01/24/1924   DATE OF ADMISSION:  01/13/2005  DATE OF DISCHARGE:                                HISTORY & PHYSICAL   REASON FOR ADMISSION:  Syncope.   HISTORY OF PRESENT ILLNESS:  This 77 year old man about 9 o'clock this  morning was at home reaching for the refrigerator when he had episode of  syncope that was not witnessed.  This apparently lasted a few minutes, but  this cannot be determined with certainty.  He had no premonitory symptoms,  but woke up with a slight headache on the left side and pain in his left  wrist.   PAST MEDICAL HISTORY:  1.  Hypertension.  2.  Cigarette smoking.  3.  Prostate cancer.   MEDICATIONS:  Blood pressure medication.   SOCIAL HISTORY:  He is retired.  His daughter, named Jeanice Lim, is here.   FAMILY HISTORY:  Negative for syncope.   REVIEW OF SYSTEMS:  Denies the following, chest pain, shortness of breath,  nausea, vomiting, hematuria, rectal bleeding, visual loss, dysuria, seizure,  skin rash, and incontinence.   PHYSICAL EXAMINATION:  VITAL SIGNS: Blood pressure 139/87, heart rate 120,  respiratory rate 16, temperature 98.3.  GENERAL:  No distress.  Still not diaphoretic.  HEENT:  There is a slight contusion at the left supraorbital ridge.  The  eyes and periorbital areas themselves are normal. Pharynx: No erythema.  NECK: Supple.  CHEST: Clear to auscultation.  CARDIOVASCULAR: No JVD, no edema. Regular rate and rhythm. Normal. Pedal  pulses are intact.  ABDOMEN: Soft, nontender, no hepatosplenomegaly, no mass.  GENITAL/RECTAL: Examination cannot be done at this time due to patient's  condition.  EXTREMITIES: No deformity is seen.  He has a sling and splint on his left  forearm.   LABORATORY  STUDIES:  Electrocardiogram shows sinus tachycardia and left  ventricular hypertrophy.   Laboratory studies are remarkable for WBC 12,800.  Alkaline phosphatase 145.   X-rays show left wrist fracture.   IMPRESSION:  1.  Syncope episode of uncertain etiology.  2.  Tachycardia of uncertain etiology.  3.  Hypertension by history, and it is not know what medication he takes.  4.  Cigarette smoker.  5.  Fracture of the left wrist.  6.  History of prostate cancer.   PLAN:  1.  Check CPKs, TSH, troponin, CT of the head, PSA, and echocardiogram.  2.  Admit to telemetry.  3.  Will find out what blood pressure medication the patient is on.  4.  Elective workup of these mild laboratory abnormalities as noted above.  5.  I discussed code status with the patient and his daughter.  He states he      wants to be full code, but he states there will be a strict 2-day limit      on any artificial life support measures that  would be instituted.           ______________________________  Cleophas Dunker. Everardo All, M.D. [facility]     SAE/MEDQ  D:  01/13/2005  T:  01/13/2005  Job:  [id number]   cc:   Gordy Savers, M.D. [facility]  [address] [place]  [place] [address]

## 2010-08-03 NOTE — H&P (Signed)
[place]  Patient:    [name], [name] Visit Number: [id number] MRN: [id number]          Service Type: DSU Location: [facility] [id number] Attending Physician:  Ivor Messier Dictated by:   Guadelupe Sabin, M.D. Adm. Date:  11/07/2000 Disc. Date: 11/07/2000                           History and Physical  REASON FOR ADMISSION:  This was a planned outpatient surgical admission of this 77 year old white male admitted for cataract implant surgery of the left eye.  PRESENT ILLNESS:  This patient has been followed in my office since January 11, 1999 when he was referred for evaluation of blurred vision, macular degeneration, and cataract formation.  Initial examination revealed a visual acuity with refraction of 20/30 right eye, 20/25 left eye.  Nuclear cataract formation was noted in both eyes.  Detailed fundus examination showed mild, age-related, dry macular degeneration with slight retinal pigment epithelial layer defects.  The patient was told to discontinue smoking, begin Ocuvite multivitamin preparation.  Following this, the patient returned to the office and when last seen in the office it was felt that cataract implant surgery was indicated, as vision had deteriorated to 20/100 in the left eye.  The patient signed an informed consent and arrangements were made for his outpatient admission at this time.  PAST MEDICAL HISTORY:  Patient is in stable general health under Dr. Amador Cunas.   His current medications include hydrochlorothiazide and Zestril.  He has no known allergies and is felt to be an average risk for the proposed surgery.  PHYSICAL EXAMINATION:  VITAL SIGNS:  As recorded on admission, blood pressure 178/79, temperature 97.2, pulse 46, respirations 18.  GENERAL APPEARANCE:  Patient is a thin, aged, alert white male in no acute distress.  HEENT:  Eyes:  External ocular and slit lamp examination reveals bilateral nuclear  cataract formation.  Detailed fundus examination:  Vitreous clear, retina attached, age-related macular degeneration - mild dry type.  CHEST:  Lungs clear to percussion and auscultation.  HEART:  Normal sinus rhythm, no cardiomegaly, no murmurs.  ABDOMEN:  Negative.  EXTREMITIES:  Negative.  ADMISSION DIAGNOSIS:  Senile cataracts, both eyes; early age-related macular degeneration, both eyes.  SURGICAL PLAN:  Cataract implant surgery - left eye now, right eye later. Dictated by:   Guadelupe Sabin, M.D. Attending Physician:  Ivor Messier DD:  11/07/00 TD:  11/08/00 Job: [id number]

## 2010-08-03 NOTE — Op Note (Signed)
[facility]  Patient:    [name], [name] Visit Number: [id number] MRN: [id number]          Service Type: DSU Location: [facility] 2899 22 Attending Physician:  Ivor Messier Dictated by:   Guadelupe Sabin, M.D. Proc. Date: 11/07/00 Adm. Date:  11/07/2000 Disc. Date: 11/07/2000                             Operative Report  PREOPERATIVE DIAGNOSIS:  Senile cataract, left eye.  POSTOPERATIVE DIAGNOSIS:  Senile cataract, left eye.  NAME OF OPERATION:  Planned extracapsular cataract extraction - phacoemulsification, primary insertion of posterior chamber intraocular lens implant.  SURGEON:  Guadelupe Sabin, M.D.  ASSISTANT:  Nurse.  ANESTHESIA:  Local 4% Xylocaine, 0.75% Marcaine retrobulbar block, topical tetracaine, intraocular Xylocaine.  Anesthesia standby required in this elderly patient.  Patient given sodium pentothal intravenously during the period of retrobulbar injection.  OPERATIVE PROCEDURE:  After the patient was prepped and draped a lid speculum was inserted in the left eye.  Schiotz tonometry was recorded at 7 scale units with a 5.5 g weight.  A peritomy was performed adjacent to the limbus from the 11 to 1 oclock position.  The corneoscleral junction was cleaned and a corneoscleral groove made with a 45 degree Superblade.  The anterior chamber was then entered with the 2.5 mm Diamond keratome at the 12 oclock position, and the 15 degree blade at the 2:30 position.  Using a bent 26 gauge needle on a Healon syringe, a circular capsulorrhexis was begun and then completed with the Grabow forceps.  Hydrodissection and hydrodelineation were performed using 1% Xylocaine.  The 30 degree phacoemulsification tip was then inserted and slow emulsification of the lens nucleus accomplished.  Total ultrasonic time - one minute 12 seconds; average power level - 18%; total amount of fluid used - 75 cc.  Following removal of the nucleus,  the residual cortex was aspirated with irrigation aspiration tip.  The posterior capsule appeared intact with brilliant red fundus reflex.  It was therefore elected to insert an [employer] [id number] silicon three-piece posterior chamber intraocular lens implant with UV absorber, diopter strength +19.00.  This was inserted with the McDonald forceps into the anterior chamber and then centered into the capsular bag using the [facility] lens rotator.  The lens appeared to be well centered. The Healon which had been used during the procedure was then aspirated and replaced with balanced salt solution and Miochol ophthalmic solution.  The operative incisions appeared to be self-sealing and no sutures were required. A light patch and protector shield applied.  Duration of procedure and anesthesia administration - 45 minutes.  Patient tolerated the procedure well in general, left the operating room for the recovery room in good condition. Dictated by:   Guadelupe Sabin, M.D. Attending Physician:  Ivor Messier DD:  11/07/00 TD:  11/08/00 Job: [id number]

## 2010-08-03 NOTE — Assessment & Plan Note (Signed)
[facility]                            CARDIOLOGY OFFICE NOTE   NAME:Weigold, GUNNER IODICE                        MRN:          [id number]  DATE:02/12/2006                            DOB:          11-Aug-1923    Mr. Vantol is a gentleman who I follow for vascular disease and  hypertension.  Since I last saw him, there is no dyspnea, chest pain,  palpitations or syncope.  Note he did undergo stenting of his left renal  artery on December 17, 1005, by Dr. Samule Ohm.   His present medications include:   1. Aspirin 81 mg p.o. daily.  2. Zocor 40 mg p.o. q.h.s.  3. Hydralazine 75 mg p.o. t.i.d.  4. Lisinopril 20 mg p.o. b.i.d.  5. Hydrochlorothiazide 12.5 mg p.o. daily.   PHYSICAL EXAMINATION:  His physical exam today shows a blood pressure of  140/40, his pulse is 62, weight is 185 pounds.  His neck is supple.  His  chest is clear.  His cardiovascular exam reveals a regular rate and  rhythm.  His extremities show no edema.   Electrocardiogram shows a sinus rhythm at a rate of 62.  A prior septal  infarct cannot be excluded.  There is left ventricular hypertrophy.   DIAGNOSES:  1. History of syncopal episode, felt secondary to atrial flutter with      possible post-termination pauses, undocumented.  2. Status post atrial flutter ablation.  3. History of cardiomyopathy, improved on most recent echocardiogram.  4. Status post stenting of the left renal artery for renal artery      stenosis.  5. Abdominal aortic aneurysm.  6. Hypertension.  7. Cerebrovascular disease.  8. History of prostate cancer.  9. History of left radial head fracture with mildly elevated alkaline      phosphatases.   PLAN:  Mr. Veney is doing well from symptomatic standpoint.  I have  reviewed his blood pressures from home and his systolic blood pressure  appears to be running in the 140-160 range.  We will add Norvasc 5 mg  p.o. daily, which should control his pressure.  He will  continue with  risk factor modification.  He will need followup carotid Dopplers in  January 2009, an  abdominal ultrasound to follow up with his aneurysm in September of  2008.  He has discontinued his tobacco use.  I will see him back in six  months.     Madolyn Frieze Jens Som, MD, [facility]  Electronically Signed    BSC/MedQ  DD: 02/12/2006  DT: 02/12/2006  Job #: [id number]

## 2010-08-03 NOTE — Assessment & Plan Note (Signed)
[facility]                              CARDIOLOGY OFFICE NOTE   NAME:NOLENCreston, Klas                   MRN:          [id number]  DATE:10/30/2005                            DOB:          1924/02/08    Mr. Yo returns for followup today.  Since I last saw him, he denies any  dyspnea, chest pain, presyncope or syncope.  There is no pedal edema.  However, over the past three months, he describes an unsteadiness with  ambulation.  He feels like he is drunk.  He denies any loss of strength or  sensation in his extremities, nor is there dysarthria.  He apparently has  been seen by Dr. Amador Cunas and an MRI suggested a possible CVA, although I  do not have those records available.  He has also been treated with an  antibiotic for a possible inner ear infection.  However, his symptoms have  persisted.  His medications have included aspirin 81 mg p.o. daily, Lasix 20  mg p.o. daily, potassium 10 mEq p.o. daily, Zocor 40 mg p.o. q.h.s., Altace  10 mg p.o. b.i.d., Hydralazine 75 mg p.o. t.i.d. and Norvasc 10 mg p.o.  daily.   PHYSICAL EXAMINATION:  VITAL SIGNS:  Shows a blood pressure of 171/69.  His  pulse is 57.  He weighs 172 pounds.  NECK:  Supple.  CHEST:  Clear.  CARDIOVASCULAR:  Regular rate and rhythm.  EXTREMITIES: No edema.   His electrocardiogram shows a sinus bradycardia rate of 53.  There is a  prior septal infarct.  It is unchanged from previous.   DIAGNOSES:  1. History of syncopal episode felt secondary to atrial flutter with      possible post-termination pause undocumented.  2. Status post atrial flutter ablation.  History of cardiomyopathy,      improved by most recent echocardiogram.  3. History of bilateral renal artery stenosis.  4. History of abdominal aortic aneurysm.  5. History of left radial head fracture with mildly elevated alkaline      phosphatase.  6. Hypertension.  7. Tobacco abuse.  8. History of prostate  cancer.   PLAN:  Mr. Seltzer continues to have elevated blood pressures.  They had been  controlled previously.  I will add Clonidine 0.1 mg p.o. b.i.d. for improved  control and this can be titrated as needed.  He has been bradycardic on beta  blockade in the past.  I will proceed with a followup ultrasound of his  abdomen, both to re-size his abdominal aortic aneurysm, and also to reassess  his renal artery stenosis.  He may require intervention to his renal  arteries in the future if his blood pressure becomes difficult to control or  his renal function deteriorates.  He will need followup carotid Dopplers in  January 2009.  Will check a CMET today to follow his potassium and renal  function, as well as his liver functions, and we will also plan to repeat  his lipid panel with a goal LDL less than 70.  I again discussed the  importance of discontinuing his tobacco use.  He  is complaining of an  unsteady gait and there is a question of a stroke by prior MRI.  He has been  seen by Dr. Amador Cunas for this issue and I have asked him to follow up  with Dr. Amador Cunas and he may need a referral to Neurology.  We will see  him back in approximately 8 weeks to review his blood pressure and adjust  his medication.                              Madolyn Frieze Jens Som, MD, [facility]    BSC/MedQ  DD:  10/30/2005  DT:  10/30/2005  Job #:  [id number]   cc:   Gordy Savers, MD

## 2010-08-03 NOTE — Assessment & Plan Note (Signed)
[facility]                              CARDIOLOGY OFFICE NOTE   NAME:Panther, EVEN BUDLONG                        MRN:          [id number]  DATE:12/13/2005                            DOB:          1923-09-16    Mr. Umanzor returns for followup today. Since I last saw him he has not chest  pain, shortness of breath or pedal edema. He did have followup abdominal  ultrasound performed on November 26, 2005. He was found to have increased  left renal artery stenosis. His abdominal aortic aneurysm was unchanged in  size, it measured 3.4x3.5 centimeters.   CURRENT MEDICATIONS:  1. Aspirin 81 mg 2 daily.  2. Potassium 10 mEq daily.  3. Zocor 40 mg p.o. at bedtime.  4. Altace 10 mg b.i.d.  5. Hydralazine 75 mg t.i.d.  6. Clonidine 0.3 mg b.i.d.   PHYSICAL EXAMINATION:  VITAL SIGNS: Blood pressure 154/68, pulse 76.  NECK: Supple.  CHEST: Clear.  CARDIOVASCULAR: Regular.  EXTREMITIES: No edema.   DIAGNOSIS:  1. History of syncopal episode, felt secondary to atrial flutter with      possible post-termination pause undocumented.  2. Status post atrial flutter ablation.  3. History of cardiomyopathy improved by most recent echocardiogram.  4. Bilateral renal artery stenosis.  5. Abdominal aortic aneurysm.  6. Hypertension.  7. Tobacco abuse.  8. Cerebral vascular disease.  9. History of prostate cancer.  10.History of left radial head fracture with mildly elevated alkaline      phosphates.   PLAN:  Mr. Locy is doing well from a symptomatic standpoint. We will  continue his present medications, but he is not taking his Norvasc which is  on my previous notes, nor is he taking Lasix. We will discontinue his  potassium. He has had bradycardia with beta-blocker aid in past. He is  scheduled to see Dr. Samule Ohm for arteriogram as his renal artery stenosis has  progressed and he is on several medications to control his blood pressure,  and we were having  difficulty controlling that. He will need followup  carotid Doppler's in January 2009 and a follow up abdominal ultrasound to  size his aneurysm in September 2008. We again discussed importance of  discontinuing his tobacco use. Note I will not adjust his blood pressure  medications as he is  scheduled arteriogram next week with possible PTA of his renal artery. He  will follow up with Dr. Amador Cunas concerning his unsteadiness. He will see  [place] back in six months.            ______________________________  Madolyn Frieze. Jens Som, MD, [facility]     BSC/MedQ  DD:  12/13/2005  DT:  12/15/2005  Job #:  [id number]   cc:   Gordy Savers, MD

## 2010-08-03 NOTE — Op Note (Signed)
NAMEBLAISE, GRIESHABER NO.:  [id number]   MEDICAL RECORD NO.:  [id number]          PATIENT TYPE:  INP   LOCATION:  3705                         FACILITY:  [facility]   PHYSICIAN:  Salvadore Farber, MD  DATE OF BIRTH:  08-Jan-1924   DATE OF PROCEDURE:  12/17/2005  DATE OF DISCHARGE:                                 OPERATIVE REPORT   SKIP:   PROCEDURE:  Selective bilateral renal angiography, stenting of the left  renal artery, Angio-Seal closure of the right common femoral arteriotomy  site.   INDICATIONS FOR PROCEDURE:  Mr. Dettmann is an 77 year old gentleman who had  moderate left and at least mild right renal artery stenosis diagnosed of the  time of catheterization in October 2005.  Over the subsequent two years he  has had more difficult to control hypertension requiring an escalation in  his medication regimen.  His renal function has remained normal.  Repeat  duplex examination in early September demonstrated a marked progression of  his left renal artery stenosis such that the peak systolic velocity was now  594 cm/sec with a renal aortic ratio of 5.3.  The kidney remained normal in  size at 13.3 cm.  Right renal artery appeared to have a moderate stenosis at  its origin.  He presents today for angiography and possible renal artery  stenting.   PROCEDURE TECHNIQUE:  Informed consent was obtained.  Under 1% lidocaine  local anesthesia, a 5-French sheath was placed in the right common femoral  artery using modified Seldinger technique.  A 5-French pigtail catheter was  advanced to the suprarenal abdominal aorta.  Abdominal aortography was  performed by power injection.  This confirmed a severe stenosis at the  origin of the left renal artery.  The right renal artery was not well  visualized.   Anticoagulation was initiated with heparin to achieve and maintain an ACT of  greater than 250 seconds.  The patient had been preloaded on aspirin and  Plavix.  A  5-French SOS catheter was used to selectively engage the right  renal artery.  Angiography was performed by power injection.  The catheter  was then advanced across the lesion and pullback performed.  This  demonstrated less than 15 mmHg gradient as assessed with a 5-French  catheter.   Attention was then turned to the left renal artery.  The SOS catheter would  not selectively engage this.  I was, however, able to manipulate a  stabilizer wire into it from above.  I then telescoped a 6-French LIMA  guiding catheter over the diagnostic catheter and removed the diagnostic  catheter.  Angiography was performed by power injection.  I then dilated the  70% stenosis at the ostium using a 5 x 15 mm Aviator balloon at 6  atmospheres.  I then stented the lesion using a 6 x 12-mm Herculink deployed  at 14 atmospheres.  I then post dilated the stent using a 7 x 15 mm Via-  Track balloon at 16 atmospheres.  Final angiography demonstrated  approximately 10% residual stenosis, no dissection, and normal flow  to the  renal parenchyma.  Catheters were then removed over a wire.   The arteriotomy was then closed using the Angio-Seal device.  Complete  hemostasis was obtained.  He was then transferred to holding room in stable  condition having tolerated the procedure well.   COMPLICATIONS:  None.   IMPRESSION/RECOMMENDATIONS:  Successful revascularization of the severe  stenosis of the left renal artery.  The right renal artery is normal and  will be managed conservatively.  He should be maintained on Plavix for 30  days and aspirin indefinitely.      Salvadore Farber, MD  Electronically Signed     WED/MEDQ  D:  12/17/2005  T:  12/18/2005  Job:  [id number]   cc:   Madolyn Frieze. Jens Som, MD, [facility]  Gordy Savers, MD

## 2010-08-03 NOTE — Progress Notes (Signed)
[place] [facility]                          PERIPHERAL VASCULAR OFFICE NOTE   NAME:[name], [name]                   MRN:          [id number]  DATE:12/09/2005                            DOB:          10-07-1923    HISTORY OF PRESENT ILLNESS:  Mr. Frediani is an 77 year old gentleman with  renal artery stenosis as documented at cardiac catheterization in October  2005.  At that time, it was moderate on the left and at least mild on the  right.  He has continued to have difficult to control hypertension which has  been, if anything, more difficult to control over the past several months.  He is currently compliant with substantial doses of three medications  without control.  Renal function remains normal with a creatinine of 0.7 as  of August.  Repeat duplex examination performed earlier this month  demonstrated marked progression of his left renal artery stenosis such that  his peak systolic velocity is now 594 cm per second with a renal aortic  ratio of 5.3.  The left kidney remains normal in size at 13.3 cm.   CURRENT MEDICATIONS:  1. Aspirin 81 mg per day.  2. Potassium chloride 10 mEq per day.  3. Zocor 40 mg per day.  4. Altace 10 mg twice per day.  5. Hydralazine 25 mg 3 times per day.  6. Clonidine 0.3 mg twice per day.   PAST MEDICAL HISTORY:  1. Hypertension.  2. Prostate cancer.  3. Right hip fracture.  4. Status post appendectomy.  5. Chest pain cholecystectomy.  6. Abdominal aortic aneurysm measuring 3.4 x 3.5 cm as of November 26, 2005.  7. Atrial flutter, status post ablation.  8. History of Coumadin felt to be due to tachycardia, now improved.   ALLERGIES:  None known.   SOCIAL HISTORY:  The patient is widowed for 4 years.  Great disgust of the  medical system related to events at the time of his wife's death.  Previously worked in [employer].  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  Quit smoking a  number of years ago.  Denies alcohol  and illicit drug use.   FAMILY HISTORY:  Mother died of pneumonia.  Father died of cardiac disease  at age 26.  His daughter is alive and well.   REVIEW OF SYSTEMS:  Complains of dizziness episodically.  This is under  evaluation by Dr. Jens Som and Dr. Amador Cunas.  Review of systems is  otherwise negative in detail except as above.   PHYSICAL EXAMINATION:  GENERAL:  He is generally well appearing and in no  distress.  VITAL SIGNS:  Heart rate 74, blood pressure 142/80 on the right and 138/82  on the left.  Blood pressures at home have ranged from 100/50 to 201/81 with  the majority of systolics being in the 170 range.  NECK:  He has no jugular venous distension and no thyromegaly.  HEENT:  Normal.  SKIN EXAM:  Normal.  LYMPHATICS:  There is no lymphadenopathy.  LUNGS:  Respiratory effort is normal.  Lungs are clear  to auscultation.  CARDIAC:  He has a regular rate and rhythm with a 2/6 systolic ejection  murmur at the left upper sternal border with radiation to the neck.  ABDOMEN:  Soft, nondistended, nontender.  There is no hepatosplenomegaly.  Small midline pulsatile mass without associated bruit.  EXTREMITIES:  Warm without clubbing, cyanosis, edema or ulceration.  Carotid  pulses 2+ bilaterally with bilaterally with bilateral bruits versus  transmitted murmur.  Femoral pulses 2+ bilaterally without bruit.  DP pulses  2+ bilaterally.   IMPRESSION/RECOMMENDATIONS:  Patient with extremely labile hypertension  despite compliance with substantial doses of three medications.  His renal  function is normal.  However, he has had a marked progression in the  severity of his left renal artery stenosis.  I have recommended proceeding  with angiography with an eye to percutaneous revascularization with a goal  of preservation of renal function on the left and affording the possibility  of some improvement in control of his hypertension.  Risks were  explained in  detail to Mr. Panico and his grandson.  He agrees to proceed.  We will plan  on initiating Plavix while he is in the hospital.                                   Salvadore Farber, MD   WED/MedQ  DD:  12/09/2005  DT:  12/11/2005  Job #:  [id number]

## 2010-08-03 NOTE — Discharge Summary (Signed)
[name], [name] NO.:  [id number]   MEDICAL RECORD NO.:  [id number]          PATIENT TYPE:  INP   LOCATION:  3733                         FACILITY:  [facility]   PHYSICIAN:  Rene Paci, M.D. LHCDATE OF BIRTH:  1923-04-26   DATE OF ADMISSION:  01/13/2005  DATE OF DISCHARGE:                                 DISCHARGE SUMMARY   ADDENDUM:  The patient also with complaints of chronic sebaceous cyst on his  back with intermittent drainage.  This will need further evaluation as an  outpatient and possible surgical evaluation.      [name] [name], NP      Rene Paci, M.D. [facility]  Electronically Signed    MSO/MEDQ  D:  01/18/2005  T:  01/18/2005  Job:  [id number]   cc:   Gordy Savers, M.D. [facility]  [address] [place]  [place] [address]

## 2010-08-03 NOTE — Assessment & Plan Note (Signed)
[facility] OFFICE NOTE   NAME:Culliton, RYNELL CIOTTI                   MRN:          [id number]  DATE:10/31/2005                            DOB:          October 27, 1923    An 77 year old gentleman who is seen today complaining of ongoing gait  abnormality.  He was seen yesterday by cardiology, and an additional blood  pressure medicine was added to his three drug regimen.  He continues to have  problems with his gait.  He remains unsteady with walking.  He denies any  real orthostatic symptoms.  Complaints seem to be most marked when he is  walking and then has periods of instability and fear of falling.  This has  affected his lifestyle but apparently has had no significant falls or  trauma.  Due to these complaints, he did have a brain MRI on July 07, 2005.  This revealed chronic microvascular changes involving the hemispheric white  matter but no evidence of acute process; however, there was some fluid in  the right mastoid region.   The patient denies any local mastoid pain, fever.   PHYSICAL EXAMINATION:  VITAL SIGNS:  Examination today revealed blood  pressure 140/70 on arrival.  It was 160/80 by my exam.  NEURO:  The patient is alert and appropriate.  There is no drift to the  outstretched arms.  There did not appear to be any cogwheel rigidity.  __________ testing was performed well as was rapid alternating movement.  He  did walk with a slightly unsteady gait.  He was somewhat slow and  deliberate.  There was diminished arm swaying with walking and turning to  walk back 180 degrees was also very slow.  He was able to walk on his toes  and heels with some difficulty.  He was not able to do a tandem walk.   IMPRESSION:  Gait abnormality, abnormal MRI, rule out parkinsonian syndrome  with gait abnormality and instability.   DISPOSITION:  Options were discussed.  He has been referred twice for  vestibular  training but has not followed through on these appointments.  He  is interested in pursuing a neurological evaluation.  It is unclear whether  the MRI findings are consistent with normal age-related changes or perhaps  multi-infarct disease of the small vessel variety has led to his gait  abnormality.  It is also unclear whether the abnormality in the right  mastoid area is etiologic.  I feel this is doubtful.  There are some  features to suggest parkinsonism, and we will obtain a neurology evaluation.  It is still likely the patient might benefit from vestibular training, and  perhaps this will be followed up in the future.  If the issue of the fluid  in the mastoid region remains unclear, may follow through also with an ENT  consult.  He was placed on an unknown additional blood pressure medication  yesterday.  He was encouraged to get this filled and follow  through.  Blood pressure today was slightly elevated without orthostatic  changes.  We will reassess on  his usual appointment in two months time.                                   Gordy Savers, MD   PFK/MedQ  DD:  10/31/2005  DT:  10/31/2005  Job #:  [id number]

## 2010-08-03 NOTE — Op Note (Signed)
NAMEBERLIN, MOKRY NO.:  [id number]   MEDICAL RECORD NO.:  [id number]          PATIENT TYPE:  INP   LOCATION:  3733                         FACILITY:  [facility]   PHYSICIAN:  Doylene Canning. Ladona Ridgel, M.D.  DATE OF BIRTH:  02/21/1924   DATE OF PROCEDURE:  01/16/2005  DATE OF DISCHARGE:                                 OPERATIVE REPORT   PROCEDURE PERFORMED:  Electrophysiologic study and radiofrequency catheter  ablation of atrial flutter.   ATTENDING:  Doylene Canning. Ladona Ridgel, M.D.   I. INTRODUCTION:  The patient is a 77 year old man with a history of atrial  flutter initially diagnosed on presentation after he had a syncopal episode  and broke his arm.  He had no awareness that he was out of rhythm and denies  shortness of breath.  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  He is now referred for electrophysiologic  study and catheter ablation.  Of note, the patient had undergone  transesophageal echo which demonstrated no clot in the left atrial  appendage.   II. PROCEDURE:  After informed consent was obtained, the patient was taken  to the diagnostic EP lab in a fasting state.  After the usual preparation  and draping, intravenous fentanyl and midazolam were given for sedation.  A  6-French Hexapolar catheter was inserted percutaneously into the right  jugular vein and advanced to the coronary sinus.  A 5-French quadripolar  catheter was inserted percutaneously in the right femoral vein and advanced  the His bundle region.  A 7-French 20-pole halo catheter was inserted  percutaneously into the right femoral vein and advanced to the right atrium.  Mapping was then carried out.  This demonstrated a clockwise typical atrial  flutter with a cycle length of 250 milliseconds.  Mapping was carried out in  the atrial flutter isthmus.  There appeared be a clockwise  atrial flutter in  the tricuspid valve region.  Six RF energy applications were subsequently  delivered to this region.  Previous mapping had demonstrated a very large  atrial flutter isthmus.  During the sixth RF energy application, atrial  flutter was terminated and sinus rhythm was restored.  Three bonus RF energy  applications were delivered as the patient initially had isthmus block  bidirectionally.  Following 20 minutes and 3 bonus RF energy applications,  the atrial flutter isthmus block persisted and pacing was then carried out.  Next, rapid ventricular pacing was carried out from the RV apex at a pacing  cycle length of 600 milliseconds and stepwise decreased down to 450  milliseconds, where VA Wenckebach was observed.  During rapid ventricular  pacing, the atrial activation sequence was midline and decremental.  Next,  programmed ventricular stimulation was carried out from the RV apex at basic  drive cycle length of 161 milliseconds.  The S1/S2 interval was stepwise  decreased from 540 milliseconds down to 380 milliseconds,  where the  retrograde AV node ERP was observed.  During programmed ventricular  stimulation, the atrial activation  was midline and decremental.  Next, rapid  atrial pacing was carried out from the coronary sinus as well as the high  right atrium  at pacing cycle lengths of 600 milliseconds and stepwise  decreased down to 390 milliseconds, where A-V Wenckebach was observed.  During rapid atrial pacing, the P-R interval was less than the R-R interval,  and there was no inducible SVT.  Next, programmed atrial stimulation was  carried out from the high right atrium at basic drive cycle lengths of 045  milliseconds.  The S1-S2 interval was stepwise decreased down from 540  milliseconds down to 280 milliseconds where the AV node ERP was observed.  During programmed atrial stimulation, there were no A-H jumps and no echo  beats.  Following this and after 20 minutes  of observation demonstrating no  atrial flutter isthmus conduction, the catheters were removed, hemostasis  was assured, and the patient was returned to his room in satisfactory  condition.   III. COMPLICATIONS:  There were no immediate procedure complications.   IV. RESULTS:  A.  Baseline ECG:  Baseline ECG demonstrates atrial flutter  with 2:1 AV conduction.  B.  Baseline intervals:  The atrial flutter cycle length was 250  milliseconds.  The H-V interval was 56 milliseconds.  C.  Rapid ventricular  pacing:  Rapid ventricular pacing was carried out from the RV apex following  catheter ablation at a pacing cycle length of 600 milliseconds and stepwise  decreased down to 450 milliseconds, where V-A Wenckebach was observed.  During rapid ventricular pacemaker, the atrial activation was midline and  decremental.  D.  Programmed ventricular stimulation:  Programmed ventricular stimulation  was carried out from the RV apex at a base drive cycle length of 409  milliseconds.  The S1/S2 interval was stepwise decreased from 540  milliseconds down to 380 milliseconds, where the retrograde AV node ERP was  observed.  During programmed ventricular stimulation, the atrial activation  was midline and decremental.  E.  Rapid atrial pacing:  Rapid atrial pacing was carried out from the high  right atrium and the coronary sinus at a base drive cycle length of 811  milliseconds and stepwise decreased down 390 milliseconds, where A-V  Wenckebach was observed.  During rapid atrial pacing, the P-R interval was  less than the R-R interval and no inducible SVT.  F.  Programmed atrial stimulation:  Programmed atrial stimulation was  carried out from the high atrium as well as the coronary sinus at a basic  drive cycle length of 914 milliseconds.  The S1/S2 interval was stepwise  decreased from 540 milliseconds down to 250 milliseconds, where the AV node ERP was observed.  During  programmed atrial  stimulation, there were no A-H  jumps, no echo beats and no inducible SVT following catheter ablation. G.  G.  Arrhythmias observed:  1.  Atrial flutter:  Initiation present at the time of EP study.  Duration      was sustained.  Cycle length was 250 milliseconds.      1.  Mapping:  Mapping of the atrial flutter demonstrated clockwise          tricuspid annular reentrant atrial flutter.          1.  RF energy application:  A total of 9 RF energy applications were              delivered.  During the sixth RF energy application, atrial  flutter isthmus block was demonstrated and sinus rhythm was              restored.  Three bonus RF energy applications were subsequently              delivered and the patient was observed for 20 minutes without              any evidence of atrial flutter isthmus conduction.   V. CONCLUSION:  This study demonstrates successful electrophysiologic study  and catheter ablation of typical clockwise atrial flutter with a total of 9  radiofrequency energy applications delivered to the atrial flutter isthmus,  resulting in termination of atrial flutter, restoration of sinus rhythm, and  creation of bidirectional block in the atrial flutter isthmus.           ______________________________  Doylene Canning. Ladona Ridgel, M.D.     GWT/MEDQ  D:  01/16/2005  T:  01/17/2005  Job:  [id number]   cc:   Gordy Savers, M.D. [facility]  [address] [place]  [place] [address]   Olga Millers, M.D. [facility]  [address]  [address]  [place]  [place] [address]

## 2010-08-07 ENCOUNTER — Ambulatory Visit (INDEPENDENT_AMBULATORY_CARE_PROVIDER_SITE_OTHER): Payer: Medicare Other | Admitting: *Deleted

## 2010-08-07 DIAGNOSIS — I4891 Unspecified atrial fibrillation: Secondary | ICD-10-CM

## 2010-08-07 MED ORDER — WARFARIN SODIUM 2.5 MG PO TABS: ORAL_TABLET | ORAL | Status: DC

## 2010-08-21 ENCOUNTER — Ambulatory Visit (INDEPENDENT_AMBULATORY_CARE_PROVIDER_SITE_OTHER): Payer: Medicare Other | Admitting: *Deleted

## 2010-08-21 DIAGNOSIS — I4891 Unspecified atrial fibrillation: Secondary | ICD-10-CM

## 2010-08-29 ENCOUNTER — Emergency Department (HOSPITAL_COMMUNITY): Payer: Medicare Other

## 2010-08-29 ENCOUNTER — Inpatient Hospital Stay (HOSPITAL_COMMUNITY)
Admission: EM | Admit: 2010-08-29 | Discharge: 2010-09-04 | DRG: 481 | Disposition: A | Discharge status: Skilled Nursing Facility | Payer: Medicare Other | Attending: Internal Medicine | Admitting: Internal Medicine

## 2010-08-29 DIAGNOSIS — I739 Peripheral vascular disease, unspecified: Secondary | ICD-10-CM | POA: Diagnosis present

## 2010-08-29 DIAGNOSIS — D72829 Elevated white blood cell count, unspecified: Secondary | ICD-10-CM | POA: Diagnosis present

## 2010-08-29 DIAGNOSIS — F329 Major depressive disorder, single episode, unspecified: Secondary | ICD-10-CM | POA: Diagnosis present

## 2010-08-29 DIAGNOSIS — I4891 Unspecified atrial fibrillation: Secondary | ICD-10-CM | POA: Diagnosis present

## 2010-08-29 DIAGNOSIS — R7301 Impaired fasting glucose: Secondary | ICD-10-CM | POA: Diagnosis present

## 2010-08-29 DIAGNOSIS — F3289 Other specified depressive episodes: Secondary | ICD-10-CM | POA: Diagnosis present

## 2010-08-29 DIAGNOSIS — I714 Abdominal aortic aneurysm, without rupture, unspecified: Secondary | ICD-10-CM | POA: Diagnosis present

## 2010-08-29 DIAGNOSIS — I2510 Atherosclerotic heart disease of native coronary artery without angina pectoris: Secondary | ICD-10-CM | POA: Diagnosis present

## 2010-08-29 DIAGNOSIS — E785 Hyperlipidemia, unspecified: Secondary | ICD-10-CM | POA: Diagnosis present

## 2010-08-29 DIAGNOSIS — Z9981 Dependence on supplemental oxygen: Secondary | ICD-10-CM

## 2010-08-29 DIAGNOSIS — Z9181 History of falling: Secondary | ICD-10-CM

## 2010-08-29 DIAGNOSIS — Z7982 Long term (current) use of aspirin: Secondary | ICD-10-CM

## 2010-08-29 DIAGNOSIS — M1990 Unspecified osteoarthritis, unspecified site: Secondary | ICD-10-CM | POA: Diagnosis present

## 2010-08-29 DIAGNOSIS — Z8546 Personal history of malignant neoplasm of prostate: Secondary | ICD-10-CM

## 2010-08-29 DIAGNOSIS — I701 Atherosclerosis of renal artery: Secondary | ICD-10-CM | POA: Diagnosis present

## 2010-08-29 DIAGNOSIS — I6529 Occlusion and stenosis of unspecified carotid artery: Secondary | ICD-10-CM | POA: Diagnosis present

## 2010-08-29 DIAGNOSIS — R1310 Dysphagia, unspecified: Secondary | ICD-10-CM | POA: Diagnosis present

## 2010-08-29 DIAGNOSIS — W19XXXA Unspecified fall, initial encounter: Secondary | ICD-10-CM | POA: Diagnosis present

## 2010-08-29 DIAGNOSIS — Z8673 Personal history of transient ischemic attack (TIA), and cerebral infarction without residual deficits: Secondary | ICD-10-CM

## 2010-08-29 DIAGNOSIS — Z7901 Long term (current) use of anticoagulants: Secondary | ICD-10-CM

## 2010-08-29 DIAGNOSIS — R0902 Hypoxemia: Secondary | ICD-10-CM | POA: Diagnosis present

## 2010-08-29 DIAGNOSIS — E876 Hypokalemia: Secondary | ICD-10-CM | POA: Diagnosis not present

## 2010-08-29 DIAGNOSIS — J811 Chronic pulmonary edema: Secondary | ICD-10-CM | POA: Diagnosis not present

## 2010-08-29 DIAGNOSIS — S7290XA Unspecified fracture of unspecified femur, initial encounter for closed fracture: Principal | ICD-10-CM | POA: Diagnosis present

## 2010-08-29 DIAGNOSIS — Z01810 Encounter for preprocedural cardiovascular examination: Secondary | ICD-10-CM

## 2010-08-29 LAB — CBC
HCT: 42.8 % (ref 39.0–52.0)
Hemoglobin: 14.4 g/dL (ref 13.0–17.0)
MCH: 29.3 pg (ref 26.0–34.0)
MCHC: 33.6 g/dL (ref 30.0–36.0)
MCV: 87.2 fL (ref 78.0–100.0)
Platelets: 265 10*3/uL (ref 150–400)
RBC: 4.91 MIL/uL (ref 4.22–5.81)
RDW: 14.3 % (ref 11.5–15.5)
WBC: 14.4 10*3/uL — ABNORMAL HIGH (ref 4.0–10.5)

## 2010-08-29 LAB — SAMPLE TO BLOOD BANK

## 2010-08-29 LAB — URINALYSIS, MICROSCOPIC ONLY
Bilirubin Urine: NEGATIVE
Glucose, UA: NEGATIVE mg/dL
Specific Gravity, Urine: 1.016 (ref 1.005–1.030)
pH: 6 (ref 5.0–8.0)

## 2010-08-29 LAB — HEPATIC FUNCTION PANEL
ALT: 11 U/L (ref 0–53)
Albumin: 3.2 g/dL — ABNORMAL LOW (ref 3.5–5.2)
Alkaline Phosphatase: 161 U/L — ABNORMAL HIGH (ref 39–117)
Indirect Bilirubin: 0.4 mg/dL (ref 0.3–0.9)
Total Bilirubin: 0.5 mg/dL (ref 0.3–1.2)
Total Protein: 6.4 g/dL (ref 6.0–8.3)

## 2010-08-29 LAB — POCT I-STAT, CHEM 8
BUN: 5 mg/dL — ABNORMAL LOW (ref 6–23)
Calcium, Ion: 1.1 mmol/L — ABNORMAL LOW (ref 1.12–1.32)
Chloride: 102 mEq/L (ref 96–112)
Glucose, Bld: 159 mg/dL — ABNORMAL HIGH (ref 70–99)
TCO2: 27 mmol/L (ref 0–100)

## 2010-08-29 LAB — PROTIME-INR
INR: 1.65 — ABNORMAL HIGH (ref 0.00–1.49)
Prothrombin Time: 19.7 seconds — ABNORMAL HIGH (ref 11.6–15.2)

## 2010-08-30 ENCOUNTER — Inpatient Hospital Stay (HOSPITAL_COMMUNITY): Payer: Medicare Other

## 2010-08-30 LAB — BASIC METABOLIC PANEL WITH GFR
BUN: 8 mg/dL (ref 6–23)
Calcium: 8 mg/dL — ABNORMAL LOW (ref 8.4–10.5)
GFR calc Af Amer: >60 mL/min (ref >60)
GFR calc non Af Amer: >60 mL/min (ref >60)
Potassium: 3.4 meq/L — ABNORMAL LOW (ref 3.5–5.1)
Sodium: 133 meq/L — ABNORMAL LOW (ref 135–145)

## 2010-08-30 LAB — BASIC METABOLIC PANEL
CO2: 26 mEq/L (ref 19–32)
Chloride: 98 mEq/L (ref 96–112)
Creatinine, Ser: 0.49 mg/dL (ref 0.4–1.5)
Glucose, Bld: 154 mg/dL — ABNORMAL HIGH (ref 70–99)

## 2010-08-30 LAB — CBC
HCT: 37.9 % — ABNORMAL LOW (ref 39.0–52.0)
Hemoglobin: 13 g/dL (ref 13.0–17.0)
MCH: 29.5 pg (ref 26.0–34.0)
MCHC: 34.3 g/dL (ref 30.0–36.0)
MCV: 85.9 fL (ref 78.0–100.0)
Platelets: 244 K/uL (ref 150–400)
RBC: 4.41 MIL/uL (ref 4.22–5.81)
RDW: 14.4 % (ref 11.5–15.5)
WBC: 18.2 K/uL — ABNORMAL HIGH (ref 4.0–10.5)

## 2010-08-30 LAB — ABO/RH: ABO/RH(D): O POS

## 2010-08-30 LAB — PROTIME-INR
INR: 1.92 — ABNORMAL HIGH (ref 0.00–1.49)
Prothrombin Time: 22.1 s — ABNORMAL HIGH (ref 11.6–15.2)

## 2010-08-30 LAB — TYPE AND SCREEN

## 2010-08-31 ENCOUNTER — Inpatient Hospital Stay (HOSPITAL_COMMUNITY): Payer: Medicare Other

## 2010-08-31 LAB — PREPARE FRESH FROZEN PLASMA: Unit division: 0

## 2010-08-31 LAB — BASIC METABOLIC PANEL
BUN: 10 mg/dL (ref 6–23)
Creatinine, Ser: 0.5 mg/dL (ref 0.50–1.35)
GFR calc Af Amer: >60 mL/min (ref >60)
GFR calc non Af Amer: >60 mL/min (ref >60)
Potassium: 3 mEq/L — ABNORMAL LOW (ref 3.5–5.1)

## 2010-08-31 LAB — CBC
MCH: 29.2 pg (ref 26.0–34.0)
MCHC: 34.3 g/dL (ref 30.0–36.0)
Platelets: 195 10*3/uL (ref 150–400)
RBC: 3.63 MIL/uL — ABNORMAL LOW (ref 4.22–5.81)

## 2010-08-31 LAB — PROTIME-INR: Prothrombin Time: 21.9 seconds — ABNORMAL HIGH (ref 11.6–15.2)

## 2010-08-31 NOTE — H&P (Signed)
[name], SMESTAD NO.:  [id number]  MEDICAL RECORD NO.:  [id number]  LOCATION:  [facility]                         FACILITY:  [facility]  PHYSICIAN:  Mariea Stable, MD   DATE OF BIRTH:  [birth year]  DATE OF ADMISSION:  08/29/2010 DATE OF DISCHARGE:                             HISTORY & PHYSICAL   PRIMARY CARE PHYSICIAN:  Gordy Savers, MD  CARDIOLOGIST:  Madolyn Frieze. Jens Som, MD, [facility]  CHIEF COMPLAINT:  Fall and left hip pain.  HISTORY OF PRESENT ILLNESS:  [name] is an 77 year old gentleman with past medical history significant for nonsustained V tach as well as atrial fibrillation, on Coumadin therapy, who presents with chief complaint of fall and left hip pain.  The patient's family including daughter and son-in-law are at bedside and help provide some of the history.  The patient was walking into or out of the room at which time he bent over to pick up something from the floor.  The son-in-law was present and noticed that the patient lost his balance as he was getting which led to some stumbling and subsequent fall.  He states that he landed on his hip and the patient's head landed under the dog preventing the patient from hitting his head on the floor.  The patient developed significant left hip pain and EMS was called and brought to the emergency department.  PAST MEDICAL HISTORY: 1. History nonsustained ventricular tachycardia. 2. History of the VDRF, status post intubation on May 01, 2010,     and extubation on May 03, 2010. 3. History of atrial fibrillation, status post radiofrequency ablation     in the past, followed by Dr. Jens Som, currently on rate control     therapy. 4. History of abdominal aortic aneurysm measured 4 cm per ultrasound     in February 2012. 5. Depression. 6. History of cerebrovascular accident. 7. History of nondisplaced pelvic fracture in February 2012. 8. Impaired fasting glucose. 9. Chronic  leukocytosis.  MEDICATIONS: 1. Sertraline 25 mg p.o. daily. 2. Diclofenac gel 3% topical applied b.i.d. 3. Warfarin 2.5 mg 1/2-1 tablet daily. 4. Pravastatin 40 mg p.o. daily. 5. Klor-Con 20 mEq p.o. daily. 6. Metoprolol tartrate 50 mg 1 tablet p.o. b.i.d. 7. Furosemide 40 mg 1/2 a tablet p.o. nightly. 8. Diltiazem CD 180 mg p.o. daily. 9. Digoxin 0.125 mg p.o. daily. 10.Aspirin 81 mg p.o. daily.  SOCIAL HISTORY:  The patient lives with his daughter and son-in-law.  He has an extensive tobacco history, but quit greater than 10 years ago. He denies any alcohol or drug use.  ADVANCE DIRECTIVES:  The patient currently is a full code as of this timeframe.  FAMILY HISTORY:  Noncontributory.  REVIEW OF SYSTEMS:  As per HPI.  Of note, the patient denies any chest pain, palpitations, or loss of consciousness.  PHYSICAL EXAMINATION:  VITAL SIGNS:  Temperature 97.8, blood pressure 149/75, heart rate 66, respirations 16, and oxygen saturation 89% on room air followed by 94% on 2 liters. GENERAL:  This is an elderly man, lying in bed, in no acute distress, who is quite pleasant. HEENT:  Head is normocephalic and atraumatic.  Pupils are equally round  and reactive to light.  Extraocular movements are intact.  Sclerae are anicteric.  Mucous membranes are moist. NECK:  Supple.  There is no JVD. LUNGS:  Clear to auscultation anterolaterally. HEART:  S1 and S2 with normal rate, but irregular rhythm.  There are no obvious murmurs, gallops, or rubs. ABDOMEN:  Positive bowel sounds, soft, nontender, nondistended.  No guarding. GU:  Foley is in place. MUSCULOSKELETAL:  The patient has significant pain to palpation of the left hip near the greater trochanteric area. NEUROLOGIC:  The patient is awake, alert, and oriented and grossly nonfocal.  LABORATORY DATA:  WBC 14.4, hemoglobin 14.4, and platelets 265.  PT 19.7 and INR 1.65.  IMAGING:  Left comminuted intratrochanteric fracture of the  left femur.  ASSESSMENT/PLAN: 1. Left femoral fracture as noted above.  At this point, we will admit     to tele bed and provide pain control with opioid analgesics.     Orthopedics has been called by the ED physician and will see the     patient.  We will follow for recommendations.  We will hold     Coumadin and aspirin until evaluated by Ortho in anticipation of     possible surgery. 2. Atrial fibrillation.  The patient currently appears rate     controlled, but appears to be in atrial fibrillation on exam.  We     will obtain an EKG and monitor on telemetry.  Of note, the patient     is on Coumadin, though his INR is subtherapeutic at 1.65.  We will     hold Coumadin at this point with anticipation of possible surgery. 3. Atrial fibrillation.  Again, the patient appears to be rate     controlled and therefore we will continue with AV nodal blocking     agents including metoprolol and diltiazem as well as digoxin at     home dosages.  We will also check digoxin level to ensure he is     within therapeutic range and not in the toxic range. 4. History of nonsustained ventricular tachycardia.  We will follow     electrolytes closely and keep a potassium at a goal of 4.  We will     monitor on telemetry as mentioned above. 5. History of abdominal aortic aneurysm.  As mentioned above, the most     recent ultrasound in February demonstrates a stable AAA at 4 cm.     No further workup needed during this admission aside from continued     monitoring as an outpatient. 6. Depression.  We will continue with the patient's home dose of     sertraline. 7. Leukocytosis.  The patient has chronic leukocytosis noted since     January 2012 and is currently stable.  Prior differential is     predominately neutrophils at the 80-90% range with absolute     neutrophil count ranging 10-15.  At this point, we will not pursue     further workup. 8. Code status.  This was discussed with the patient and  family who is     at bedside and the patient does not have any advance directives or     living wills to their knowledge.  At this point, the patient is a     full code and this should be further addressed throughout the     hospitalization and/or as an outpatient basis.     Mariea Stable, MD     MA/MEDQ  D:  08/29/2010  T:  08/29/2010  Job:  [id number]  cc:   Gordy Savers, MD Madolyn Frieze. Jens Som, MD, [facility]  Electronically Signed by Mariea Stable MD on 08/31/2010 11:21:54 AM

## 2010-09-01 LAB — CBC
MCHC: 34.5 g/dL (ref 30.0–36.0)
Platelets: 243 10*3/uL (ref 150–400)
RDW: 14.3 % (ref 11.5–15.5)

## 2010-09-01 LAB — BASIC METABOLIC PANEL
BUN: 15 mg/dL (ref 6–23)
Creatinine, Ser: 0.58 mg/dL (ref 0.50–1.35)
GFR calc Af Amer: >60 mL/min (ref >60)
GFR calc non Af Amer: >60 mL/min (ref >60)

## 2010-09-01 LAB — MAGNESIUM: Magnesium: 2 mg/dL (ref 1.5–2.5)

## 2010-09-01 LAB — PROTIME-INR: Prothrombin Time: 30.2 seconds — ABNORMAL HIGH (ref 11.6–15.2)

## 2010-09-01 LAB — PRO B NATRIURETIC PEPTIDE: Pro B Natriuretic peptide (BNP): 3534 pg/mL — ABNORMAL HIGH (ref 0–450)

## 2010-09-02 ENCOUNTER — Inpatient Hospital Stay (HOSPITAL_COMMUNITY): Payer: Medicare Other

## 2010-09-02 LAB — URINALYSIS, ROUTINE W REFLEX MICROSCOPIC
Glucose, UA: NEGATIVE mg/dL
Ketones, ur: NEGATIVE mg/dL
Protein, ur: NEGATIVE mg/dL

## 2010-09-02 LAB — URINE MICROSCOPIC-ADD ON

## 2010-09-02 LAB — BASIC METABOLIC PANEL
Calcium: 8.5 mg/dL (ref 8.4–10.5)
Creatinine, Ser: 0.52 mg/dL (ref 0.50–1.35)
GFR calc Af Amer: >60 mL/min (ref >60)
GFR calc non Af Amer: >60 mL/min (ref >60)

## 2010-09-02 LAB — CBC
MCHC: 33.4 g/dL (ref 30.0–36.0)
RDW: 14.7 % (ref 11.5–15.5)

## 2010-09-02 LAB — PROTIME-INR
INR: 2.84 — ABNORMAL HIGH (ref 0.00–1.49)
Prothrombin Time: 30.3 seconds — ABNORMAL HIGH (ref 11.6–15.2)

## 2010-09-03 LAB — PROTIME-INR
INR: 2.84 — ABNORMAL HIGH (ref 0.00–1.49)
Prothrombin Time: 30.3 seconds — ABNORMAL HIGH (ref 11.6–15.2)

## 2010-09-03 LAB — CBC
HCT: 34.1 % — ABNORMAL LOW (ref 39.0–52.0)
Hemoglobin: 11.4 g/dL — ABNORMAL LOW (ref 13.0–17.0)
MCH: 28.9 pg (ref 26.0–34.0)
MCHC: 33.4 g/dL (ref 30.0–36.0)
RDW: 14.8 % (ref 11.5–15.5)

## 2010-09-03 LAB — BASIC METABOLIC PANEL
BUN: 13 mg/dL (ref 6–23)
CO2: 31 mEq/L (ref 19–32)
Calcium: 8.5 mg/dL (ref 8.4–10.5)
Chloride: 103 mEq/L (ref 96–112)
Creatinine, Ser: <0.47 mg/dL — ABNORMAL LOW (ref 0.50–1.35)

## 2010-09-04 ENCOUNTER — Inpatient Hospital Stay (HOSPITAL_COMMUNITY): Payer: Medicare Other

## 2010-09-04 ENCOUNTER — Encounter: Payer: Medicare Other | Admitting: *Deleted

## 2010-09-04 LAB — CBC
HCT: 34.8 % — ABNORMAL LOW (ref 39.0–52.0)
MCHC: 33.9 g/dL (ref 30.0–36.0)
MCV: 85.7 fL (ref 78.0–100.0)
Platelets: 305 10*3/uL (ref 150–400)
RDW: 14.7 % (ref 11.5–15.5)

## 2010-09-04 LAB — PROTIME-INR: Prothrombin Time: 27.4 seconds — ABNORMAL HIGH (ref 11.6–15.2)

## 2010-09-04 LAB — BASIC METABOLIC PANEL
BUN: 11 mg/dL (ref 6–23)
Calcium: 8.4 mg/dL (ref 8.4–10.5)
Creatinine, Ser: <0.47 mg/dL — ABNORMAL LOW (ref 0.50–1.35)

## 2010-09-05 ENCOUNTER — Telehealth: Payer: Self-pay | Admitting: Cardiology

## 2010-09-05 NOTE — Telephone Encounter (Signed)
Spoke with pt dtr, a physican is supposed to see mr Diesing and he can give the okay for the family to take the pt home. If the pt leaves the facility AMA they were told they would not be able to get any home health help. Pt dtr will call dr Amador Cunas to get help. Deliah Goody

## 2010-09-05 NOTE — Telephone Encounter (Signed)
Father is nursing home , things not doing well today.  Have a couple of question.

## 2010-09-05 NOTE — Telephone Encounter (Signed)
SPOKE WITH PT'S DAUGHTER AT GREAT LENGTH IS WANTING TO TAKE DAD HOME DOES NOT LIKE  FACILITY WHERE HE IS LIVING PER DAUGHTER  FACILITY IS STATING DAUGHTER CANNOT TAKE DAD HOME AND IF SHE DOES HE WILL BE DENIED PT AND ANY HOME NURSING PER DAUGHTER PT WAS  LIVING  WITH HER PRIOR TO THIS INCIDENT  DAUGHTER HAS ALL APPROPRIATE EQUIPMENT AT HOME  WITH THE EXCEPTION OF O2 AND HOSPITAL BED  PT TO SEE MEDICAL DIRECTOR SOMETIME TODAY AFTER 5 PM DAUGHTER AWARE WILL FORWARD TO DR Jens Som FOR REVIEW AND RECOMMENDATIONS./CY

## 2010-09-07 ENCOUNTER — Ambulatory Visit (INDEPENDENT_AMBULATORY_CARE_PROVIDER_SITE_OTHER): Payer: Self-pay | Admitting: Cardiovascular Disease

## 2010-09-07 DIAGNOSIS — R0989 Other specified symptoms and signs involving the circulatory and respiratory systems: Secondary | ICD-10-CM

## 2010-09-08 NOTE — Consult Note (Signed)
NAMETOMIE, ELKO NO.:  [id number]  MEDICAL RECORD NO.:  [id number]  LOCATION:  4707                         FACILITY:  [facility]  PHYSICIAN:  Harvie Junior, M.D.   DATE OF BIRTH:  Jul 28, 1923  DATE OF CONSULTATION: DATE OF DISCHARGE:                                CONSULTATION   The consult is with the Hospitalist Service.  This is an 77 year old gentleman who was at home and fell earlier tonight.  He was evaluated in the emergency room and noted to have an intertrochanteric hip fracture on the left side.  We were consulted for management.  He basically was a household ambulator.  He has not had previous problems with the hip, who basically has tripped over his pyjama and suffered this injury, we were consulted for treatment.  He was complaining of pain in the area of the left hip with any motion.  He is currently lying in bed.  Past medical history is remarkable for having had an abdominal aneurysm, atrial fibrillation, degenerative joint disease, hyperlipidemia, hypertension, prostate cancer and stroke.  SOCIAL HISTORY:  He is not a drug abuser or drinker.  FAMILY HISTORY:  Reviewed and is noncontributory.  ALLERGIES:  HE HAS KNOWN ALLERGIES.  MEDICATIONS:  Are listed in the chart, aspirin, Cardizem, diclofenac, Lanoxin, Lasix, Lopressor, potassium, pravastatin, and sertraline.  PHYSICAL EXAMINATION:  VITAL SIGNS:  His examination shows his temperature is 97.8, pulse 66, respirations 16, pulse ox is 92, temperature is 97.8. GENERAL:  He is a well-developed, well-nourished male, in no acute distress. EYES:  Nondilated. RESPIRATORY:  Not using accessory muscles of respiration. He is alert and oriented x3.  He is with his family. EXTREMITIES:  Left leg shows pain with all range of motion, it is externally rotated and shortened.  His x-ray shows that he has an intertrochanteric hip fracture with some comminution.  LABORATORY DATA:  Laboratory  shows that he has a WBC of 14.4, hemoglobin of 14.4, hematocrit of 42.8.  His Chem-7 is pending.  His coags are PT of 19.7, INR of 1.65.  ASSESSMENT:  Overall, it is our assessment that he is an 77 year old gentleman with a left intertrochanteric hip fracture, Coumadinized for atrial fibrillation.  He is going to need open reduction and internal fixation of the hip.  At this point, we will give him a day to see if his INR will come down.  This will give the Hospitalist Service opportunity to also seek further free medical clearance if they feel this is necessary, then we will plan to take him to the operating room when this clearance is obtained and after a day of allowing his INR to settle out.  His care will be taken over by Dr. Jones Broom in my office, as I am going to be leaving town this afternoon, there would be no change or delay in his care, just based on the fact that he does have the elevated INR and we need to try to get down to a reasonable level somewhere in the 1.5 or below range.     Harvie Junior, M.D.     Ranae Plumber  D:  08/29/2010  T:  08/29/2010  Job:  [id number]  Electronically Signed by Jodi Geralds M.D. on 09/08/2010 12:21:33 AM

## 2010-09-09 NOTE — Discharge Summary (Signed)
[name], [name] NO.:  [id number]  MEDICAL RECORD NO.:  [id number]  LOCATION:  3729                         FACILITY:  [facility]  PHYSICIAN:  Ladell Pier, M.D.   DATE OF BIRTH:  Jan 02, 1924  DATE OF ADMISSION:  08/29/2010 DATE OF DISCHARGE:  09/04/2010                              DISCHARGE SUMMARY   DISCHARGE DIAGNOSES: 1. Left hip fracture status post repair on August 30, 2010, by Dr.     Ave Filter.  He had an intramedullary nail fixation for the left     intertrochanteric proximal femur fracture. 2. Hypokalemia. 3. Dysphagia. 4. History of nonsustained ventricular tachycardia. 5. Atrial fibrillation. 6. Abdominal aortic aneurysm, measuring 4 cm by ultrasound. 7. Depression. 8. History of cerebrovascular accident. 9. History of nondisplaced pelvic fracture in February 2012. 10.Impaired fasting glucose. 11.Chronic leukocytosis. 12.History of ventilator-dependent respiratory failure status post     intubation on May 01, 2010 and extubation May 03, 2010.  DISCHARGE MEDICATIONS: 1. Home oxygen at 2 L. 2. Vicodin 1-2 tablets every 4 hours as needed. 3. Xopenex 0.63 inhaler every 6 hours as needed. 4. Thicken food thickener 5 gums by mouth as needed. 5. Aspirin 81 mg daily. 6. Diclofenac gel 3% twice daily. 7. Digoxin 0.25 mg daily. 8. Diltiazem CD 180 mg daily. 9. Lasix 40 mg half a tablet daily. 10.Klor-Con 20 mEq daily. 11.Metoprolol 50 mg twice daily. 12.Pravastatin 40 mg at bedtime. 13.Sertraline 25 mg every morning. 14.Coumadin 2.5 mg half a tablet on Wednesdays and 1 tablet the other     days.  FOLLOWUP APPOINTMENTS:  The patient to follow up with Dr. Ave Filter in 10- 14 days, [phone].  The patient also to follow up with Dr. Waldron Labs by Friday.  PROCEDURES:  Surgical repair of the left hip on August 30, 2010.  Chest x- ray, improved aeration in the right lung base.  Since examination 2 days ago, no mild bilateral interstitial and  air space pulmonary edema versus no new abnormalities.  CONSULTANTS:  Cardiology, Madolyn Frieze. Jens Som, MD, [facility]  HISTORY OF PRESENT ILLNESS:  The patient is an 77 year old gentleman, past medical history significant for nonsustained V-tach as well as atrial fibrillation.  He is on chronic Coumadin therapy, presents with chief complaint of fall and left hip pain.  The patient's family including daughter and son-in-law are at bedside and helped provide some of the history.  The patient was walking into or out of the room at which time he went over to pick up something from the floor.  The son-in- law was present and noticed that the patient lost his balance as he was getting what he needed.  He states he has landed on his hip and the patient's head landed under the doors, preventing the patient from hitting his head on the floor.  The patient developed significant left hip pain and EMS was called and he was brought to the emergency room.  Past medical history, family history, social history, meds, allergies, and review of systems per admission H and P.  PHYSICAL EXAMINATION:  VITAL SIGNS:  At the time of discharge; temperature 98.2, pulse 92, respirations 18, blood pressure 132/80, and O2 sat  of 95% on 3 L. GENERAL:  The patient is lying in bed, well-nourished white male. HEENT: Normocephalic and atraumatic.  Pupils reactive to light.  Throat without erythema. CARDIOVASCULAR:  Regular rhythm. LUNGS:  Clear bilaterally. ABDOMEN:  Positive bowel sounds. EXTREMITIES:  Without edema.  HOSPITAL COURSE: 1. Hip fracture:  The patient was admitted to the hospital.  Surgery     was consulted.  The patient had repair of the hip.  Cardiology was     consulted for preop clearance.  The patient underwent surgery and     is doing well.  The patient should be discharged to a rehab, but     the pay for the rehab is 50 dollars a day and family at this point     in time states that they cannot afford  50 dollars a day, so they     want to take the patient home.  So the patient will be discharged     home with home PT and OT if family does not want him to get that     done here. 2. Hypokalemia:  The patient's potassium is decreased secondary to     Lasix.  We will give him 40 q.4 x2 here and then continue him on     his home dose. 3. Hypoxia:  The patient was noted to have low oxygen levels using a     nasal cannula.  Gave him an extra dose of Lasix yesterday and will     discharge him on Lasix, his home dose and he could follow up with     his PCP at that time.  It could be readdressed if he still needs a     home oxygen. 4. Dysphagia:  The patient was noted to have some coughing with     swallowing.  So he is on a dysphagia III diet at present and need     to get modified barium swallow prior to being discharged. 5. Chronic leukocytosis:  The patient does have chronic leukocytosis     and that can be followed up by his PCP. 6. Atrial fibrillation:  The patient did have an episode prior to     surgery that he had fast heart rate and had to be on a Cardizem     drip that is still corrected and his heart rate is normal on his     home medication regimen.  DISCHARGE LABS:  PT 27.4 and INR 2.50.  Sodium 136, potassium 3.0, chloride 98, CO2 31, glucose 128, BUN 11, and creatinine 0.47. WBC 17.5, hemoglobin 11.8, MCV 85.7, and platelets 305.  UA negative.  Magnesium 2.0.  Time spent with the patient and doing this discharge is approximately 45 minutes.     Ladell Pier, M.D.     [place]/MEDQ  D:  09/04/2010  T:  09/04/2010  Job:  [id number]  Electronically Signed by Ladell Pier M.D. on 09/09/2010 07:36:12 PM

## 2010-09-10 ENCOUNTER — Ambulatory Visit: Payer: Medicare Other | Admitting: Internal Medicine

## 2010-09-15 ENCOUNTER — Inpatient Hospital Stay (HOSPITAL_COMMUNITY)
Admission: EM | Admit: 2010-09-15 | Discharge: 2010-09-21 | DRG: 378 | Disposition: A | Discharge status: Home Health | Payer: Medicare Other | Attending: Hospitalist | Admitting: Hospitalist

## 2010-09-15 ENCOUNTER — Emergency Department (HOSPITAL_COMMUNITY): Payer: Medicare Other

## 2010-09-15 DIAGNOSIS — I10 Essential (primary) hypertension: Secondary | ICD-10-CM | POA: Diagnosis present

## 2010-09-15 DIAGNOSIS — F068 Other specified mental disorders due to known physiological condition: Secondary | ICD-10-CM | POA: Diagnosis present

## 2010-09-15 DIAGNOSIS — D62 Acute posthemorrhagic anemia: Secondary | ICD-10-CM | POA: Diagnosis present

## 2010-09-15 DIAGNOSIS — D72829 Elevated white blood cell count, unspecified: Secondary | ICD-10-CM | POA: Diagnosis present

## 2010-09-15 DIAGNOSIS — Z87891 Personal history of nicotine dependence: Secondary | ICD-10-CM

## 2010-09-15 DIAGNOSIS — E876 Hypokalemia: Secondary | ICD-10-CM | POA: Diagnosis present

## 2010-09-15 DIAGNOSIS — Z8546 Personal history of malignant neoplasm of prostate: Secondary | ICD-10-CM

## 2010-09-15 DIAGNOSIS — K921 Melena: Principal | ICD-10-CM | POA: Diagnosis present

## 2010-09-15 DIAGNOSIS — T45515A Adverse effect of anticoagulants, initial encounter: Secondary | ICD-10-CM | POA: Diagnosis present

## 2010-09-15 DIAGNOSIS — Z7901 Long term (current) use of anticoagulants: Secondary | ICD-10-CM

## 2010-09-15 DIAGNOSIS — I4891 Unspecified atrial fibrillation: Secondary | ICD-10-CM | POA: Diagnosis present

## 2010-09-15 DIAGNOSIS — Z7982 Long term (current) use of aspirin: Secondary | ICD-10-CM

## 2010-09-15 LAB — CBC
HCT: 22.1 % — ABNORMAL LOW (ref 39.0–52.0)
HCT: 23.3 % — ABNORMAL LOW (ref 39.0–52.0)
MCH: 29.1 pg (ref 26.0–34.0)
MCV: 88 fL (ref 78.0–100.0)
MCV: 87.9 fL (ref 78.0–100.0)
RBC: 2.65 MIL/uL — ABNORMAL LOW (ref 4.22–5.81)
RDW: 16.3 % — ABNORMAL HIGH (ref 11.5–15.5)
RDW: 16.1 % — ABNORMAL HIGH (ref 11.5–15.5)
WBC: 15.7 10*3/uL — ABNORMAL HIGH (ref 4.0–10.5)
WBC: 13.2 10*3/uL — ABNORMAL HIGH (ref 4.0–10.5)

## 2010-09-15 LAB — COMPREHENSIVE METABOLIC PANEL
Albumin: 2.5 g/dL — ABNORMAL LOW (ref 3.5–5.2)
Alkaline Phosphatase: 135 U/L — ABNORMAL HIGH (ref 39–117)
BUN: 46 mg/dL — ABNORMAL HIGH (ref 6–23)
Chloride: 99 mEq/L (ref 96–112)
Potassium: 2.2 mEq/L — CL (ref 3.5–5.1)
Total Bilirubin: 0.4 mg/dL (ref 0.3–1.2)

## 2010-09-15 LAB — URINALYSIS, ROUTINE W REFLEX MICROSCOPIC
Bilirubin Urine: NEGATIVE
Leukocytes, UA: NEGATIVE
Nitrite: NEGATIVE
Specific Gravity, Urine: 1.017 (ref 1.005–1.030)
pH: 6.5 (ref 5.0–8.0)

## 2010-09-15 LAB — DIFFERENTIAL
Eosinophils Relative: 0 % (ref 0–5)
Lymphocytes Relative: 9 % — ABNORMAL LOW (ref 12–46)
Lymphs Abs: 1.4 10*3/uL (ref 0.7–4.0)
Monocytes Absolute: 0.7 10*3/uL (ref 0.1–1.0)
Monocytes Relative: 5 % (ref 3–12)

## 2010-09-15 LAB — POCT I-STAT, CHEM 8
BUN: 48 mg/dL — ABNORMAL HIGH (ref 6–23)
Calcium, Ion: 0.95 mmol/L — ABNORMAL LOW (ref 1.12–1.32)
Chloride: 99 mEq/L (ref 96–112)
Creatinine, Ser: 0.6 mg/dL (ref 0.50–1.35)
Glucose, Bld: 194 mg/dL — ABNORMAL HIGH (ref 70–99)

## 2010-09-15 LAB — DIGOXIN LEVEL: Digoxin Level: <0.3 ng/mL — ABNORMAL LOW (ref 0.8–2.0)

## 2010-09-15 LAB — APTT: aPTT: 45 seconds — ABNORMAL HIGH (ref 24–37)

## 2010-09-16 LAB — CBC
HCT: 24.1 % — ABNORMAL LOW (ref 39.0–52.0)
HCT: 25 % — ABNORMAL LOW (ref 39.0–52.0)
HCT: 22.1 % — ABNORMAL LOW (ref 39.0–52.0)
Hemoglobin: 8.1 g/dL — ABNORMAL LOW (ref 13.0–17.0)
Hemoglobin: 8.2 g/dL — ABNORMAL LOW (ref 13.0–17.0)
MCH: 29.6 pg (ref 26.0–34.0)
MCH: 30.3 pg (ref 26.0–34.0)
MCHC: 32.8 g/dL (ref 30.0–36.0)
MCV: 88.3 fL (ref 78.0–100.0)
MCV: 90.6 fL (ref 78.0–100.0)
Platelets: 187 10*3/uL (ref 150–400)
RBC: 2.73 MIL/uL — ABNORMAL LOW (ref 4.22–5.81)
RBC: 2.77 MIL/uL — ABNORMAL LOW (ref 4.22–5.81)
RBC: 2.44 MIL/uL — ABNORMAL LOW (ref 4.22–5.81)
RDW: 16.6 % — ABNORMAL HIGH (ref 11.5–15.5)
WBC: 10.9 10*3/uL — ABNORMAL HIGH (ref 4.0–10.5)

## 2010-09-16 LAB — PREPARE FRESH FROZEN PLASMA: Unit division: 0

## 2010-09-16 LAB — BASIC METABOLIC PANEL
BUN: 18 mg/dL (ref 6–23)
CO2: 34 mEq/L — ABNORMAL HIGH (ref 19–32)
Chloride: 108 mEq/L (ref 96–112)
Chloride: 108 mEq/L (ref 96–112)
Glucose, Bld: 110 mg/dL — ABNORMAL HIGH (ref 70–99)
Glucose, Bld: 154 mg/dL — ABNORMAL HIGH (ref 70–99)
Potassium: 2.3 mEq/L — CL (ref 3.5–5.1)
Potassium: 2.8 mEq/L — ABNORMAL LOW (ref 3.5–5.1)
Sodium: 147 mEq/L — ABNORMAL HIGH (ref 135–145)

## 2010-09-16 NOTE — H&P (Addendum)
NAMECAMDAN, BURDI NO.:  [id number]  MEDICAL RECORD NO.:  [id number]  LOCATION:  2916                         FACILITY:  [facility]  PHYSICIAN:  Mick Sell, MD DATE OF BIRTH:  September 03, 1923  DATE OF ADMISSION:  09/15/2010 DATE OF DISCHARGE:                             HISTORY & PHYSICAL   PRIMARY CARE PHYSICIAN:  Gordy Savers, MD  CARDIOLOGIST:  Madolyn Frieze. Jens Som, MD, [facility]  CHIEF COMPLAINT:  Vomiting and black tarry stools.  HISTORY OF PRESENT ILLNESS:  Mr. Winch is an 77 year old gentleman with moderate dementia as well as a history of atrial fibrillation on Coumadin who was recently discharged, September 04, 2010, following a left hip fracture with repair done on August 30, 2010.  He is brought in today by his son-in-law and daughter after noting dark black tarry stools for 1-2 days and vomiting x1 day.  Of note, the patient had his INR checked by a visiting nurse yesterday and he was told to stop the Coumadin as his INR was elevated.  He also has not been able to take any oral medications since yesterday morning.  The patient has had no fevers, cough, loss of consciousness, chest pain, shortness of breath.  No extremity edema.  In the ED, the patient was noted to have an elevated INR and a low hemoglobin.  He was started on FFP and has been typed and crossed for 2 units.  PAST MEDICAL HISTORY: 1. History of AFib and nonsustained V tach.  He is followed by Dr.     Jens Som. 2. History of left femoral fracture August 29, 2010, as well as     intramedullary nail fixation on August 30, 2010, by Dr. Ave Filter. 3. History of abdominal aortic aneurysm 4 cm by ultrasound on February     2012. 4. Dementia. 5. Depression. 6. History of CVA. 7. History of nondisplaced pelvic fracture in February 2012. 8. Chronic leukocytosis. 9. Impaired fasting glucose. 10.Hypertension. 11.History of prostate cancer. 12.History of aspiration with vdrf in Feb  2012  SOCIAL HISTORY:  The patient lives with his daughter and son-in-law.  He is a retired Chartered loss adjuster and Naval architect.  He quit tobacco 10 years ago after an extensive history.  ADVANCED DIRECTIVES:  The patient is full code after discussion with his daughter and son-in-law.  They are however, concerned about his quality of life should he ever need resusciation and intubation and are open to further discussion.  FAMILY HISTORY:  Noncontributory.  MEDICATIONS:  Per the patient's most recent discharge med summary from September 04, 2010,  he is supposed to be on: 1. Aspirin 81 mg once a day. 2. Pravastatin 40 mg once a day. 3. Diltiazem CD 180 one tablet once a day. 4. Warfarin 2.5 mg half a tablet once a day and 1 tablet other days. 5. Furosemide 40 mg half a tablet a day. 6. Potassium chloride 20 mEq 1 tablet a day. 7. Sertraline 25 mg 1 tablet a day. 8. Metoprolol 50 mg 1 tablet twice a day. 9. Digoxin 0.125 mg once a day. 10.Diclofenac gel 3% apply topically. 11.Home O2 2 liters as needed.  ALLERGIES:  The  patient has no known drug allergies.  PHYSICAL EXAMINATION:  VITAL SIGNS:  Temperature 37, pulse initially 120 with a regular rate down to 110, blood pressure 100/83, respirations 24, sat of 97% on room air 99% on 2 liters. GENERAL:  The patient is an elderly demented gentleman who is lying quietly in bed.  He is edentulous. HEENT:  Pupils are equal, round, reactive to light and accommodation with mild pallor, no icterus.  Oropharynx mucous membranes are dry. NECK:  Supple.  No anterior cervical, posterior cervical or supraclavicular lymphadenopathy. HEART:  Irregular. LUNGS:  Clear to auscultation bilaterally. ABDOMEN:  Soft.  Mildly tender to palpation left lower quadrant. Nondistended.  No hepatosplenomegaly. EXTREMITIES:  No edema.  The patient has on his left hip region two small incisions which have staples in place and has some mild erythema but are otherwise  healing well.  No drainage.  No tenderness. NEURO:  He is oriented to himself, but not to year or to the city.  He knows he is in the hospital.  Strength is 5/5 bilateral upper and lower extremity.  Cranial nerves II through XII are intact grossly.  LABORATORY DATA:  White blood count elevated at 15.7, hemoglobin 7.8, platelets 317.  Of note he has a chronic leukocytosis in September 04, 2010. His white count was 17.5, hemoglobin was 11.8, platelets were 305.  His INR is 4.2, PTT 45.  Sodium 140, potassium 2.2, chloride 99, CO2 30, BUN 48, creatinine 0.6, glucose 194.  Alk phos 135, AST 22, ALT 18.  Total protein 5.0, albumin 2.5, calcium 8.1, magnesium 2.1.  CK 28, troponin less than 0.3.  Digoxin level less than 0.3.  Urinalysis is negative. Fecal occult blood test was positive.  X-RAY:  The patient had a chest x-ray done on admission shows the cardiac silhouette is borderline enlarged, lungs are mildly hyperexpanded with mildly prominent interstitial markings which are improved from x-ray from September 02, 2010.  IMPRESSION:  An 77 year old moderately demented gentleman status post left hip fracture and repair 2 weeks ago now with elevated INR and black tarry stools as well as nausea and vomiting.  Hemoglobin has decreased over the last 2 weeks for 11.8 in September 04, 2010, to 7.2 today.  His platelets are within normal limits.  He has a chronic elevated leukocytosis.  The patient has not used any nonsteroidal antiinflammatory drugs recently and is also not on a proton pump inhibitor.  I suspect he has gastritis and given the elevated INR is having an upper GI bleed.  He does have a history of aortic aneurysm but I do not feel that he has any evidence of a complication from this.  Of note, he has not been taking his oral medications which may explain partially his tachycardia with atrial fibrillation. 1. Upper GI bleed.  We will place the patient on IV proton pump     inhibitor.  We will  also give the patient 2 units of FFP as well as     2 units of packed red blood cells.  He is also to receive vitamin     K.  We will monitor him for recurrent bleeding and keep two large     bore IVs in place in case of further resuscitation. 2. Atrial fibrillation.  We will give the patient his home metoprolol     and digoxin for now.  We can restart his diltiazem if he remains     stable.  We will also place him  back on his other outpatient     medications including the furosemide.  We will hold his aspirin and     his warfarin. 3. Hypokalemia.  We will increase his replacements dosing of this. 4. Advance directive.  I had a long discussion with the family regarding his     code status and the fact that he has had several admissions over     the last year including a recent hip fracture.  They prefer him to     remain full code for now but are willing to consider alternative     measures if needed.     Mick Sell, MD     DPF/MEDQ  D:  09/15/2010  T:  09/16/2010  Job:  [id number]  Electronically Signed by Clydie Braun MD on 09/16/2010 12:18:01 PM

## 2010-09-17 ENCOUNTER — Ambulatory Visit (INDEPENDENT_AMBULATORY_CARE_PROVIDER_SITE_OTHER): Payer: Self-pay | Admitting: Cardiology

## 2010-09-17 DIAGNOSIS — R0989 Other specified symptoms and signs involving the circulatory and respiratory systems: Secondary | ICD-10-CM

## 2010-09-17 LAB — CBC
HCT: 23.6 % — ABNORMAL LOW (ref 39.0–52.0)
HCT: 24.3 % — ABNORMAL LOW (ref 39.0–52.0)
Hemoglobin: 8 g/dL — ABNORMAL LOW (ref 13.0–17.0)
MCH: 30.6 pg (ref 26.0–34.0)
MCHC: 33.5 g/dL (ref 30.0–36.0)
Platelets: ADEQUATE 10*3/uL (ref 150–400)
RBC: 2.91 MIL/uL — ABNORMAL LOW (ref 4.22–5.81)
RBC: 2.66 MIL/uL — ABNORMAL LOW (ref 4.22–5.81)
RDW: 16.7 % — ABNORMAL HIGH (ref 11.5–15.5)
RDW: 16.7 % — ABNORMAL HIGH (ref 11.5–15.5)
WBC: 12.1 10*3/uL — ABNORMAL HIGH (ref 4.0–10.5)
WBC: 10.4 10*3/uL (ref 4.0–10.5)

## 2010-09-17 LAB — COMPREHENSIVE METABOLIC PANEL
Albumin: 2.2 g/dL — ABNORMAL LOW (ref 3.5–5.2)
Alkaline Phosphatase: 137 U/L — ABNORMAL HIGH (ref 39–117)
BUN: 12 mg/dL (ref 6–23)
CO2: 30 mEq/L (ref 19–32)
Chloride: 111 mEq/L (ref 96–112)
Creatinine, Ser: 0.54 mg/dL (ref 0.50–1.35)
GFR calc Af Amer: >60 mL/min (ref >60)
GFR calc non Af Amer: >60 mL/min (ref >60)
Glucose, Bld: 116 mg/dL — ABNORMAL HIGH (ref 70–99)
Total Bilirubin: 0.4 mg/dL (ref 0.3–1.2)

## 2010-09-17 LAB — PROTIME-INR: INR: 1.13 (ref 0.00–1.49)

## 2010-09-18 ENCOUNTER — Inpatient Hospital Stay (HOSPITAL_COMMUNITY): Payer: Medicare Other

## 2010-09-18 LAB — BASIC METABOLIC PANEL
BUN: 7 mg/dL (ref 6–23)
CO2: 28 mEq/L (ref 19–32)
Calcium: 8.1 mg/dL — ABNORMAL LOW (ref 8.4–10.5)
Chloride: 107 mEq/L (ref 96–112)
Creatinine, Ser: 0.5 mg/dL (ref 0.50–1.35)
Glucose, Bld: 108 mg/dL — ABNORMAL HIGH (ref 70–99)

## 2010-09-18 LAB — CBC
HCT: 24.4 % — ABNORMAL LOW (ref 39.0–52.0)
HCT: 24.8 % — ABNORMAL LOW (ref 39.0–52.0)
Hemoglobin: 8.5 g/dL — ABNORMAL LOW (ref 13.0–17.0)
Hemoglobin: 8.1 g/dL — ABNORMAL LOW (ref 13.0–17.0)
Hemoglobin: 8.3 g/dL — ABNORMAL LOW (ref 13.0–17.0)
MCH: 30 pg (ref 26.0–34.0)
MCH: 30.2 pg (ref 26.0–34.0)
MCH: 30.4 pg (ref 26.0–34.0)
MCHC: 33.2 g/dL (ref 30.0–36.0)
MCHC: 33.5 g/dL (ref 30.0–36.0)
MCV: 92.2 fL (ref 78.0–100.0)
MCV: 91 fL (ref 78.0–100.0)
MCV: 90.8 fL (ref 78.0–100.0)
Platelets: 258 10*3/uL (ref 150–400)
RBC: 2.83 MIL/uL — ABNORMAL LOW (ref 4.22–5.81)
WBC: 13.3 10*3/uL — ABNORMAL HIGH (ref 4.0–10.5)

## 2010-09-18 LAB — GLUCOSE, CAPILLARY: Glucose-Capillary: 116 mg/dL — ABNORMAL HIGH (ref 70–99)

## 2010-09-19 LAB — PROTIME-INR: INR: 1.16 (ref 0.00–1.49)

## 2010-09-19 LAB — COMPREHENSIVE METABOLIC PANEL
BUN: 4 mg/dL — ABNORMAL LOW (ref 6–23)
Calcium: 8.3 mg/dL — ABNORMAL LOW (ref 8.4–10.5)
GFR calc Af Amer: >60 mL/min (ref >60)
Glucose, Bld: 120 mg/dL — ABNORMAL HIGH (ref 70–99)
Sodium: 141 mEq/L (ref 135–145)
Total Protein: 5.5 g/dL — ABNORMAL LOW (ref 6.0–8.3)

## 2010-09-19 LAB — DIFFERENTIAL
Basophils Relative: 0 % (ref 0–1)
Eosinophils Relative: 2 % (ref 0–5)
Monocytes Absolute: 1 10*3/uL (ref 0.1–1.0)
Monocytes Relative: 10 % (ref 3–12)
Neutro Abs: 8 10*3/uL — ABNORMAL HIGH (ref 1.7–7.7)

## 2010-09-19 LAB — CROSSMATCH
Antibody Screen: NEGATIVE
Unit division: 0

## 2010-09-19 LAB — APTT: aPTT: 31 seconds (ref 24–37)

## 2010-09-19 LAB — CBC
HCT: 26 % — ABNORMAL LOW (ref 39.0–52.0)
Hemoglobin: 8.6 g/dL — ABNORMAL LOW (ref 13.0–17.0)
MCH: 30.2 pg (ref 26.0–34.0)
MCHC: 33.1 g/dL (ref 30.0–36.0)
MCHC: 33.6 g/dL (ref 30.0–36.0)
Platelets: 248 10*3/uL (ref 150–400)
RBC: 2.9 MIL/uL — ABNORMAL LOW (ref 4.22–5.81)
RDW: 17.4 % — ABNORMAL HIGH (ref 11.5–15.5)
WBC: 12.1 10*3/uL — ABNORMAL HIGH (ref 4.0–10.5)

## 2010-09-19 LAB — MAGNESIUM: Magnesium: 1.9 mg/dL (ref 1.5–2.5)

## 2010-09-20 LAB — CBC
HCT: 28.1 % — ABNORMAL LOW (ref 39.0–52.0)
Hemoglobin: 9.3 g/dL — ABNORMAL LOW (ref 13.0–17.0)
MCV: 90.9 fL (ref 78.0–100.0)
MCV: 91.5 fL (ref 78.0–100.0)
Platelets: 274 10*3/uL (ref 150–400)
Platelets: 294 10*3/uL (ref 150–400)
RBC: 2.97 MIL/uL — ABNORMAL LOW (ref 4.22–5.81)
RBC: 2.83 MIL/uL — ABNORMAL LOW (ref 4.22–5.81)
RDW: 17.7 % — ABNORMAL HIGH (ref 11.5–15.5)
WBC: 11.5 10*3/uL — ABNORMAL HIGH (ref 4.0–10.5)
WBC: 11.2 10*3/uL — ABNORMAL HIGH (ref 4.0–10.5)
WBC: 10.3 10*3/uL (ref 4.0–10.5)

## 2010-09-20 LAB — BASIC METABOLIC PANEL
BUN: 5 mg/dL — ABNORMAL LOW (ref 6–23)
Chloride: 103 mEq/L (ref 96–112)
GFR calc Af Amer: >60 mL/min (ref >60)
Glucose, Bld: 89 mg/dL (ref 70–99)
Potassium: 2.5 mEq/L — CL (ref 3.5–5.1)

## 2010-09-20 LAB — POTASSIUM: Potassium: 3 mEq/L — ABNORMAL LOW (ref 3.5–5.1)

## 2010-09-20 NOTE — Consult Note (Signed)
[name], HEIKKILA NO.:  [id number]  MEDICAL RECORD NO.:  [id number]  LOCATION:  4707                         FACILITY:  [facility]  PHYSICIAN:  Bevelyn Buckles. Keya Wynes, MDDATE OF BIRTH:  1924-01-30  DATE OF CONSULTATION: DATE OF DISCHARGE:                                CONSULTATION   REQUESTING PHYSICIAN:  Harvie Junior, MD, Orthopedics.  PRIMARY CARDIOLOGIST:  Madolyn Frieze. Jens Som, MD, [facility]  REASON FOR CONSULTATION:  Preop cardiac evaluation, status post left hip fracture.  HISTORY OF PRESENT ILLNESS:  Mr. Westman is a very pleasant 77 year old male with multiple medical problems including hypertension, hyperlipidemia, chronic atrial fibrillation, history of atrial flutter, status post ablation and vascular disease.  He has been followed closely by Dr. Jens Som.  In reviewing his records, he had a cardiac catheterization in October 2006 which showed minimal nonobstructive coronary artery disease with scattered 25% lesions.  Ejection fraction was normal.  He also had evidence of bilateral renal artery stenosis with a 90% left renal artery stenosis and 75% right lesion.  He underwent stenting of the left renal artery October 2007.  He also has infrarenal abdominal aortic aneurysm which has been stable by recent imaging with a transverse diameter of about 4 cm.  He was admitted in February with a pelvic fracture after a fall. Unfortunately, he developed pneumonia and had to be intubated.  He recovered from that.  Echocardiogram at this time confirmed normal LV function with mildly dilated right ventricle.  Since that time he has continued to struggle.  He was admitted after another fall at home.  He does not really remember the details of what happened.  He was found to have left hip fracture and has now pending repair.  Currently, he is a poor historian after getting some narcotic pain medications.  He denies any chest pain or dyspnea.  He has had  rapid ventricular rates up to 160 here in the hospital in the setting of a fairly significant hip pain.  He denies any heart failure symptoms.  Remainder review of systems is limited due to his mild confusion.  He denies any fevers or chills.  No nausea and vomiting.  PAST MEDICAL HISTORY: 1. Nonobstructive coronary artery disease by cardiac catheterization     in 2006. 2. Chronic atrial fibrillation. 3. Atrial flutter, status post ablation. 4. Bilateral renal artery stenosis, status post stenting of the left     renal artery in 2007. 5. History of strokes. 6. Recurrent falls. 7. Mild carotid artery stenosis. 8. Infrarenal abdominal aortic aneurysmal of 4 cm, stable. 9. History of ventilatory-dependent respiratory failure due to     pneumonia in February 2012. 10.Hyperlipidemia. 11.Depression.  CURRENT MEDICATIONS: 1. Voltaren topical. 2. Digoxin 0.125 a day. 3. Diltiazem 180 a day. 4. Metoprolol 50 b.i.d. 5. Potassium 20 a day. 6. Crestor 5 mg a day. 7. Zoloft 25 mg a day. 8. Morphine.  ALLERGIES:  No known drug allergies.  SOCIAL HISTORY:  He lives with his daughter and son-in-law.  He has extensive tobacco use history, but quit greater than 10 years ago.  FAMILY HISTORY:  Notable for coronary artery disease.  His father had  a heart attack at 81.  PHYSICAL EXAMINATION:  GENERAL:  He is elderly frail appearing male, in no acute distress. VITAL SIGNS:  Blood pressure is 162/68, heart rate ranges from 90 to 160, currently about 115.  He is satting 90% on room air. HEENT:  Normal except for poor dentition. NECK:  Supple.  There is no JVD.  His carotids are 1+ bilaterally with bilateral bruits.  There is no lymphadenopathy or thyromegaly. CARDIAC:  PMI is nondisplaced.  He is irregular and tachycardic. LUNGS:  Clear with decreased breath sounds at the bases. ABDOMEN:  Soft, nontender, nondistended. EXTREMITIES:  Warm with no cyanosis, clubbing or edema.  No  rash. NEURO:  He is somewhat confused, but can follow some basic commands.  LABORATORY DATA:  EKG on admission shows atrial fibrillation with slow ventricular response at a rate of 50.  Currently telemetry shows atrial fibrillation with rapid ventricular response of about 115.  Sodium 141, potassium 3.3, BUN of 5, creatinine 0.70, hemoglobin 16.3, hematocrit is 45%.  ASSESSMENT: 1. Chronic atrial fibrillation, now with rapid ventricular response. 2. Left hip fracture. 3. Nonobstructive coronary artery disease, on cardiac catheterization     in 2006. 4. Peripheral arterial disease with known 4 cm abdominal aortic     aneurysm, which is stable. 5. Recurrent falls. 6. History of cerebrovascular accident.  PLAN/DISCUSSION:  Given his age and medical history, Mr. Harr is likely at moderate risk for perioperative cardiac complications, though there is little we can do to mitigate this risk.  At this point, I would recommend proceeding surgery without further cardiac workup.  I would start IV diltiazem to manage with atrial fibrillation with lot of ventricular response in the perioperative period.  We would continue his beta-blocker.  Given his history of recurrent falls with multiple fractures, we may have to reconsider the risk benefit ratio of long-term anticoagulation for him.  We appreciate the consult.     Bevelyn Buckles. Ladawn Boullion, MD     DRB/MEDQ  D:  08/29/2010  T:  08/29/2010  Job:  [id number]  cc:   Harvie Junior, M.D. Madolyn Frieze Jens Som, MD, [facility]  Electronically Signed by Arvilla Meres MD on 09/20/2010 03:27:14 PM

## 2010-09-21 LAB — CBC
HCT: 27 % — ABNORMAL LOW (ref 39.0–52.0)
HCT: 29 % — ABNORMAL LOW (ref 39.0–52.0)
Hemoglobin: 8.7 g/dL — ABNORMAL LOW (ref 13.0–17.0)
Hemoglobin: 9.1 g/dL — ABNORMAL LOW (ref 13.0–17.0)
MCV: 94.2 fL (ref 78.0–100.0)
RBC: 2.91 MIL/uL — ABNORMAL LOW (ref 4.22–5.81)
RBC: 3.08 MIL/uL — ABNORMAL LOW (ref 4.22–5.81)
WBC: 8.2 10*3/uL (ref 4.0–10.5)
WBC: 8.3 10*3/uL (ref 4.0–10.5)

## 2010-09-21 NOTE — Discharge Summary (Signed)
NAMECHETT, Martin NO.:  [id number]  MEDICAL RECORD NO.:  [id number]  LOCATION:  2007                         FACILITY:  [facility]  PHYSICIAN:  Sundra Aland, MD      DATE OF BIRTH:  07/15/1923  DATE OF ADMISSION:  09/15/2010 DATE OF DISCHARGE:  09/20/2010                              DISCHARGE SUMMARY   DISCHARGE DISPOSITION:  Home.  DISCHARGE DIAGNOSES: 1. Upper GI bleed most likely secondary to Coumadin toxicity with INR     of greater than 4, status post reversal with FFP and vitamin K. 2. Acute blood loss anemia secondary to GI bleed status post blood     transfusion. 3. Persistent hypokalemia being corrected. 4. History of atrial fibrillation with controlled ventricular rate on     multiple nodals including digoxin, Lopressor and Cardizem. 5. Full code.  DISCHARGE MEDICATIONS: 1. Pravastatin 40 mg p.o. at bedtime. 2. Metoprolol tartrate 50 mg p.o. b.i.d. 3. Klor-Con 20 mEq daily. 4. Hydrocodone/APAP 5/325 one to two tablets every 4 hours as needed     for pain. 5. Lasix 40 mg half a tablet by mouth daily. 6. Diltiazem 180 mg p.o. daily. 7. Digoxin 0.125 mg daily. 8. Diclofenac gel 3% gel to apply to knee as needed for pain. 9. Aspirin 81 mg daily. 10.Senna 2 tablets by mouth as needed for constipation. 11.Supplements Ensure with meals b.i.d. as well as a food thickener.  PROCEDURES: 1. Fluoroscopic swallow eval. 2. Chest x-ray on the Martin of admission, August 29, 2010, showed     __________ cardiomegaly and mild changes of COPD with addition of     previous pulmonary edema.  Ancillary services including PT, OT and     ST.  HOSPITAL COURSE:  Mr. [name] is an 77 year old a Caucasian male who lives with the family and has a history of atrial fibrillation that is well-controlled of multiple nodal agents, and also on Coumadin for secondary stroke prevention.  He also has a hypertension and hyperlipidemia as well as dementia.  The patient was  brought into the hospital because of black tarry stool as well as vomiting.  GI bleed was suspected.  In his workup in the ER, the patient was noted to have hemoglobin of 7.3.  this is a significant drop from his baseline level of 11.8 on September 04, 2010.  He was also found to have an elevated INR of 4.24.  secondary to Coumadin toxicity was suspected.  The patient received FFP 2 units as well as vitamin K for reversal.  The INR is down to 1.16.  The patient also received 2 units of packed red blood cell transfusion, dropping the hemoglobin from 7.3 to 9.3, and hematocrit of 28.1.  In spite of multiple replacement, the patient still has elevated low potassium.  While in house, the patient remained confused and sometimes combative but occasionally he is very calm.  Sometimes, he did respond to use of Haldol.  __________  evaluation by __________ ancillary services including PT and OT as well as ST.  The patient's daughter insisted that she would like to take the father home.  It was therefore recommended, the patient could  go home with home health OT, PT, as well as ST.  Today his vital signs are stable.  Blood pressure is 110/76, heart rate 82, respirations 16, temperature  97.7, saturating 94% on room air.  The only drawback is his potassium that __________ almost about 80 mEq  of baseline 2.5; however, we are __________.  When it has appreciably been controlled and at time we will discharge the patient home.  I will most likely sent home on increased daily dose of his potassium was to be followed by his family doctor.  DISCHARGE DISPOSITION:  To home with home health PT, OT as well as ST including RN and NA.  DISCHARGE ACTIVITY:  The patient will continue with home health PT.  DISCHARGE FOLLOWUP:  The patient needs to follow with his primary care physician Dr. Eleonore Chiquito in 1-2 weeks.  DISCHARGE DIET:  Heart healthy/cardiac prudent.  Prior to further dictation I am going to  speak to the patient and daughter the disposition plan.  She seems to be aware that the father is sometimes combative.  Also the family wanted the patient to remain full code.     Sundra Aland, MD     LA/MEDQ  D:  09/20/2010  T:  09/21/2010  Job:  [id number]  Electronically Signed by Sundra Aland MD on 09/21/2010 03:30:02 PM

## 2010-09-22 NOTE — Discharge Summary (Signed)
  NAMEBILLAL, ROLLO NO.:  [id number]  MEDICAL RECORD NO.:  [id number]  LOCATION:  2007                         FACILITY:  [facility]  PHYSICIAN:  Sundra Aland, MD      DATE OF BIRTH:  08/18/1923  DATE OF ADMISSION:  09/15/2010 DATE OF DISCHARGE:  09/21/2010                              DISCHARGE SUMMARY   This morning, his  potassium is 3.3.  I will go ahead and give him 60 mEq of KCl elixir but by Monday,  they need to have his potassium checked by Dr. __________.  In addition, patient is currently on 20 mEq of potassium daily which is incorrect regimen.  Vital signs are stable this morning.  Blood pressure 130/76, heart rate is 80, respirations 18, temperature 97.8, O2 saturation 97% on 2 L of nasal cannula oxygen.  The patient was discharged in stable clinical condition.  He will continue with PT, OT, ST at home.     Sundra Aland, MD     LA/MEDQ  D:  09/21/2010  T:  09/21/2010  Job:  [id number]  Electronically Signed by Sundra Aland MD on 09/22/2010 05:53:00 PM

## 2010-09-24 ENCOUNTER — Emergency Department (HOSPITAL_COMMUNITY): Payer: Medicare Other

## 2010-09-24 ENCOUNTER — Inpatient Hospital Stay (HOSPITAL_COMMUNITY)
Admission: EM | Admit: 2010-09-24 | Discharge: 2010-09-27 | DRG: 177 | Disposition: A | Discharge status: Home Health | Payer: Medicare Other | Attending: Internal Medicine | Admitting: Internal Medicine

## 2010-09-24 DIAGNOSIS — J9610 Chronic respiratory failure, unspecified whether with hypoxia or hypercapnia: Secondary | ICD-10-CM | POA: Diagnosis present

## 2010-09-24 DIAGNOSIS — Z7982 Long term (current) use of aspirin: Secondary | ICD-10-CM

## 2010-09-24 DIAGNOSIS — E876 Hypokalemia: Secondary | ICD-10-CM | POA: Diagnosis present

## 2010-09-24 DIAGNOSIS — F329 Major depressive disorder, single episode, unspecified: Secondary | ICD-10-CM | POA: Diagnosis present

## 2010-09-24 DIAGNOSIS — B952 Enterococcus as the cause of diseases classified elsewhere: Secondary | ICD-10-CM | POA: Diagnosis present

## 2010-09-24 DIAGNOSIS — J690 Pneumonitis due to inhalation of food and vomit: Principal | ICD-10-CM | POA: Diagnosis present

## 2010-09-24 DIAGNOSIS — D649 Anemia, unspecified: Secondary | ICD-10-CM | POA: Diagnosis present

## 2010-09-24 DIAGNOSIS — E860 Dehydration: Secondary | ICD-10-CM | POA: Diagnosis present

## 2010-09-24 DIAGNOSIS — E43 Unspecified severe protein-calorie malnutrition: Secondary | ICD-10-CM | POA: Diagnosis present

## 2010-09-24 DIAGNOSIS — N390 Urinary tract infection, site not specified: Secondary | ICD-10-CM | POA: Diagnosis present

## 2010-09-24 DIAGNOSIS — Z8546 Personal history of malignant neoplasm of prostate: Secondary | ICD-10-CM

## 2010-09-24 DIAGNOSIS — R1310 Dysphagia, unspecified: Secondary | ICD-10-CM | POA: Diagnosis present

## 2010-09-24 DIAGNOSIS — I4891 Unspecified atrial fibrillation: Secondary | ICD-10-CM | POA: Diagnosis present

## 2010-09-24 DIAGNOSIS — I714 Abdominal aortic aneurysm, without rupture, unspecified: Secondary | ICD-10-CM | POA: Diagnosis present

## 2010-09-24 DIAGNOSIS — F3289 Other specified depressive episodes: Secondary | ICD-10-CM | POA: Diagnosis present

## 2010-09-24 DIAGNOSIS — I10 Essential (primary) hypertension: Secondary | ICD-10-CM | POA: Diagnosis present

## 2010-09-24 DIAGNOSIS — Z87891 Personal history of nicotine dependence: Secondary | ICD-10-CM

## 2010-09-24 DIAGNOSIS — E785 Hyperlipidemia, unspecified: Secondary | ICD-10-CM | POA: Diagnosis present

## 2010-09-24 DIAGNOSIS — F068 Other specified mental disorders due to known physiological condition: Secondary | ICD-10-CM | POA: Diagnosis present

## 2010-09-24 LAB — CK TOTAL AND CKMB (NOT AT ARMC): Total CK: 17 U/L (ref 7–232)

## 2010-09-24 LAB — DIFFERENTIAL
Basophils Absolute: 0 10*3/uL (ref 0.0–0.1)
Basophils Relative: 0 % (ref 0–1)
Eosinophils Absolute: 0.4 10*3/uL (ref 0.0–0.7)
Lymphs Abs: 1.6 10*3/uL (ref 0.7–4.0)
Monocytes Absolute: 0.9 10*3/uL (ref 0.1–1.0)
Neutro Abs: 7 10*3/uL (ref 1.7–7.7)

## 2010-09-24 LAB — URINALYSIS, ROUTINE W REFLEX MICROSCOPIC
Glucose, UA: NEGATIVE mg/dL
Ketones, ur: NEGATIVE mg/dL
Nitrite: NEGATIVE
Protein, ur: NEGATIVE mg/dL
pH: 6.5 (ref 5.0–8.0)

## 2010-09-24 LAB — TROPONIN I
Troponin I: <0.3 ng/mL (ref <0.30)
Troponin I: <0.3 ng/mL (ref <0.30)

## 2010-09-24 LAB — POCT I-STAT, CHEM 8
BUN: 4 mg/dL — ABNORMAL LOW (ref 6–23)
Calcium, Ion: 0.98 mmol/L — ABNORMAL LOW (ref 1.12–1.32)
Chloride: 99 mEq/L (ref 96–112)
HCT: 34 % — ABNORMAL LOW (ref 39.0–52.0)
Sodium: 141 mEq/L (ref 135–145)
TCO2: 31 mmol/L (ref 0–100)

## 2010-09-24 LAB — CBC
HCT: 32.8 % — ABNORMAL LOW (ref 39.0–52.0)
MCH: 29.9 pg (ref 26.0–34.0)
MCV: 92.4 fL (ref 78.0–100.0)
Platelets: ADEQUATE 10*3/uL (ref 150–400)
RDW: 17.4 % — ABNORMAL HIGH (ref 11.5–15.5)

## 2010-09-24 LAB — URINE MICROSCOPIC-ADD ON

## 2010-09-24 LAB — DIGOXIN LEVEL: Digoxin Level: 0.6 ng/mL — ABNORMAL LOW (ref 0.8–2.0)

## 2010-09-25 ENCOUNTER — Ambulatory Visit: Payer: Medicare Other | Admitting: Internal Medicine

## 2010-09-25 DIAGNOSIS — I4891 Unspecified atrial fibrillation: Secondary | ICD-10-CM

## 2010-09-25 LAB — BASIC METABOLIC PANEL
Calcium: 8.3 mg/dL — ABNORMAL LOW (ref 8.4–10.5)
GFR calc Af Amer: >60 mL/min (ref >60)
GFR calc non Af Amer: >60 mL/min (ref >60)
Glucose, Bld: 122 mg/dL — ABNORMAL HIGH (ref 70–99)
Potassium: 2.6 mEq/L — CL (ref 3.5–5.1)
Sodium: 142 mEq/L (ref 135–145)

## 2010-09-25 LAB — CARDIAC PANEL(CRET KIN+CKTOT+MB+TROPI)
CK, MB: 2.1 ng/mL (ref 0.3–4.0)
CK, MB: 2 ng/mL (ref 0.3–4.0)
Relative Index: INVALID (ref 0.0–2.5)
Total CK: 18 U/L (ref 7–232)
Total CK: 13 U/L (ref 7–232)
Troponin I: <0.3 ng/mL (ref <0.30)

## 2010-09-25 LAB — CBC
HCT: 31.1 % — ABNORMAL LOW (ref 39.0–52.0)
Hemoglobin: 9.8 g/dL — ABNORMAL LOW (ref 13.0–17.0)
RBC: 3.38 MIL/uL — ABNORMAL LOW (ref 4.22–5.81)
WBC: 9.6 10*3/uL (ref 4.0–10.5)

## 2010-09-25 LAB — TSH: TSH: 1.749 u[IU]/mL (ref 0.350–4.500)

## 2010-09-26 LAB — CBC
Hemoglobin: 9.7 g/dL — ABNORMAL LOW (ref 13.0–17.0)
MCHC: 31.3 g/dL (ref 30.0–36.0)
RDW: 17.1 % — ABNORMAL HIGH (ref 11.5–15.5)
WBC: 10.4 10*3/uL (ref 4.0–10.5)

## 2010-09-26 LAB — BASIC METABOLIC PANEL
GFR calc Af Amer: >60 mL/min (ref >60)
GFR calc non Af Amer: >60 mL/min (ref >60)
Glucose, Bld: 111 mg/dL — ABNORMAL HIGH (ref 70–99)
Potassium: 2.3 mEq/L — CL (ref 3.5–5.1)
Sodium: 142 mEq/L (ref 135–145)

## 2010-09-26 LAB — DIFFERENTIAL
Basophils Absolute: 0 10*3/uL (ref 0.0–0.1)
Basophils Relative: 0 % (ref 0–1)
Monocytes Absolute: 1.1 10*3/uL — ABNORMAL HIGH (ref 0.1–1.0)
Neutro Abs: 7.7 10*3/uL (ref 1.7–7.7)

## 2010-09-27 ENCOUNTER — Inpatient Hospital Stay (HOSPITAL_COMMUNITY): Payer: Medicare Other

## 2010-09-27 LAB — BASIC METABOLIC PANEL
Calcium: 8.4 mg/dL (ref 8.4–10.5)
GFR calc non Af Amer: >60 mL/min (ref >60)
Glucose, Bld: 133 mg/dL — ABNORMAL HIGH (ref 70–99)
Potassium: 5 mEq/L (ref 3.5–5.1)
Sodium: 139 mEq/L (ref 135–145)

## 2010-09-27 LAB — URINE CULTURE

## 2010-09-27 NOTE — Consult Note (Signed)
[name], STEEVES NO.:  [id number]  MEDICAL RECORD NO.:  [id number]  LOCATION:  4708                         FACILITY:  [facility]  PHYSICIAN:  Noralyn Pick. [name], MD, FACCDATE OF BIRTH:  09-29-1923  DATE OF CONSULTATION:  09/25/2010 DATE OF DISCHARGE:                                CONSULTATION   An 77 year old patient we are asked to see by the hospitalist service for AFib.  The patient is a very debilitated 77 year old patient who has been in the hospital quite a bit in 2012.  He has fallen multiple times and has had a pelvic and hip fracture.  He is followed by Dr. Jens Som. He has a history of hypertension, hyperlipidemia, and chronic AFib.  He has had a previous flutter ablation.  He has vascular disease with a known AAA that has been stable at 4 cm. He had stenting of the left renal artery in 2007.  He was admitted to the hospital with malaise, weakness, pneumonia, and UTI.  There is a question in the admission H and P of some vague chest pain.  He denies this to me.  He has had a cath, I believe back in 2006 with no coronary artery disease.  His AFib is chronic and well rate controlled on Cardizem, beta-blockers and digoxin.  He has had an echo in February which showed normal LV function and only mild MR.  In the hospital, he continues to have confusion on top of his baseline dementia.  He is weak.  Swallowing study shows some reflux and need for soft solid diet.  He will likely need to go to a skilled nursing facility.  Apparently, he is a full code.  Currently, he is resting comfortably.  He denies chest pain, shortness of breath, PND, orthopnea, or palpitations.  Review of his telemetry shows atrial fibrillation with good rate control and no long pauses.  His cardiac markers were negative.  EKG shows atrial fibrillation with poor R-wave progression, digoxin effect, and nonspecific ST-T wave changes.  I do not think any further cardiac workup is  indicated.  His 10-point review of systems is otherwise negative.  PAST MEDICAL HISTORY:  Remarkable for nonobstructive coronary artery in 2006; chronic AFib with a previous flutter ablation not a Coumadin candidate due to frequent falls and dementia as well as GI bleeding; history of bilateral renal artery stenosis with stenting of the left renal artery in 2007; history of CVA; history of infrarenal abdominal aneurysm, stable at 4 cm; history of ventilatory dependent respiratory failure in 2012; hyperlipidemia; and depression.  Prior to admission, he was on Voltaren, digoxin 0.125 a day, Cardizem 180 a day, metoprolol 50 b.i.d., potassium 20 a day, Crestor 5 a day, Zoloft 25 a day, and morphine.  He has no known allergies.  He lives with his daughter and son-in-law.  He is a previous smoker, quit 10 years ago.  He has been very debilitated since his pelvic fracture earlier this year and is not getting around well.  He has difficulty swallowing with chronic dysphagia and is at aspiration risk. He will likely need to go to a skilled nursing facility.  PHYSICAL EXAMINATION:  GENERAL:  Somewhat lethargic, elderly, frail male in no distress. VITAL SIGNS:  Blood pressure of 140/67; pulse 75 and irregular, in AFib; afebrile; saturations 96% on 2 liters. HEENT:  Unremarkable. NECK:  Carotids have faint left bruit.  No JVP elevation, lymphadenopathy, or thyromegaly. LUNGS:  Inspiratory crackles at the base. CARDIAC:  There is an S1 and S2 with a soft systolic murmur.  PMI is normal. ABDOMEN:  Benign.  He has a palpable abdominal aorta which is not tender.  No hepatosplenomegaly or hepatojugular reflux. EXTREMITIES:  Distal pulses are intact.  No edema. NEUROLOGIC:  Nonfocal, although lethargic.  Lower extremities are weak. SKIN:  Warm and dry.  LABORATORY DATA:  Lab work is remarkable for negative cardiac enzymes x2.  Potassium 2.6 being replaced.  BUN 5, creatinine 0.53.   Hematocrit 31.1 and platelets 306.  TSH is 1.7.  Digoxin level was 0.6.  EKG shows atrial fibrillation with poor R-wave progression, nonspecific ST-T wave changes.  Chest x-ray shows old granulomatous disease with increased density at the left lung base most compatible with atelectasis or pneumonia, also possible aspiration.  IMPRESSION: 1. Elderly debilitated male with chronic atrial fibrillation, good     rate control.  Given his age and debilitation and normal left     ventricular function, we will stop his digoxin and treat him with     just Cardizem and Lopressor.  He is not a candidate for Coumadin     due to his frequent falls and gastrointestinal bleeding. 2. Question chest pain.  No longer complaining of it.  Negative     enzymes.  No coronary artery disease by cath in 2006.  No     indication for further workup. 3. Pneumonia and urinary tract infection.  Continue current     antibiotics.  The patient does not appear septic. 4. Chronic dysphagia with aspiration risk and abnormal chest x-ray.     We would get dietary consultation and likely need soft solid diet. 5. Placement.  Apparently, the patient is a full code.  I suspect that     given his overall decline since February, the primary service     should talk to his family about DNR status.  He will likely need to     go to a skilled nursing facility.  No further cardiac workup is indicated at this time.  The only changes that will be made will be to stop his digoxin since he has normal LV function and I am somewhat leery of having an elderly frail man on 3 AV nodal blocking drugs.     Noralyn Pick. [name], MD, [facility]     PCN/MEDQ  D:  09/25/2010  T:  09/26/2010  Job:  [id number]  Electronically Signed by Charlton Haws MD [facility] on 09/27/2010 10:45:47 PM

## 2010-10-04 NOTE — Discharge Summary (Signed)
[name], [name] NO.:  [id number]  MEDICAL RECORD NO.:  [id number]  LOCATION:  4708                         FACILITY:  [facility]  PHYSICIAN:  [name], MDDATE OF BIRTH:  [birth year]  DATE OF ADMISSION:  09/24/2010 DATE OF DISCHARGE:  09/27/2010                              DISCHARGE SUMMARY   DISCHARGE DISPOSITION:  Home.  FINAL DISCHARGE DIAGNOSES: 1. Aspiration pneumonia versus pneumonitis. 2. Enterococcal urinary tract infection. 3. Atrial fibrillation, heart rate controlled, not a Coumadin     candidate secondary to gastrointestinal bleeds and falls. 4. Abdominal aortic aneurysm. 5. Dementia. 6. Status post left femoral neck fracture. 7. Impaired fasting glucose. 8. Chronic aspiration. 9. History of prostate cancer. 10.Hypokalemia. 11.Chronic hypoxic respiratory failure. 12.History of cerebrovascular accident. 13.History of aspiration with ventilator-dependent respiratory failure     in February 2012.  DISCHARGE MEDICATIONS: 1. Levaquin 500 mg p.o. x4 days. 2. Aspirin enteric-coated 81 mg p.o. daily. 3. Diclofenac 3% topical gel applied daily as needed for the knee. 4. Diltiazem 180 mg p.o. daily. 5. Lasix 20 mg p.o. daily. 6. Hydrocodone/APAP 5/325 one to two tablets p.o. q.4 h. p.r.n. pain. 7. Klor-Con 40 mEq p.o. daily. 8. Metoprolol 50 mg p.o. b.i.d. 9. Pravastatin 40 mg p.o. at bedtime. 10.Ensure 1 can p.o. b.i.d.  CONSULTATIONS: 1. [name]. Martin Emms, MD, [facility], Cardiology 2. Martin Broom, MD, Orthopedic Surgery  PROCEDURES:  None.  DIAGNOSTIC STUDIES: 1. CT of head without contrast which shows:     a.     No evidence of acute intracranial abnormality.     b.     Atrophy with small-vessel ischemic changes. 2. Two-view chest x-ray which shows chronic changes of the chest.     There is old granulomatous disease, increased density in the left     lung base, most compatible with airspace disease or atelectasis.     This may  represent aspiration pneumonitis or asymmetrical or     atypical pulmonary edema or pneumonia. 3. X-ray of the left femur which shows stable appearance of the left     femur status post recent ORIF.  PRIMARY CARE PHYSICIAN:  Martin Savers, MD  PRIMARY CARDIOLOGIST:  [name]. Martin Som, MD, [facility]  CODE STATUS:  Full code.  ALLERGIES:  No known drug allergies.  CHIEF COMPLAINT:  Weakness, pneumonia, and urinary tract infection.  HISTORY OF PRESENT ILLNESS:  Please refer to the H and P by [name] for details of the HPI.  However, in short, this is an 77 year old gentleman who has had a very difficult course since February who was admitted in February secondary to a fall where he had an inferior pubic ramus fracture.  The patient developed ventilator-dependent respiratory failure and aspiration pneumonia.  The patient has had multiple hospitalizations since then.  The most recent being the hospitalization after he had a hip fracture repair on August 30, 2010.  The patient was discharged on September 04, 2010 and readmitted on September 16, 2010 with a GI bleed.  He was discharged on September 22, 2010 and readmitted on September 24, 2010 for pneumonia urinary tract infection.  HOSPITAL COURSE: 1. Pneumonia.  The patient  had presented with hypoxemia and a chest x-     ray showed pneumonia versus pneumonitis.  The patient was treated     with vancomycin and Zosyn for hospital-acquired processes.  The     patient had recently been discharged from the hospital.  This     patient does have a dysphagia and is at risk for aspiration which     he has demonstrated.  The patient also has dementia which puts him     at even greater risk, in addition to his weakness putting him at     risk for aspiration.  The patient was treated with vancomycin and     Zosyn for a total of 2 days.  He has had no fevers.  He has had     very little cough.  I suspect that this is more of a pneumonitis     and it is a pneumonia and  the patient will be discharged on     Levaquin 500 mg for a total of 5 days which would cover both, the     respiratory process as well as the urinary tract infection.  The     patient's daughter, Ms. [name], was very concerned about him     aspirating after having been on the ventilator in February.  I have     explained to her to the risk factors for aspiration and that her     father, [name], has multiple risk factors.  I have also     explained to her that the expectation is that he will continue to     aspirate.  Whether it is subsequent pneumonia or just a pneumonitis     or be asymptomatic, we are unable to tell her at this time. 2. Enterococcus urinary tract infection.  The patient was found to     have a urinary tract infection and urine culture proved it to be     enterococcus sensitive to Levaquin and ampicillin.  The patient had     been on Zosyn for 3 days and will continue with Levaquin which will     treat the urinary tract infection. 3. Atrial fibrillation with bradycardia.  The patient was seen by     Cardiology, Dr. Eden [name], who recommended the patient's digoxin be     discontinued.  He also recommended stopping the Lasix at this time.     The patient has been without digoxin and Lasix during this     hospitalization and has been doing quite well.  Based upon the     patient's most recent echocardiogram in February 2012, the patient     has normal systolic function, however, diastolic function is     indeterminate.  I will defer to the cardiologist and the primary     care physician as to whether or not the Lasix should be restarted     once the patient is in the convalescent stage. 4. Mild dehydration.  The patient was mildly dehydrated on admission.     He was given gentle IV fluid resuscitation and the Lasix was held,     please see above. 5. Abdominal aortic aneurysm.  The patient will continue to have his     abdominal aortic aneurysm monitored by the Cardiology  office. 6. Status post left femoral hip fracture with ORIF.  The patient had     staples in place.  [name] was consulted who came in and  removed his staples.  He ordered an x-ray on August 28, 2010 which     shows no distraction of the ORIF.  The patient should follow up     with [name] as previously instructed.  Otherwise, the patient     has been stable in the hospital.  PHYSICAL EXAMINATION:  Today, his physical examination is as follows. GENERAL:  The patient is well-appearing. VITAL SIGNS:  His temperature is 98.7, heart rate 79, blood pressure 160/74, respiratory rate 18, AND O2 sats are 93% on room air. HEENT:  Normocephalic and atraumatic.  Pupils are equally round and reactive to light and accommodation.  Extraocular movements are intact. Oropharynx is moist.  No exudate, erythema, or lesions are noted. NECK:  Trachea is midline.  No masses, no thyromegaly, no JVD, and no carotid bruit. RESPIRATORY:  He has some normal respiratory effort.  Equal excursion bilaterally.  No wheezing or rhonchi noted. CARDIOVASCULAR:  He has got a normal S1-S2.  No murmurs, rubs, or gallops noted. ABDOMEN:  Abdomen is obese, soft, nontender, and nondistended.  No masses and no hepatosplenomegaly. EXTREMITIES: No clubbing, cyanosis, or edema. PSYCHIATRIC:  The patient is alert and oriented to time, person, and place.  However, the patient does had periods where he mixes up information as discussed with his daughter.  DIETARY RESTRICTIONS:  The patient should be on a heart-healthy diet.  PHYSICAL RESTRICTIONS:  Weightbearing as tolerated.  FOLLOWUP:  The patient is to follow up with Dr. Amador Cunas within a week.  He is to follow up with Dr. Jens [name] of Cardiology also within a week and with Dr. Imogene Burn as previously scheduled.  TIME SPENT:  Total time for this discharge process including face-to- face time is approximately 40 minutes.     Martin Harm,  MD     MAM/MEDQ  D:  09/27/2010  T:  09/28/2010  Job:  [id number]  cc:   Martin Broom, MD [name]. Martin Emms, MD, [facility] Martin Savers, MD [name]. Martin Som, MD, [facility]  Electronically Signed by Marthann Schiller MD on 10/04/2010 12:37:57 PM

## 2010-10-09 ENCOUNTER — Ambulatory Visit: Payer: Medicare Other | Admitting: Internal Medicine

## 2010-10-09 ENCOUNTER — Ambulatory Visit (INDEPENDENT_AMBULATORY_CARE_PROVIDER_SITE_OTHER): Payer: Medicare Other | Admitting: Cardiology

## 2010-10-09 ENCOUNTER — Encounter: Payer: Self-pay | Admitting: Cardiology

## 2010-10-09 DIAGNOSIS — I701 Atherosclerosis of renal artery: Secondary | ICD-10-CM

## 2010-10-09 DIAGNOSIS — I714 Abdominal aortic aneurysm, without rupture: Secondary | ICD-10-CM

## 2010-10-09 DIAGNOSIS — E7800 Pure hypercholesterolemia, unspecified: Secondary | ICD-10-CM

## 2010-10-09 DIAGNOSIS — I4891 Unspecified atrial fibrillation: Secondary | ICD-10-CM

## 2010-10-09 DIAGNOSIS — I10 Essential (primary) hypertension: Secondary | ICD-10-CM

## 2010-10-09 DIAGNOSIS — I6529 Occlusion and stenosis of unspecified carotid artery: Secondary | ICD-10-CM

## 2010-10-09 NOTE — Progress Notes (Signed)
HPI:[name] is a pleasant gentleman with hypertension, history of atrial flutter ablation, and vascular disease. He also has atrial fibrillation that we are treating with rate control and anticoagulation. Previous cardiac catheterization in October 2006 showed nonobstructive coronary disease and an ejection fraction of 55%. There was a 90% left and 75% right renal artery stenosis. He had previous stenting of his left renal artery in October of 2007. Last echocardiogram in Feb 2012 showed nl LV function. There was mild mitral regurgitation. The left atrium was moderately dilated. The right was markedly dilated. The right ventricle was mildly dilated. Carotid Dopplers in February of 2012 showed 0-39% stenosis bilaterally and F/U recommended in 2 years. Last abdominal ultrasound in Feb 2012 showed AAA of 3.8 x 3.8 cm and 1-59% bilateral RAS. He has had multiple recent admissions. One occurred after falling and suffering a femur fracture. He also had a GI bleed. He also was admitted for pneumonia. The patient denies any dyspnea on exertion, orthopnea, PND, pedal edema, palpitations, syncope or chest pain. Remains frail and unsteady on his feet.    Current Outpatient Prescriptions  Medication Sig Dispense Refill  . aspirin 81 MG tablet Take 81 mg by mouth daily.        . Diclofenac Sodium 3 % GEL Place 1 application onto the skin 2 (two) times daily.  12 Tube  6  . diltiazem (CARDIZEM CD) 180 MG 24 hr capsule Take 1 capsule (180 mg total) by mouth daily.  30 capsule  12  . metoprolol (LOPRESSOR) 50 MG tablet Take 1 tablet (50 mg total) by mouth 2 (two) times daily.  180 tablet  2  . potassium chloride SA (K-DUR,KLOR-CON) 20 MEQ tablet Take 20 mEq by mouth 2 (two) times daily.       . pravastatin (PRAVACHOL) 40 MG tablet Take 40 mg by mouth daily.           Past Medical History  Diagnosis Date  . Atrial fibrillation   . AAA (abdominal aortic aneurysm)   . Hypertension   . Carotid stenosis   .  Hypercholesteremia   . Cardiomyopathy     improved  . Renal artery stenosis   . COPD (chronic obstructive pulmonary disease)   . Osteoarthritis   . Prostate cancer   . Atrial flutter     s/p ablation  . Hip fracture     Past Surgical History  Procedure Date  . Appendectomy   . Cholecystectomy   . Tonsillectomy     History   Social History  . Marital Status: Widowed    Spouse Name: N/A    Number of Children: N/A  . Years of Education: N/A   Occupational History  . Not on file.   Social History Main Topics  . Smoking status: Former Games developer  . Smokeless tobacco: Not on file  . Alcohol Use: No  . Drug Use: No  . Sexually Active: Not on file   Other Topics Concern  . Not on file   Social History Narrative  . No narrative on file    ROS: no fevers or chills, productive cough, hemoptysis, dysphasia, odynophagia, melena, hematochezia, dysuria, hematuria, rash, seizure activity, orthopnea, PND, pedal edema, claudication. Remaining systems are negative.  Physical Exam: Well-developed frail in no acute distress.  Skin is warm and dry.  HEENT is normal.  Neck is supple. No thyromegaly.  Chest is clear to auscultation with normal expansion.  Cardiovascular exam is irregular Abdominal exam nontender or distended. No  masses palpated. Extremities show no edema. neuro grossly intact  ECG atrial fibrillation at a rate of 93. Axis normal. Cannot rule out prior septal infarct.

## 2010-10-09 NOTE — Assessment & Plan Note (Signed)
Blood pressure controlled. Continue present medications. 

## 2010-10-09 NOTE — Assessment & Plan Note (Signed)
Continue aspirin and statin. 

## 2010-10-09 NOTE — Assessment & Plan Note (Signed)
followup abdominal ultrasound February 2013.

## 2010-10-09 NOTE — Assessment & Plan Note (Signed)
Continue present medications. Followup renal Dopplers February 2012.

## 2010-10-09 NOTE — Patient Instructions (Signed)
Your physician recommends that you schedule a follow-up appointment in 3 MONTHS. 

## 2010-10-09 NOTE — Assessment & Plan Note (Signed)
Continue statin. Lipids and liver monitored by primary care. 

## 2010-10-09 NOTE — Assessment & Plan Note (Signed)
Patient remains in atrial fibrillation. Continue Cardizem and beta blocker for rate control. Continue aspirin. Not a Coumadin candidate given  recurrent falls and recent GI bleed.

## 2010-10-09 NOTE — Op Note (Signed)
[name], [name] NO.:  [id number]  MEDICAL RECORD NO.:  [id number]  LOCATION:  3304                         FACILITY:  [facility]  PHYSICIAN:  Jones Broom, MD    DATE OF BIRTH:  1923-09-03  DATE OF PROCEDURE:  08/30/2010 DATE OF DISCHARGE:                              OPERATIVE REPORT   PREOPERATIVE DIAGNOSIS:  Left three-part intertrochanteric proximal femur fracture.  POSTOPERATIVE DIAGNOSIS:  Left three-part intertrochanteric proximal femur fracture.  PROCEDURE PERFORMED:  Intramedullary nail fixation for left intertrochanteric proximal femur fracture.  ATTENDING SURGEON:  Berline Lopes, MD  ASSISTANT:  None.  ANESTHESIA:  GETA.  COMPLICATIONS:  None.  DRAINS:  None.  SPECIMENS:  None.  ESTIMATED BLOOD LOSS:  100 mL.  INDICATIONS FOR SURGERY:  The patient is an 77 year old gentleman with a mechanical fall on the left side suffering a left hip fracture.  He is indicated for operative treatments to promote early ambulation and prevent complications of bedrest as well as for pain control.  He understood risks, benefits and alternatives of procedure and wished to go forward with surgery.  OPERATIVE FINDINGS:  DePuy AFFIXUS 400-mm nail with a 115-mm lag screw was used with anatomic reduction of the fracture.  PROCEDURE:  The patient was identified in the preoperative holding area where I personally marked the operative site after verifying site, side and procedure with the patient.  He was taken back to the operating room where general anesthesia was induced without complication.  He was transferred to the fracture table with the left lower extremity was placed in traction.  The right lower extremity was placed in abducted flexed position.  The reduction was obtained with some traction and internal rotation verified with fluoroscopic imaging, AP and lateral planes and the left lower extremity was then prepped and draped in a standard  sterile fashion.  Appropriate time-out of  procedure was carried out.  The patient received IV antibiotics prior to the procedure.  An approximately 3-cm incision was made just proximal to the tip of the greater trochanter.  Dissection was carried down through the fascia to the tip of the greater trochanter and the guide pin was placed under fluoroscopic imaging.  The entry reamer was then used to open the canal.  An 11 x 400 mm nail was advanced past the fracture site down to the knee.  The fracture reduction was maintained nicely.  The regular sleeve was then used to place a small incision one and a half cm lateral thigh to allow the sleeve to advance down to the lateral femur.  The guide pin was placed in the center of the femoral head on AP and lateral views and measured for appropriate size.  The reamer was then used over ream the guide pin and a 115-mm lag screw was then advanced over the guide pin into the center-center point of the femoral head.  The set screw was advanced proximally and then backed off cord turned to allow some compression.  It was felt that no distal interlocking screw would be necessary in this case.  Final fluoroscopic imaging in AP and lateral planes at the hip and knee demonstrated appropriate  positioning of the implant at length.  The wound was then copiously irrigated with normal saline and closed in layers with #1 Vicryl in a deep fascial layer, 2-0 Vicryl in dermal layer and staples for skin closure.  Sterile dressings were then applied including a 4x4s and Mepilex dressings.  The patient was allowed to awaken from general anesthesia, was taken out of the traction configuration, placed on stretcher and taken to the recovery room in stable condition.  POSTOPERATIVE PLAN:  He will be weightbearing as tolerated.  He will restart his Coumadin postoperatively and return to the Internal Medicine Service and will be discharged to a skilled nursing facility  when he is medically stable.     Jones Broom, MD     JC/MEDQ  D:  08/30/2010  T:  08/31/2010  Job:  [id number]  Electronically Signed by Jones Broom  on 10/09/2010 03:55:48 PM

## 2010-10-12 ENCOUNTER — Telehealth: Payer: Self-pay | Admitting: Internal Medicine

## 2010-10-12 NOTE — Telephone Encounter (Signed)
Pts daughter called and is needing to see what they need to do in order to get help with hospice? Also is needing to get a refill of Xopenix 0.63 Inhaler, every 6 hrs prn. This med was request by [facility], but med never called in. Pt was told by doctor at [facility] that pt only has 6-8 wks to live. Pls call in med to [facility] on [address].

## 2010-10-13 NOTE — H&P (Signed)
NAMECHAPIN, ARDUINI NO.:  [id number]  MEDICAL RECORD NO.:  [id number]  LOCATION:  [facility]                         FACILITY:  [facility]  PHYSICIAN:  [name], MD           DATE OF BIRTH:  1923-03-28  DATE OF ADMISSION:  09/24/2010 DATE OF DISCHARGE:                             HISTORY & PHYSICAL   PRIMARY CARE PHYSICIAN:  Gordy Savers, MD  CARDIOLOGIST:  Madolyn Frieze. Jens Som, MD, [facility]  REASON FOR ADMISSION:  Weakness, pneumonia, and urinary tract infection.  ADVANCE DIRECTIVE:  Full code.  HISTORY OF PRESENT ILLNESS:  Mr. Persons is an 77 year old male with multiple medical problems including history of recent admission for upper GI bleed, atrial fibrillation with controlled ventricular rate, hypertension, dementia, history of nondisplaced pelvic fracture, AAA at 4 cm by ultrasound February 2012, history of aspiration pneumonia, prostate cancer, brought in because he has been feeling weak and he was having transient chest pain.  He appeared quite weak in the emergency room, but was able to answer questions appropriately.  Evaluation in the emergency room included an EKG which shows atrial fibrillation at rate of 60 and no acute ST-T changes.  Normal white count, hemoglobin of 10.6.  His urinalysis is positive with many bacteria and positive leukocyte esterase but not too many WBCs.  He also had a negative head CT and a normal creatinine of 0.6.  His blood glucose is 106.  A chest x-ray was performed, and it revealed a left lower lobe airspace disease consistent with either pneumonia or aspiration pneumonitis.  Hospitalist was asked to admit this patient because of pneumonia and urinary tract infection. Review of his old records showed that he has been a full code.  As noted, he was discharged on September 22, 2010.  PAST MEDICAL HISTORY: 1. AFib and nonsustained V tach followed by Dr. Jens Som. 2. History of left femoral fracture in 2012.  Intramedullary nail   fixation in 2012 by Dr. Ave Filter. 3. AAA 4 cm by ultrasound February 2012. 4. Dementia. 5. Depression. 6. History of CVA. 7. History of nondisplaced pelvic fracture. 8. Chronic leukocytosis. 9. Impaired fasting glucose. 10.Hypertension. 11.History of prostate cancer. 12.History of aspiration.  SOCIAL HISTORY:  He lives with his daughter and son-in-law.  He is a retired Chartered loss adjuster and Naval architect.  He was a prior tobacco user.  FAMILY HISTORY:  Noncontributory.  MEDICATIONS: 1. Pravastatin 40 mg at bedtime. 2. Toprol 50 mg b.i.d. 3. Klor-Con. 4. Hydrocodone. 5. Lasix 20 mg per day. 6. Diltiazem 180 mg per day. 7. Digoxin 0.125 mg per day. 8. Aspirin 81 mg per day. 9. Supplement Ensure.  REVIEW OF SYSTEMS:  No shortness of breath, nausea or vomiting, fever or chills.  ALLERGIES:  No known drug allergies.  PHYSICAL EXAMINATION:  VITAL SIGNS:  Temperature 97, blood pressure 128/67, pulse 75, respiratory rate of 22. GENERAL:  He is in no apparent distress and does not have any tachypnea or dyspnea.  He looked quite debilitated and is quite lethargic but easily arousable and able to answer questions appropriately and correctly. HEENT:  He has facial symmetry.  Tongue is midline. NECK:  Carotid without any bruit.  Neck is supple. CARDIAC:  S1, S2 irregularly irregular with a 2-3/6 systolic ejection murmur at the left sternal border. LUNGS:  No wheezes or crackles. ABDOMEN:  Soft, nondistended, nontender. EXTREMITIES:  With no edema.  He is quite cachectic.  No calf tenderness and good distal pulses bilaterally. SKIN:  Warm and dry. PSYCHIATRIC:  Unremarkable as well.  OBJECTIVE FINDINGS:  EKG:  Atrial fibrillation at rate of 60.  No acute ST-T changes.  Urinalysis show 0-2 wbc's, many bacteria, trace leukocyte esterase.  Chest x-ray shows old granulomatous disease, increased density at the left lung base most compatible with airspace disease or with atelectasis.   This may represent aspiration pneumonitis or pneumonia.  Head CT shows atrophy but no acute intracranial abnormality. Potassium 3.2, creatinine of 0.6, blood glucose of 106.  Digoxin of 0.6. White count of 9900, hemoglobin of 10.6, MCV of 92, troponin less than 0.3, INR of 1.04.  IMPRESSION:  This is an 77 year old male with multiple medical problems, lives at home with his daughter, admitted for pneumonia and urinary tract infection.  He also has chest pain.  Will be ruled out with serial CPKs and troponins.  With multiple medical problems, he looked debilitated, but able to answer questions appropriately and still able to live at home with his family.  We will admit him to telemetry, give vancomycin and Zosyn for his pneumonia, rule out with serial CPKs and troponins, continue his medications except for the Lasix.  We will give him gentle IV fluid as well.  He is a full code and will be admitted to Telemetry Team 5.     [name], MD     PL/MEDQ  D:  09/24/2010  T:  09/24/2010  Job:  [id number]  Electronically Signed by [name]  on 10/13/2010 02:43:56 AM

## 2010-10-15 ENCOUNTER — Telehealth: Payer: Self-pay | Admitting: Internal Medicine

## 2010-10-15 MED ORDER — LEVALBUTEROL HCL 0.63 MG/3ML IN NEBU: 0.6300 mg | INHALATION_SOLUTION | RESPIRATORY_TRACT | Status: DC | PRN

## 2010-10-15 NOTE — Telephone Encounter (Signed)
xopenex neb rx efiled to [facility]

## 2010-10-15 NOTE — Telephone Encounter (Signed)
ok 

## 2010-10-15 NOTE — Telephone Encounter (Signed)
Spoke with hospices - gave order

## 2010-10-15 NOTE — Telephone Encounter (Signed)
Requesting verbal order for hospice care for this patient.

## 2010-10-15 NOTE — Telephone Encounter (Signed)
Please ok xopenex inhaler - not on current med list

## 2010-11-06 ENCOUNTER — Telehealth: Payer: Self-pay | Admitting: Internal Medicine

## 2010-11-06 MED ORDER — POTASSIUM CHLORIDE CRYS ER 20 MEQ PO TBCR: 20.0000 meq | EXTENDED_RELEASE_TABLET | Freq: Two times a day (BID) | ORAL | Status: DC

## 2010-11-06 NOTE — Telephone Encounter (Signed)
Hospice called and said that [facility] on [address] sent over a refill req for pts potassium chloride SA (K-DUR,KLOR-CON) 20 MEQ tablet and have not rcvd response. Does Dr Amador Cunas not want pt to have the med? Also pt is not on any diurectic. If pt needs blood drawn to test potassium lvls, pls advise.

## 2010-11-06 NOTE — Telephone Encounter (Signed)
efiled KCl refill to [facility]

## 2010-11-06 NOTE — Telephone Encounter (Signed)
Okay to refill #90 refill x6

## 2010-11-06 NOTE — Telephone Encounter (Signed)
No fax recv'd or e-request at this time - please advise

## 2011-01-12 ENCOUNTER — Emergency Department (HOSPITAL_COMMUNITY)
Admission: EM | Admit: 2011-01-12 | Discharge: 2011-01-12 | Disposition: A | Discharge status: Home / Self Care | Attending: Emergency Medicine | Admitting: Emergency Medicine

## 2011-01-12 ENCOUNTER — Emergency Department (HOSPITAL_COMMUNITY)

## 2011-01-12 DIAGNOSIS — F0390 Unspecified dementia without behavioral disturbance: Secondary | ICD-10-CM | POA: Insufficient documentation

## 2011-01-12 DIAGNOSIS — Z66 Do not resuscitate: Secondary | ICD-10-CM | POA: Insufficient documentation

## 2011-01-12 DIAGNOSIS — M546 Pain in thoracic spine: Secondary | ICD-10-CM | POA: Insufficient documentation

## 2011-01-12 DIAGNOSIS — I4891 Unspecified atrial fibrillation: Secondary | ICD-10-CM | POA: Insufficient documentation

## 2011-01-12 DIAGNOSIS — Z79899 Other long term (current) drug therapy: Secondary | ICD-10-CM | POA: Insufficient documentation

## 2011-01-12 DIAGNOSIS — I10 Essential (primary) hypertension: Secondary | ICD-10-CM | POA: Insufficient documentation

## 2011-01-12 DIAGNOSIS — T148XXA Other injury of unspecified body region, initial encounter: Secondary | ICD-10-CM | POA: Insufficient documentation

## 2011-01-12 DIAGNOSIS — R071 Chest pain on breathing: Secondary | ICD-10-CM | POA: Insufficient documentation

## 2011-01-12 DIAGNOSIS — W010XXA Fall on same level from slipping, tripping and stumbling without subsequent striking against object, initial encounter: Secondary | ICD-10-CM | POA: Insufficient documentation

## 2011-01-12 DIAGNOSIS — IMO0002 Reserved for concepts with insufficient information to code with codable children: Secondary | ICD-10-CM | POA: Insufficient documentation

## 2011-01-12 DIAGNOSIS — R109 Unspecified abdominal pain: Secondary | ICD-10-CM | POA: Insufficient documentation

## 2011-01-12 DIAGNOSIS — Y92009 Unspecified place in unspecified non-institutional (private) residence as the place of occurrence of the external cause: Secondary | ICD-10-CM | POA: Insufficient documentation

## 2011-01-14 ENCOUNTER — Encounter: Payer: Medicare Other | Admitting: Cardiology

## 2011-01-14 NOTE — Progress Notes (Signed)
HPI:[name] is a pleasant gentleman with hypertension, history of atrial flutter ablation, and vascular disease. He also has atrial fibrillation that we are treating with rate control and anticoagulation. Previous cardiac catheterization in October 2006 showed nonobstructive coronary disease and an ejection fraction of 55%. There was a 90% left and 75% right renal artery stenosis. He had previous stenting of his left renal artery in October of 2007. Last echocardiogram in Feb 2012 showed nl LV function. There was mild mitral regurgitation. The left atrium was moderately dilated. The right was markedly dilated. The right ventricle was mildly dilated. Carotid Dopplers in February of 2012 showed 0-39% stenosis bilaterally and F/U recommended in 2 years. Last abdominal ultrasound in Feb 2012 showed AAA of 3.8 x 3.8 cm and 1-59% bilateral RAS. He has had multiple recent admissions. One occurred after falling and suffering a femur fracture. He also had a GI bleed. He also was admitted for pneumonia. I last saw him in July of 2012. Since then,    Current Outpatient Prescriptions  Medication Sig Dispense Refill  . aspirin 81 MG tablet Take 81 mg by mouth daily.        . Diclofenac Sodium 3 % GEL Place 1 application onto the skin 2 (two) times daily.  12 Tube  6  . diltiazem (CARDIZEM CD) 180 MG 24 hr capsule Take 1 capsule (180 mg total) by mouth daily.  30 capsule  12  . levalbuterol (XOPENEX) 0.63 MG/3ML nebulizer solution Take 3 mLs (0.63 mg total) by nebulization every 4 (four) hours as needed for wheezing.  120 mL  2  . metoprolol (LOPRESSOR) 50 MG tablet Take 1 tablet (50 mg total) by mouth 2 (two) times daily.  180 tablet  2  . potassium chloride SA (K-DUR,KLOR-CON) 20 MEQ tablet Take 1 tablet (20 mEq total) by mouth 2 (two) times daily.  180 tablet  3  . pravastatin (PRAVACHOL) 40 MG tablet Take 40 mg by mouth daily.           Past Medical History  Diagnosis Date  . Atrial fibrillation   . AAA  (abdominal aortic aneurysm)   . Hypertension   . Carotid stenosis   . Hypercholesteremia   . Cardiomyopathy     improved  . Renal artery stenosis   . COPD (chronic obstructive pulmonary disease)   . Osteoarthritis   . Prostate cancer   . Atrial flutter     s/p ablation  . Hip fracture     Past Surgical History  Procedure Date  . Appendectomy   . Cholecystectomy   . Tonsillectomy     History   Social History  . Marital Status: Widowed    Spouse Name: N/A    Number of Children: N/A  . Years of Education: N/A   Occupational History  . Not on file.   Social History Main Topics  . Smoking status: Former Games developer  . Smokeless tobacco: Not on file  . Alcohol Use: No  . Drug Use: No  . Sexually Active: Not on file   Other Topics Concern  . Not on file   Social History Narrative  . No narrative on file    ROS: no fevers or chills, productive cough, hemoptysis, dysphasia, odynophagia, melena, hematochezia, dysuria, hematuria, rash, seizure activity, orthopnea, PND, pedal edema, claudication. Remaining systems are negative.  Physical Exam: Well-developed well-nourished in no acute distress.  Skin is warm and dry.  HEENT is normal.  Neck is supple. No thyromegaly.  Chest is clear to auscultation with normal expansion.  Cardiovascular exam is regular rate and rhythm.  Abdominal exam nontender or distended. No masses palpated. Extremities show no edema. neuro grossly intact  ECG     This encounter was created in error - please disregard.

## 2011-01-21 ENCOUNTER — Inpatient Hospital Stay (HOSPITAL_COMMUNITY)
Admission: EM | Admit: 2011-01-21 | Discharge: 2011-01-25 | DRG: 690 | Disposition: A | Discharge status: Skilled Nursing Facility | Attending: Internal Medicine | Admitting: Internal Medicine

## 2011-01-21 ENCOUNTER — Emergency Department (HOSPITAL_COMMUNITY)

## 2011-01-21 ENCOUNTER — Other Ambulatory Visit: Payer: Self-pay

## 2011-01-21 ENCOUNTER — Telehealth: Payer: Self-pay

## 2011-01-21 DIAGNOSIS — R4182 Altered mental status, unspecified: Secondary | ICD-10-CM

## 2011-01-21 DIAGNOSIS — Z7982 Long term (current) use of aspirin: Secondary | ICD-10-CM

## 2011-01-21 DIAGNOSIS — Z9181 History of falling: Secondary | ICD-10-CM

## 2011-01-21 DIAGNOSIS — Z66 Do not resuscitate: Secondary | ICD-10-CM | POA: Diagnosis present

## 2011-01-21 DIAGNOSIS — Z8673 Personal history of transient ischemic attack (TIA), and cerebral infarction without residual deficits: Secondary | ICD-10-CM

## 2011-01-21 DIAGNOSIS — Z79899 Other long term (current) drug therapy: Secondary | ICD-10-CM

## 2011-01-21 DIAGNOSIS — N390 Urinary tract infection, site not specified: Principal | ICD-10-CM

## 2011-01-21 DIAGNOSIS — R443 Hallucinations, unspecified: Secondary | ICD-10-CM

## 2011-01-21 DIAGNOSIS — I4891 Unspecified atrial fibrillation: Secondary | ICD-10-CM

## 2011-01-21 DIAGNOSIS — Z87891 Personal history of nicotine dependence: Secondary | ICD-10-CM

## 2011-01-21 DIAGNOSIS — Z8546 Personal history of malignant neoplasm of prostate: Secondary | ICD-10-CM

## 2011-01-21 DIAGNOSIS — H3530 Unspecified macular degeneration: Secondary | ICD-10-CM | POA: Diagnosis present

## 2011-01-21 DIAGNOSIS — B964 Proteus (mirabilis) (morganii) as the cause of diseases classified elsewhere: Secondary | ICD-10-CM | POA: Diagnosis present

## 2011-01-21 DIAGNOSIS — F0390 Unspecified dementia without behavioral disturbance: Secondary | ICD-10-CM

## 2011-01-21 DIAGNOSIS — R1310 Dysphagia, unspecified: Secondary | ICD-10-CM | POA: Diagnosis present

## 2011-01-21 DIAGNOSIS — R5381 Other malaise: Secondary | ICD-10-CM

## 2011-01-21 DIAGNOSIS — Z87440 Personal history of urinary (tract) infections: Secondary | ICD-10-CM

## 2011-01-21 HISTORY — DX: Unspecified macular degeneration: H35.30

## 2011-01-21 LAB — URINALYSIS, ROUTINE W REFLEX MICROSCOPIC
Glucose, UA: NEGATIVE mg/dL
Specific Gravity, Urine: 1.013 (ref 1.005–1.030)
Urobilinogen, UA: 0.2 mg/dL (ref 0.0–1.0)

## 2011-01-21 LAB — COMPREHENSIVE METABOLIC PANEL
Alkaline Phosphatase: 115 U/L (ref 39–117)
BUN: 12 mg/dL (ref 6–23)
CO2: 28 mEq/L (ref 19–32)
Chloride: 103 mEq/L (ref 96–112)
Creatinine, Ser: 0.65 mg/dL (ref 0.50–1.35)
GFR calc non Af Amer: 86 mL/min — ABNORMAL LOW (ref >90)
Total Bilirubin: 0.4 mg/dL (ref 0.3–1.2)

## 2011-01-21 LAB — CBC
HCT: 35.4 % — ABNORMAL LOW (ref 39.0–52.0)
Hemoglobin: 11.8 g/dL — ABNORMAL LOW (ref 13.0–17.0)
RBC: 4.11 MIL/uL — ABNORMAL LOW (ref 4.22–5.81)
WBC: 9.2 10*3/uL (ref 4.0–10.5)

## 2011-01-21 LAB — DIFFERENTIAL
Lymphocytes Relative: 20 % (ref 12–46)
Lymphs Abs: 1.9 10*3/uL (ref 0.7–4.0)
Monocytes Absolute: 0.7 10*3/uL (ref 0.1–1.0)
Monocytes Relative: 8 % (ref 3–12)
Neutro Abs: 6.4 10*3/uL (ref 1.7–7.7)

## 2011-01-21 LAB — URINE MICROSCOPIC-ADD ON

## 2011-01-21 LAB — TROPONIN I: Troponin I: <0.3 ng/mL (ref <0.30)

## 2011-01-21 MED ORDER — DEXTROSE 5 % IV SOLN
1.0000 g | Freq: Once | INTRAVENOUS | Status: AC
  Administered 2011-01-21: 1 g via INTRAVENOUS
  Filled 2011-01-21: qty 10

## 2011-01-21 MED ORDER — SODIUM CHLORIDE 0.9 % IV SOLN
INTRAVENOUS | Status: DC
  Administered 2011-01-21: 19:00:00 via INTRAVENOUS

## 2011-01-21 MED ORDER — POTASSIUM CHLORIDE 10 MEQ/100ML IV SOLN
10.0000 meq | Freq: Once | INTRAVENOUS | Status: AC
  Administered 2011-01-21: 10 meq via INTRAVENOUS
  Filled 2011-01-21: qty 100

## 2011-01-21 MED ORDER — SODIUM CHLORIDE 0.9 % IV BOLUS (SEPSIS)
750.0000 mL | Freq: Once | INTRAVENOUS | Status: AC
  Administered 2011-01-21: 750 mL via INTRAVENOUS

## 2011-01-21 MED ORDER — DEXTROSE 5 % IV SOLN
INTRAVENOUS | Status: AC
  Filled 2011-01-21: qty 50

## 2011-01-21 NOTE — ED Notes (Signed)
Pt undressed, in gown, on monitor, continuous pulse oximetry and blood pressure cuff; family at bedside 

## 2011-01-21 NOTE — H&P (Signed)
NAMEHARVEER, [name] NO.:  [id number]  MEDICAL RECORD NO.:  [id number]  LOCATION:  [id number]                        FACILITY:  [facility]  PHYSICIAN:  Tarry Kos, MD       DATE OF BIRTH:  January 01, 1924  DATE OF ADMISSION:  01/21/2011 DATE OF DISCHARGE:                             HISTORY & PHYSICAL   CHIEF COMPLAINT:  Dementia with agitation, needs placement.  HISTORY OF PRESENT ILLNESS:  [name] is an 77 year old male with a history of moderate dementia who has been living at home with his daughter and has had some worsening behavior problems over the last several days and has gotten to the point where his family can no longer handle him at home, and they are needing a placement.  He has been having more frequent falls.  History is limited as the patient's family has already left and he is only oriented to person only.  He says he has 6 children but I am not sure if this is accurate.  [name] is verbal and he does deny pain when I asked him if he is hurting anywhere.  He does have a chronic indwelling Foley catheter.  REVIEW OF SYSTEMS:  Otherwise obtainable.  All the rest of his history is obtained from e-chart as there is nothing in epic.  PAST MEDICAL HISTORY:  History of aspiration, pneumonia, dementia, recurrent UTIs, AFib, not a Coumadin candidate, history of AAA, left femoral neck fracture repair, history of prostate cancer, chronic hypoxic respiratory failure, history of CVA in the past, history of vent- dependent respiratory failure in February 2012.  SOCIAL HISTORY:  He lives with his daughter.  ALLERGIES:  None are listed.  MEDICATIONS:  Listed in epic are; 1. Aspirin 81 mg a day. 2. Thorazine 25 mg at bedtime. 3. Diltiazem 180 mg daily. 4. Ativan as needed. 5. Lopressor 50 mg twice a day. 6. Flagyl. 7. KCl. 8. Pravachol 40 mg daily.  PHYSICAL EXAMINATION:  VITAL SIGNS:  Temperature is 99.2, pulse in the 70s, respirations normal at 15, blood  pressure 121/64, 100% O2 sats on room air. GENERAL:  He is alert in no apparent distress.  Overall chronically ill appearing and cachectic and dehydrated.  He is verbal, oriented to person only.  There is currently no agitation. HEENT:  Extraocular muscles intact.  Pupils are equal and reactive to light.  Oropharynx is clear.  Mucous membrane is moist. NECK:  No JVD.  No carotid bruits. COR:  Regular rate and rhythm without murmurs, rubs, or gallops. CHEST:  Clear to auscultation bilaterally. ABDOMEN:  Soft, nontender, nondistended, positive bowel sounds, no hepatosplenomegaly. EXTREMITIES:  No clubbing, cyanosis, or edema. PSYCH:  Normal affect. NEURO:  No focal neurologic deficits. SKIN:  No rashes.  His electrolytes were normal with a potassium at 3.3.  Troponin is negative.  Urinalysis is pending.  White count is normal.  Hemoglobin is 11.8.  Chest x-ray is negative for any infiltrate.  ASSESSMENT AND PLAN:  This is an 77 year old male with dementia which is worsening. 1. Dementia which is worsening.  Thus far his workup has been     negative.  I am going to  order a CAT scan of his head and followup     on his urinalysis to make sure he does not have UTI.  We will     observe his overnight, serial his cardiac enzymes, and we will     start antibiotics if his urinalysis shows signs for infection. 2. Needs placement and obtain a social work consult and a physical     therapy consult. 3. We will also check ammonia level, lactic acid, and pro calcitonin     level. 4. History of aspiration pneumonia.  We will place the patient on a     pureed diet.  His lungs are clear right now.  However if he spikes     a temperature, we will cover him for aspiration pneumonia if     needed. 5. Currently, the patient is presumed full code.  Further     recommendation pending over hospital course.          ______________________________ Tarry Kos, MD     RD/MEDQ  D:  01/21/2011  T:   01/21/2011  Job:  [id number]

## 2011-01-21 NOTE — ED Notes (Signed)
Assumed care on pt. Resting with no pain or discomfort, IV site unremarkable , foley catheter intact , bedrails up , call light within reach , pt. Confused and disoriented.
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
## 2011-01-21 NOTE — ED Notes (Signed)
Ems report pt is alert and oriented times 3.

## 2011-01-21 NOTE — ED Notes (Signed)
Complete bed change , repositioned for comfort, respirations unlabored, IV site unremarkable , foley catheter .

## 2011-01-21 NOTE — ED Notes (Signed)
Triage tab not working on epic.  Pt coming from home where he lives with his daughter.   Ems report- family sending pt here to "get checked out"  For dementia and possible uti.  Pt daughter reports that pt has become more violent recently and combative.  Ems report pt has had a foley catheter that has not been replaced in last three weeks.

## 2011-01-21 NOTE — H&P (Signed)
  [phone] 

## 2011-01-21 NOTE — Telephone Encounter (Signed)
Took live call from [facility] - hospice - Pt is having halliucinations, increase agitation, fell last week and possible dehydration . Family is concerned he is declining. Dr. Amador Cunas out of office . Gave instructions to ER for eval.

## 2011-01-21 NOTE — ED Notes (Signed)
2 blankets given to pt

## 2011-01-21 NOTE — ED Notes (Signed)
Returned from CT scan . Resting with no pain , respirations unlabored, IV site intact / foley catheter intact. Remained confused/  Disorented/ demented. Bedrails up .

## 2011-01-21 NOTE — ED Notes (Signed)
Pt daughter states Pt has been hallucinating at home. Daughter states pt see people that are not there and has conversations with imaginary people.  Pt told this RN his birthday and stated that he was at [facility] cone currently but was unaware of the date.   Pt daughter states that she does not feel safe bringing pt home and does feel that she can care for him.

## 2011-01-21 NOTE — ED Notes (Signed)
Pt came in to er with existing catheter.  Urine culture and sample drawn from proximal port on tubing.  Urine is cloudy and has a foul odor.

## 2011-01-21 NOTE — ED Notes (Signed)
Removed existing foley that pt had prior to arrival.  Family states existing foley had been in for 3 weeks.  Placed new 94fr foley catheter per sterile technique. Pt tolerated well.

## 2011-01-22 ENCOUNTER — Encounter: Payer: Self-pay | Admitting: Emergency Medicine

## 2011-01-22 DIAGNOSIS — Z8673 Personal history of transient ischemic attack (TIA), and cerebral infarction without residual deficits: Secondary | ICD-10-CM | POA: Insufficient documentation

## 2011-01-22 DIAGNOSIS — R1310 Dysphagia, unspecified: Secondary | ICD-10-CM | POA: Diagnosis present

## 2011-01-22 DIAGNOSIS — F0390 Unspecified dementia without behavioral disturbance: Secondary | ICD-10-CM | POA: Diagnosis present

## 2011-01-22 DIAGNOSIS — I4891 Unspecified atrial fibrillation: Secondary | ICD-10-CM | POA: Diagnosis present

## 2011-01-22 DIAGNOSIS — N390 Urinary tract infection, site not specified: Secondary | ICD-10-CM | POA: Diagnosis present

## 2011-01-22 DIAGNOSIS — Z8546 Personal history of malignant neoplasm of prostate: Secondary | ICD-10-CM | POA: Insufficient documentation

## 2011-01-22 DIAGNOSIS — R5381 Other malaise: Secondary | ICD-10-CM | POA: Diagnosis present

## 2011-01-22 LAB — CBC
Hemoglobin: 12.7 g/dL — ABNORMAL LOW (ref 13.0–17.0)
MCH: 28.5 pg (ref 26.0–34.0)
Platelets: 256 10*3/uL (ref 150–400)
RBC: 4.46 MIL/uL (ref 4.22–5.81)
WBC: 9.5 10*3/uL (ref 4.0–10.5)

## 2011-01-22 LAB — LACTIC ACID, PLASMA: Lactic Acid, Venous: 1.9 mmol/L (ref 0.5–2.2)

## 2011-01-22 LAB — CREATININE, SERUM
Creatinine, Ser: 0.53 mg/dL (ref 0.50–1.35)
GFR calc Af Amer: >90 mL/min (ref >90)

## 2011-01-22 LAB — PROCALCITONIN: Procalcitonin: <0.1 ng/mL

## 2011-01-22 MED ORDER — POTASSIUM CHLORIDE IN NACL 20-0.9 MEQ/L-% IV SOLN
INTRAVENOUS | Status: AC
  Administered 2011-01-22: 11:00:00 via INTRAVENOUS
  Filled 2011-01-22 (×2): qty 1000

## 2011-01-22 MED ORDER — LORAZEPAM 0.5 MG PO TABS
0.5000 mg | ORAL_TABLET | Freq: Every day | ORAL | Status: DC
  Administered 2011-01-23 – 2011-01-24 (×4): 0.5 mg via ORAL
  Filled 2011-01-22 (×3): qty 1

## 2011-01-22 MED ORDER — POTASSIUM CHLORIDE CRYS ER 20 MEQ PO TBCR
40.0000 meq | EXTENDED_RELEASE_TABLET | Freq: Two times a day (BID) | ORAL | Status: AC
  Administered 2011-01-23 (×2): 40 meq via ORAL
  Filled 2011-01-22 (×2): qty 2

## 2011-01-22 MED ORDER — ASPIRIN EC 81 MG PO TBEC
81.0000 mg | DELAYED_RELEASE_TABLET | Freq: Every day | ORAL | Status: DC
  Administered 2011-01-22 – 2011-01-25 (×4): 81 mg via ORAL
  Filled 2011-01-22 (×4): qty 1

## 2011-01-22 MED ORDER — METRONIDAZOLE 250 MG PO TABS
250.0000 mg | ORAL_TABLET | Freq: Four times a day (QID) | ORAL | Status: DC
  Administered 2011-01-22 – 2011-01-23 (×2): 250 mg via ORAL
  Filled 2011-01-22 (×7): qty 1

## 2011-01-22 MED ORDER — METOPROLOL TARTRATE 50 MG PO TABS
50.0000 mg | ORAL_TABLET | Freq: Two times a day (BID) | ORAL | Status: DC
  Administered 2011-01-23 – 2011-01-25 (×5): 50 mg via ORAL
  Filled 2011-01-22 (×7): qty 1

## 2011-01-22 MED ORDER — SIMVASTATIN 20 MG PO TABS
20.0000 mg | ORAL_TABLET | Freq: Every day | ORAL | Status: DC
  Administered 2011-01-23 – 2011-01-24 (×2): 20 mg via ORAL
  Filled 2011-01-22 (×3): qty 1

## 2011-01-22 MED ORDER — ENOXAPARIN SODIUM 40 MG/0.4ML ~~LOC~~ SOLN
40.0000 mg | SUBCUTANEOUS | Status: DC
  Filled 2011-01-22: qty 0.4

## 2011-01-22 MED ORDER — SODIUM CHLORIDE 0.9 % IV SOLN: INTRAVENOUS | Status: DC

## 2011-01-22 MED ORDER — DEXTROSE 5 % IV SOLN
1.0000 g | INTRAVENOUS | Status: DC
  Filled 2011-01-22: qty 10

## 2011-01-22 MED ORDER — DEXTROSE 5 % IV SOLN
1.0000 g | INTRAVENOUS | Status: DC
  Administered 2011-01-22 – 2011-01-23 (×2): 1 g via INTRAVENOUS
  Filled 2011-01-22 (×3): qty 10

## 2011-01-22 MED ORDER — CHLORPROMAZINE HCL 25 MG PO TABS
25.0000 mg | ORAL_TABLET | Freq: Every day | ORAL | Status: DC
  Administered 2011-01-22 – 2011-01-24 (×3): 25 mg via ORAL
  Filled 2011-01-22 (×4): qty 1

## 2011-01-22 MED ORDER — ENOXAPARIN SODIUM 30 MG/0.3ML ~~LOC~~ SOLN
30.0000 mg | SUBCUTANEOUS | Status: DC
  Administered 2011-01-23 – 2011-01-24 (×3): 30 mg via SUBCUTANEOUS
  Filled 2011-01-22 (×4): qty 0.3

## 2011-01-22 MED ORDER — DILTIAZEM HCL ER 180 MG PO CP24
180.0000 mg | ORAL_CAPSULE | Freq: Every day | ORAL | Status: DC
  Administered 2011-01-22 – 2011-01-25 (×4): 180 mg via ORAL
  Filled 2011-01-22 (×4): qty 1

## 2011-01-22 MED ORDER — POTASSIUM CHLORIDE CRYS ER 20 MEQ PO TBCR: 20.0000 meq | EXTENDED_RELEASE_TABLET | Freq: Two times a day (BID) | ORAL | Status: DC

## 2011-01-22 NOTE — ED Notes (Signed)
Report given to Charge nurse on 4500.  Paged admitting team to verify pt code status and to see about changing bed to palliative care.

## 2011-01-22 NOTE — ED Notes (Signed)
Attempted to call floor to give report.  Floor states cant take report at this time.  Floor states will call me back at [phone]

## 2011-01-22 NOTE — Plan of Care (Signed)
Problem: Phase I Progression Outcomes Goal: Voiding-avoid urinary catheter unless indicated Outcome: Not Applicable Date Met:  01/22/11 Patient came to the facility with a foley.

## 2011-01-22 NOTE — ED Notes (Signed)
Charge nurse notified to updated on pt's plan to be admitted for obsetvation .

## 2011-01-22 NOTE — Progress Notes (Signed)
DAILY PROGRESS NOTE                              GENERAL INTERNAL MEDICINE [facility]  SUBJECTIVE: Sleep he does not want to talk  OBJECTIVE: BP 131/69  Pulse 71  Temp(Src) 96.6 F (35.9 C) (Axillary)  Resp 18  SpO2 98% No intake or output data in the 24 hours ending 01/22/11 1443                    Weight change:  Physical Exam: General: Sleepy not in any acute distress. HEENT: anicteric sclera, pupils equal reactive to light and accommodation CVS: S1-S2 heard, no murmur rubs or gallops Chest: clear to auscultation bilaterally, no wheezing rales or rhonchi Abdomen:  normal bowel sounds, soft, nontender, nondistended, no organomegaly Neuro: Cranial nerves II-XII intact, no focal neurological deficits Extremities: no cyanosis, no clubbing or edema noted bilaterally   Lab Results:  Basename 01/21/11 1530  NA 141  K 3.3*  CL 103  CO2 28  GLUCOSE 109*  BUN 12  CREATININE 0.65  CALCIUM 9.3  MG --  PHOS --    Basename 01/21/11 1530  AST 22  ALT 10  ALKPHOS 115  BILITOT 0.4  PROT 6.3  ALBUMIN 3.0*   No results found for this basename: LIPASE:2,AMYLASE:2 in the last 72 hours  Basename 01/21/11 1530  WBC 9.2  NEUTROABS 6.4  HGB 11.8*  HCT 35.4*  MCV 86.1  PLT 265    Basename 01/21/11 1941  CKTOTAL --  CKMB --  CKMBINDEX --  TROPONINI <0.30   No results found for this basename: POCBNP:3 in the last 72 hours No results found for this basename: DDIMER:2 in the last 72 hours No results found for this basename: HGBA1C:2 in the last 72 hours No results found for this basename: CHOL:2,HDL:2,LDLCALC:2,TRIG:2,CHOLHDL:2,LDLDIRECT:2 in the last 72 hours No results found for this basename: TSH,T4TOTAL,FREET3,T3FREE,THYROIDAB in the last 72 hours No results found for this basename: VITAMINB12:2,FOLATE:2,FERRITIN:2,TIBC:2,IRON:2,RETICCTPCT:2 in the last 72 hours  Micro Results: Recent Results (from the past 240 hour(s))  URINE CULTURE     Status: Normal  (Preliminary result)   Collection Time   01/21/11  3:42 PM      Component Value Range Status Comment   Specimen Description URINE, CATHETERIZED   Final    Special Requests NONE   Final    Setup Time [id number]   Final    Colony Count >=100,000 COLONIES/ML   Final    Culture GRAM NEGATIVE RODS   Final    Report Status PENDING   Incomplete     Studies/Results: Ct Head Wo Contrast  01/21/2011  *RADIOLOGY REPORT*  Clinical Data: Altered mental status  CT HEAD WITHOUT CONTRAST  Technique:  Contiguous axial images were obtained from the base of the skull through the vertex without contrast.  Comparison: None.  Findings: Significant global atrophy.  Chronic ischemic changes. No mass effect, midline shift, or acute intracranial hemorrhage. Visualized paranasal sinuses are clear.  Cranium is intact.  IMPRESSION: Chronic ischemic changes and atrophy.  No acute intracranial pathology.  Original Report Authenticated By: Donavan Burnet, M.D.   Dg Chest Portable 1 View  01/21/2011  *RADIOLOGY REPORT*  Clinical Data: Altered mental status  PORTABLE CHEST - 1 VIEW  Comparison: None.  Findings: Heart is borderline enlarged.  Postoperative changes in the left upper lobe are present characterized by a staple line. Calcified granulomata are scattered  in both lungs.  Interstitial prominence is likely chronic.  Trachea is midline.  No pneumothorax.  Left costophrenic angle obscured.  IMPRESSION: Borderline cardiomegaly without decompensation.  Chronic and postoperative changes are noted.  Original Report Authenticated By: Donavan Burnet, M.D.   Medications: Scheduled Meds:    . cefTRIAXone (ROCEPHIN) IV  1 g Intravenous Once  . cefTRIAXone (ROCEPHIN) IV  1 g Intravenous Q24H  . dextrose      . potassium chloride  10 mEq Intravenous Once  . sodium chloride  750 mL Intravenous Once   Continuous Infusions:    . sodium chloride 100 mL/hr at 01/21/11 1856  . 0.9 % NaCl with KCl 20 mEq / L 75 mL/hr at 01/22/11  1040   PRN Meds:.  ASSESSMENT & PLAN: Principal Problem:  *UTI (lower urinary tract infection) Active Problems:  Dysphagia  Atrial fibrillation  Dementia  Debility  1. UTI: Patient U waves consistent with UTI. Patient started on Rocephin and urine was sent to the lab for culture and susceptibility. We'll adjust the antibiotics accordingly  2. Dementia: this as being chronic problem overall patient is being and if worsening for some time now and the family is thinking about skilled nursing facility placement this time if he is eligible.  3. Dysphagia: Continue his dysphagia 1 diet from home.  4. Atrial fibrillation: This is being rate controlled one beer blockers and calcium channel blockers. No use of anticoagulant. Probably because of patient's age and risk of falls.   LOS: 1 day   Tziporah Knoke A 01/22/2011, 2:43 PM

## 2011-01-22 NOTE — ED Notes (Signed)
Woke pt up from sleep. Upon assessment pt thought he was in [place]. Pt alert to himself, but disoriented to time and place.

## 2011-01-22 NOTE — Progress Notes (Signed)
Rm 4506  [name]   [facility] - RN  Related admission.  DNR with OOF DNR on shadow chart.   Pt sleeping soundly and does not awaken to verbal stimuli.  No family present.  Patient's home med list on shadow chart.  Per chart, family is requesting placement in SNF as disposition.  Please call [facility] @ [phone] with any needs or concerns.

## 2011-01-22 NOTE — ED Notes (Signed)
Patient is being transferred to 4506 per admission orders Ashley Royalty). DNR status and if further issue with code status Dr. Ashley Royalty will address on the unit (per flow manager).

## 2011-01-22 NOTE — ED Notes (Signed)
Reviewing chart, admission orders have not been released from all night.

## 2011-01-22 NOTE — ED Notes (Signed)
Resting with no distress, Rspirations unlabored, IV and foley catheter intact. Bedrails up .

## 2011-01-22 NOTE — ED Notes (Signed)
Pt. Repositioned for comfort, respirations unlabored , IV site unremarkable , foley catheter intact .

## 2011-01-22 NOTE — Progress Notes (Signed)
   Called by Nursing staff to address the code status as the patient sleepy and do not respond appropriately.  Called Chelsea Aus, the patient daughter. She mentioned that the patient is active Hospice patient and he is DNR-DNI  DNR order placed.  Diet changed to Dysphagia 2 with thin liquids, this is his home Diet.  Patient baseline at home is total care. He needs help for ADLs and to eat his meals.

## 2011-01-22 NOTE — ED Notes (Signed)
Pt. Sleeping , no distress, respirations unlabored, IV and foley catheter intact.

## 2011-01-22 NOTE — ED Notes (Signed)
Report received from Robert RN

## 2011-01-22 NOTE — ED Notes (Signed)
Attempted for second time to call report to floor.  Floor states can not take report at this time again.  Gave my number of [phone] for rn to call me back.

## 2011-01-23 LAB — CBC
Hemoglobin: 12.2 g/dL — ABNORMAL LOW (ref 13.0–17.0)
MCH: 28.4 pg (ref 26.0–34.0)
MCHC: 32.4 g/dL (ref 30.0–36.0)
Platelets: 255 10*3/uL (ref 150–400)
RBC: 4.3 MIL/uL (ref 4.22–5.81)

## 2011-01-23 LAB — CARDIAC PANEL(CRET KIN+CKTOT+MB+TROPI)
CK, MB: 2.5 ng/mL (ref 0.3–4.0)
CK, MB: 2.4 ng/mL (ref 0.3–4.0)
Relative Index: INVALID (ref 0.0–2.5)
Troponin I: <0.3 ng/mL (ref <0.30)

## 2011-01-23 LAB — BASIC METABOLIC PANEL
CO2: 26 mEq/L (ref 19–32)
Calcium: 9.4 mg/dL (ref 8.4–10.5)
GFR calc non Af Amer: 88 mL/min — ABNORMAL LOW (ref >90)
Glucose, Bld: 82 mg/dL (ref 70–99)
Potassium: 3.7 mEq/L (ref 3.5–5.1)
Sodium: 144 mEq/L (ref 135–145)

## 2011-01-23 LAB — URINE CULTURE

## 2011-01-23 MED ORDER — POTASSIUM CHLORIDE CRYS ER 20 MEQ PO TBCR: 20.0000 meq | EXTENDED_RELEASE_TABLET | Freq: Two times a day (BID) | ORAL | Status: DC

## 2011-01-23 MED ORDER — POTASSIUM CHLORIDE CRYS ER 20 MEQ PO TBCR
20.0000 meq | EXTENDED_RELEASE_TABLET | Freq: Two times a day (BID) | ORAL | Status: DC
  Administered 2011-01-24 – 2011-01-25 (×4): 20 meq via ORAL
  Filled 2011-01-23 (×4): qty 1

## 2011-01-23 NOTE — Progress Notes (Signed)
Rm 4506  [name]   [facility] Related admission. DNR.  Pt alert, sitting up in chair following PT eval.  Multiple family members at bedside.  Dtr Eileen Stanford interested in talking with SW here at [facility] to discuss disposition of placement.  Notes by MD state dtr can no longer care for pt at home.  Pt states he "hurt all over" then described it was inside him like someone was punching him.  [facility] SW present at visit.  Spoke via phone to [facility] SW Suzanna and she will call and discuss disposition with dtr [place]. Please call [facility] @ [phone] with any needs or decisions on disposition.   Thank you. [facility]  [facility] Liaison RN

## 2011-01-23 NOTE — Progress Notes (Addendum)
DAILY PROGRESS NOTE                              GENERAL INTERNAL MEDICINE [facility]  SUBJECTIVE: Odor at bedside. Patient is sitting on a chair he is pleasantly demented asking about his coffee.  OBJECTIVE: BP 117/74  Pulse 71  Temp(Src) 97.1 F (36.2 C) (Oral)  Resp 18  SpO2 95%  Intake/Output Summary (Last 24 hours) at 01/23/11 1304 Last data filed at 01/23/11 1100  Gross per 24 hour  Intake   1105 ml  Output    725 ml  Net    380 ml                      Weight change:  Physical Exam: General: Sleepy not in any acute distress. HEENT: anicteric sclera, pupils equal reactive to light and accommodation CVS: S1-S2 heard, no murmur rubs or gallops Chest: clear to auscultation bilaterally, no wheezing rales or rhonchi Abdomen:  normal bowel sounds, soft, nontender, nondistended, no organomegaly Neuro: Cranial nerves II-XII intact, no focal neurological deficits Extremities: no cyanosis, no clubbing or edema noted bilaterally   Lab Results:  Basename 01/23/11 0630 01/22/11 1946 01/21/11 1530  NA 144 -- 141  K 3.7 -- 3.3*  CL 108 -- 103  CO2 26 -- 28  GLUCOSE 82 -- 109*  BUN 7 -- 12  CREATININE 0.62 0.53 --  CALCIUM 9.4 -- 9.3  MG -- -- --  PHOS -- -- --    Basename 01/23/11 0630 01/22/11 1946 01/21/11 1530  WBC 7.2 9.5 --  NEUTROABS -- -- 6.4  HGB 12.2* 12.7* --  HCT 37.6* 39.0 --  MCV 87.4 87.4 --  PLT 255 256 --    Micro Results: Recent Results (from the past 240 hour(s))  URINE CULTURE     Status: Normal (Preliminary result)   Collection Time   01/21/11  3:42 PM      Component Value Range Status Comment   Specimen Description URINE, CATHETERIZED   Final    Special Requests NONE   Final    Setup Time [id number]   Final    Colony Count >=100,000 COLONIES/ML   Final    Culture GRAM NEGATIVE RODS   Final    Report Status PENDING   Incomplete     Studies/Results: Ct Head Wo Contrast  01/21/2011  *RADIOLOGY REPORT*  IMPRESSION: Chronic ischemic  changes and atrophy.  No acute intracranial pathology.  Medications: Scheduled Meds:    . aspirin EC  81 mg Oral Daily  . cefTRIAXone (ROCEPHIN) IV  1 g Intravenous Q24H  . chlorproMAZINE  25 mg Oral QHS  . diltiazem  180 mg Oral Daily  . enoxaparin  30 mg Subcutaneous Q24H  . LORazepam  0.5 mg Oral QHS  . metoprolol  50 mg Oral BID  . metroNIDAZOLE  250 mg Oral QID  . potassium chloride  20 mEq Oral BID  . potassium chloride  40 mEq Oral BID  . simvastatin  20 mg Oral q1800  . DISCONTD: cefTRIAXone (ROCEPHIN) IV  1 g Intravenous Q24H  . DISCONTD: enoxaparin  40 mg Subcutaneous Q24H  . DISCONTD: potassium chloride SA  20 mEq Oral BID  . DISCONTD: potassium chloride  20 mEq Oral BID   Continuous Infusions:    . 0.9 % NaCl with KCl 20 mEq / L 75 mL/hr at 01/22/11 1848  . DISCONTD: sodium chloride  100 mL/hr at 01/21/11 1856  . DISCONTD: sodium chloride     PRN Meds:.  ASSESSMENT & PLAN: Principal Problem:  *UTI (lower urinary tract infection) Active Problems:  Dysphagia  Atrial fibrillation  Dementia  Debility  1. UTI: Patient has indwelling Foley catheter, this is probably catheter related infection. Patient urinalysis is consistent with UTI. Patient started on Rocephin and urine was sent to the lab for culture and susceptibility. We'll adjust the antibiotics accordingly, currently growing gram-negative rods.  2. Dementia: this as being chronic problem overall patient is worsening for some time now and the family is placement as he is a functional status is worsening. Will await bone marrow the hospice team discussion with the family to determine the patient is going on for a skilled nursing facility with palliative medicine or a residential hospice  3. Dysphagia: Continue his dysphagia 2 with a thin liquids consistency that he takes at home  4. Atrial fibrillation: This is being rate controlled one beta blockers and calcium channel blockers. No use of anticoagulant.  Probably because of patient's age and risk of falls.  5. Advanced directives: Patient is a DNR/DNI, he is active hospice patient. Hospice team being following during this hospital stay there approaching the family to decide about the disposition.   LOS: 2 days   Anyra Kaufman A 01/23/2011, 1:04 PM

## 2011-01-23 NOTE — Progress Notes (Signed)
Patient eating breakfast upon arrival.  He was alert and talkative.  He wants to go home and is unsure where his Daughter wants him to go.  Hospice Social Worker attempted to contact Daughter, Eileen Stanford, to discuss disposition.  Social Worker will follow up with Hospital Social Worker to discuss potential placement.  Social Worker will continue to follow.  Madara Shillinglaw

## 2011-01-23 NOTE — Progress Notes (Signed)
Hospice Social Worker came back for second visit to Patient, per request of Daughter, Eileen Stanford.  She was present with Patient, her husband, and her sister.  Hospital Social Worker came and met with this SW and family to discuss placement.  Eileen Stanford stated she cannot take care of Patient at home due to his combative behavior.  Hospital Social Worker agreed to start process placement for a SNF.  Eileen Stanford understands that Patient will have to revoke Hospice Medicare Benefit when he goes to a SNF and he can get a Palliative Care Consult when he gets to a facility.  Support given to family and Social Worker will follow up with [facility] Social Worker regarding placement.

## 2011-01-23 NOTE — Progress Notes (Signed)
Clinical Social Worker completed psychosocial assessment. Assessment can be found in shadow chart. Clinical Social Worker to complete FL-2 and initiate SNF search in [place]. Clinical Social Worker to follow-up with pt family in regard to bed offers. Clinical Social Worker to facilitate pt D/C needs when pt medically ready for D/C.   Jacklynn Lewis, MSW, LCSWA  Clinical Social Work [phone]

## 2011-01-23 NOTE — Progress Notes (Signed)
Physical Therapy Evaluation Patient Details Name: [name] MRN: [id number] DOB: Jul 09, 1923 Today's Date: 01/23/2011  Problem List:  Patient Active Problem List  Diagnoses  . UTI (lower urinary tract infection)  . Dysphagia  . H/O: CVA (cardiovascular accident)  . History of prostate cancer  . Atrial fibrillation  . Dementia  . Debility    Past Medical History:  Past Medical History  Diagnosis Date  . DEMENTIA   . Hypertension   . Macular degeneration disease    Past Surgical History: History reviewed. No pertinent past surgical history.  PT Assessment/Plan/Recommendation PT Assessment Clinical Impression Statement: Pt has globalized deconditioning and muscle wasting. Pt with some memory deficits so PT spoke with daughter. Daughter became very tearful over the telephone as she feels she can no longer care for her father. Pt's daughter reporting pt requires continual assistance and refuses to walk or perform many ADLs although she knows he could contribute more. Daughter reporting she has no options because they can not afford long term care. "I just don't know what to do." Daughter comforted, she is agreeable to SNF initially. Would like to communicate with SW to figure out further D/C plans.  PT Recommendation/Assessment: Patient will need skilled PT in the acute care venue PT Problem List: Decreased strength;Decreased activity tolerance;Decreased range of motion;Decreased balance;Decreased mobility;Decreased cognition;Decreased knowledge of use of DME;Decreased safety awareness;Decreased knowledge of precautions Barriers to Discharge Comments: unknown at this time.  PT Therapy Diagnosis : Difficulty walking;Abnormality of gait;Generalized weakness;Altered mental status PT Plan PT Frequency: Min 3X/week PT Treatment/Interventions: DME instruction;Gait training;Functional mobility training;Therapeutic exercise;Balance training;Neuromuscular re-education;Cognitive  remediation;Patient/family education PT Recommendation Follow Up Recommendations: Skilled nursing facility Equipment Recommended: Defer to next venue PT Goals  Acute Rehab PT Goals PT Goal Formulation: With patient Time For Goal Achievement: 2 weeks Pt will Transfer Sit to Stand/Stand to Sit: with supervision PT Transfer Goal: Sit to Stand/Stand to Sit - Progress: Progressing toward goal Pt will Transfer Bed to Chair/Chair to Bed: with supervision PT Transfer Goal: Bed to Chair/Chair to Bed - Progress: Progressing toward goal Pt will Ambulate: 1 - 15 feet;with min assist PT Goal: Ambulate - Progress: Progressing toward goal Pt will Perform Home Exercise Program: with supervision, verbal cues required/provided PT Goal: Perform Home Exercise Program - Progress: Progressing toward goal  PT Evaluation Precautions/Restrictions  Precautions Precautions: Fall Prior Functioning  Home Living Lives With: Daughter Receives Help From: Family Type of Home:  (Daughter upset - unable to obtain information yet) Bathroom Shower/Tub:  (Pt receives sponge bath) Bathroom Toilet:  (Pt uses bedside commode) Bathroom Accessibility:  (unknown at this time) Home Adaptive Equipment: Bedside commode/3-in-1;Hospital bed;Walker - rolling Additional Comments: Pt poor historian, daughter upset and anxious that she will have to have to take care of father again at D/C - not providing a lot of home information.  Prior Function Level of Independence: Needs assistance with ADLs Bath: Moderate Toileting: Moderate (Daughter reports A with transfers and that she wipes pt. ) Dressing: Moderate Feeding: Moderate Driving: No Vocation: Retired Comments: Per daughter " He won't contribute anything for [place] - he just lays there and makes [place] do it all." Cognition Cognition Arousal/Alertness: Awake/alert Overall Cognitive Status: History of cognitive impairments History of Cognitive Impairment: Appears at baseline  functioning Orientation Level: Oriented to person Cognition - Other Comments: "How did I get in a Hospital?!" "I usually walk 1-2 blocks a day" (Daughter reporting transfers only.  Sensation/Coordination Sensation Light Touch: Appears Intact (? secondary cognition) Stereognosis: Not tested  Hot/Cold: Not tested Proprioception: Not tested Coordination Gross Motor Movements are Fluid and Coordinated: Not tested Fine Motor Movements are Fluid and Coordinated: Not tested Extremity Assessment RLE Assessment RLE Assessment: Exceptions to [facility] RLE AROM (degrees) Overall AROM Right Lower Extremity: Deficits;Due to decreased strength RLE Strength RLE Overall Strength Comments: Pt with generalized decreased strength secondary to deconditioning. Grossly > 3-/5. Pt with global atrophy.  LLE Assessment LLE Assessment: Exceptions to [facility] LLE Strength LLE Overall Strength Comments: Pt with generalized decreased strength secondary to deconditioning. Grossly > 3-/5. Pt with global atrophy.  Mobility (including Balance) Bed Mobility Bed Mobility: Yes Supine to Sit: 5: Supervision Supine to Sit Details (indicate cue type and reason): for safety, verbal cues for efficiency Sitting - Scoot to Edge of Bed: 5: Supervision Sitting - Scoot to Edge of Bed Details (indicate cue type and reason): for safety Transfers Transfers: Yes Sit to Stand: 4: Min assist Sit to Stand Details (indicate cue type and reason): Verbal and tactile cues for hand placement on bed for pushoff. Assist secondary to weakness.  Stand to Sit: 4: Min assist Stand to Sit Details: Verbal cues for use of armrests and control of descent.  Stand Pivot Transfers: 4: Min assist Stand Pivot Transfer Details (indicate cue type and reason): for imbalance and weakness "I think my knees might quit on me" RW used in standing.  Ambulation/Gait Ambulation/Gait: No Stairs: No Wheelchair Mobility Wheelchair Mobility: No  Posture/Postural  Control Posture/Postural Control: Postural limitations Balance Balance Assessed: Yes Static Sitting Balance Static Sitting - Balance Support: Bilateral upper extremity supported;Feet supported Static Sitting - Level of Assistance: 5: Stand by assistance Static Sitting - Comment/# of Minutes: Approximately 5 min Static Standing Balance Static Standing - Balance Support: Bilateral upper extremity supported (on RW) Static Standing - Level of Assistance: 4: Min assist Static Standing - Comment/# of Minutes: Approximately 3-5 min Exercise    End of Session PT - End of Session Equipment Utilized During Treatment: Gait belt Activity Tolerance: Patient limited by fatigue Patient left: in chair;with call bell in reach (RN aware of pt in chair ) Nurse Communication: Mobility status for ambulation;Mobility status for transfers General Behavior During Session: [facility] for tasks performed Cognition: Impaired, at baseline (memory)  [place], Dahlia Client Carleene Mains, [place]) 01/23/2011, 1:28 PM

## 2011-01-24 MED ORDER — CIPROFLOXACIN HCL 500 MG PO TABS
500.0000 mg | ORAL_TABLET | Freq: Two times a day (BID) | ORAL | Status: DC
  Administered 2011-01-24 – 2011-01-25 (×3): 500 mg via ORAL
  Filled 2011-01-24 (×6): qty 1

## 2011-01-24 NOTE — Progress Notes (Signed)
Clinical Social Worker provided pt daughter SNF bed offers and called Hospice Social Worker to inform of bed offers. Clinical Social Worker met at [employer] with pt, pt daughter, and pt granddaughter to discuss the bed offers. Pt alert and sitting in bed and stated during discussion about SNF that he "knows his family is doing what is best for him and them". Clinical Social Worker clarified pt family financial and general questions in regard to SNF placement. Clinical Social Worker provided emotional support to patient daughter during discussion as she expressed her feelings and financial concerns about placement.  Pt daughter is interested in [facility] who has not yet responded with a bed offer. This Clinical Social Worker contacted [facility] and [facility] is reviewing pt information and will respond Friday morning. Pt daughter aware that pt will be medically ready for discharge Friday 01/25/11. Clinical Social Worker to follow-up with pt daughter tomorrow morning and discuss decision on SNF placement. Clinical Social Worker to facilitate pt D/C needs.   Jacklynn Lewis, MSW, LCSWA  Clinical Social Work [phone]

## 2011-01-24 NOTE — Progress Notes (Signed)
Clinical Child psychotherapist completed FL-2 and initiated SNF search in [place]. FL-2 placed in chart, MD please sign. Placement note can be found in chart. Clinical Social Worker to follow-up with pt daughter in regard to bed offers. Clinical Social Worker to facilitate pt D/C needs.   Jacklynn Lewis, MSW, LCSWA  Clinical Social Work [phone]

## 2011-01-24 NOTE — Progress Notes (Signed)
Rm 4506  [name] Related admission.  DNR.  Pt sitting up in bed, alert, conversational.  Pt denied any pains or complaints except for sore throat from last pm and this am and states it has subsided now.  No family present.  Per SW notes, FL2 to be distributed in [employer] and await bed offers for SNF.  Please call [facility] @ [phone] with any needs and/or disposition decisions.  Thank you. [facility] Liaison [facility]-RN

## 2011-01-24 NOTE — Progress Notes (Signed)
DAILY PROGRESS NOTE                              GENERAL INTERNAL MEDICINE [facility]  SUBJECTIVE:  Patient is sitting upright in his bed eating his breakfast. No complaints.  OBJECTIVE: BP 126/73  Pulse 90  Temp(Src) 97.6 F (36.4 C) (Axillary)  Resp 20  SpO2 97%  Intake/Output Summary (Last 24 hours) at 01/24/11 0913 Last data filed at 01/24/11 0500  Gross per 24 hour  Intake    840 ml  Output    575 ml  Net    265 ml                      Weight change:  Physical Exam: General: Sleepy not in any acute distress. HEENT: anicteric sclera, pupils equal reactive to light and accommodation CVS: S1-S2 heard, no murmur rubs or gallops Chest: clear to auscultation bilaterally, no wheezing rales or rhonchi Abdomen:  normal bowel sounds, soft, nontender, nondistended, no organomegaly Neuro: Cranial nerves II-XII intact, no focal neurological deficits Extremities: no cyanosis, no clubbing or edema noted bilaterally   Lab Results:  Basename 01/23/11 0630 01/22/11 1946 01/21/11 1530  NA 144 -- 141  K 3.7 -- 3.3*  CL 108 -- 103  CO2 26 -- 28  GLUCOSE 82 -- 109*  BUN 7 -- 12  CREATININE 0.62 0.53 --  CALCIUM 9.4 -- 9.3  MG -- -- --  PHOS -- -- --    Basename 01/23/11 0630 01/22/11 1946 01/21/11 1530  WBC 7.2 9.5 --  NEUTROABS -- -- 6.4  HGB 12.2* 12.7* --  HCT 37.6* 39.0 --  MCV 87.4 87.4 --  PLT 255 256 --    Micro Results: Recent Results (from the past 240 hour(s))  URINE CULTURE     Status: Normal   Collection Time   01/21/11  3:42 PM      Component Value Range Status Comment   Specimen Description URINE, CATHETERIZED   Final    Special Requests NONE   Final    Setup Time [id number]   Final    Colony Count >=100,000 COLONIES/ML   Final    Culture PROTEUS MIRABILIS   Final    Report Status 01/23/2011 FINAL   Final    Organism ID, Bacteria PROTEUS MIRABILIS   Final     Studies/Results: Ct Head Wo Contrast  01/21/2011  *RADIOLOGY REPORT*  IMPRESSION:  Chronic ischemic changes and atrophy.  No acute intracranial pathology.  Medications: Scheduled Meds:    . aspirin EC  81 mg Oral Daily  . cefTRIAXone (ROCEPHIN) IV  1 g Intravenous Q24H  . chlorproMAZINE  25 mg Oral QHS  . diltiazem  180 mg Oral Daily  . enoxaparin  30 mg Subcutaneous Q24H  . LORazepam  0.5 mg Oral QHS  . metoprolol  50 mg Oral BID  . potassium chloride  20 mEq Oral BID  . potassium chloride  40 mEq Oral BID  . simvastatin  20 mg Oral q1800  . DISCONTD: metroNIDAZOLE  250 mg Oral QID   Continuous Infusions:   PRN Meds:.  ASSESSMENT & PLAN: Principal Problem:  *UTI (lower urinary tract infection) Active Problems:  Dysphagia  Atrial fibrillation  Dementia  Debility  1. UTI: This is indwelling Foley catheter related UTI. The culture grew Proteus which is susceptible to ciprofloxacin. Patient is on Rocephin I will discontinue that and start him  on oral ciprofloxacin.  2. Dementia: this as being chronic problem overall patient is worsening for some time now and the family is placement as he is a functional status is worsening. Will await bone marrow the hospice team discussion with the family to determine the patient is going on for a skilled nursing facility with palliative medicine or a residential hospice  3. Dysphagia: Continue his dysphagia 2 with a thin liquids consistency that he takes at home  4. Atrial fibrillation: This is being rate controlled one beta blockers and calcium channel blockers. No use of anticoagulant. Probably because of patient's age and risk of falls.  5. Advanced directives: Patient is a DNR/DNI, he is active hospice patient. Hospice team being following during this hospital stay there approaching the family to decide about the disposition.  6. Disposition: According to the PT/OT notes patient needs to go to stuff. Patient was active hospice patient fold by hospice at home. Daughter mentions she cannot take care of him anymore because  of agitation and sometimes. Patient to go to SNF waiting on bed availability.    LOS: 3 days   Camerin Ladouceur A 01/24/2011, 9:13 AM

## 2011-01-25 LAB — BASIC METABOLIC PANEL
BUN: 7 mg/dL (ref 6–23)
Chloride: 107 mEq/L (ref 96–112)
Glucose, Bld: 121 mg/dL — ABNORMAL HIGH (ref 70–99)

## 2011-01-25 MED ORDER — CIPROFLOXACIN HCL 500 MG PO TABS: 500.0000 mg | ORAL_TABLET | Freq: Two times a day (BID) | ORAL | Status: AC

## 2011-01-25 MED ORDER — LORAZEPAM 0.5 MG PO TABS: 0.5000 mg | ORAL_TABLET | Freq: Every day | ORAL | Status: AC

## 2011-01-25 NOTE — Progress Notes (Signed)
01/25/2011 [place] Straw SPARKS [phone]       At 1200 today, I spoke via telephone with Eileen Stanford, patient's daughter. Spoke with her regarding her thoughts on what help she needed at home if she were to take her father home to care for him. Hospice aide, RN, and CSW already involved with patient's care at home. Daughter spoke at length regarding concerns of caring for patient at home vs patient being discharged to SNF for care. After conversation, daughter has decided to have patient placed at [facility].

## 2011-01-25 NOTE — Progress Notes (Signed)
Clinical Social Worker spoke with pt daughter to discuss SNF placement. Informed pt daughter that [facility] is unable to make bed offer. Clinical Child psychotherapist discussed with pt daughter the facilities that had made bed offers and patient daughter initially wanted pt to return home. After further discussion with pt daughter by this Clinical Social Worker and Sports coach, pt dtr accepted bed offer from [facility]. Pt dtr stated that she would be able to be at the hospital at 2 pm. Clinical Social Worker notified Hospice Social Worker and [facility]. Pt hospice must be revoked before pt can be discharged to SNF. Clinical Social Worker to facilitate pt D/C needs once pt hospice is revoked.   Jacklynn Lewis, MSW, LCSWA  Clinical Social Work [phone]

## 2011-01-25 NOTE — Discharge Summary (Signed)
Physician Discharge Summary  [name] MRN: [id number] DOB/AGE: [birth year] 77 y.o.  PCP: No primary provider on file.   Admit date: 01/21/2011 Discharge date: 01/25/2011  Discharge Diagnoses:   Principal Problem:  UTI (lower urinary tract infection) Active Problems:  Dysphagia  Atrial fibrillation  Dementia  Debility DO NOT RESUSCITATE   Current Discharge Medication List    START taking these medications   Details  ciprofloxacin (CIPRO) 500 MG tablet Take 1 tablet (500 mg total) by mouth 2 (two) times daily. Qty: 20 tablet, Refills: 0      CONTINUE these medications which have CHANGED   Details  !! LORazepam (ATIVAN) 0.5 MG tablet Take 1 tablet (0.5 mg total) by mouth at bedtime. Qty: 30 tablet, Refills: 0     !! - Potential duplicate medications found. Please discuss with provider.    CONTINUE these medications which have NOT CHANGED   Details  aspirin EC 81 MG tablet Take 81 mg by mouth daily.      diltiazem (DILACOR XR) 180 MG 24 hr capsule Take 180 mg by mouth daily.      !! LORazepam (ATIVAN) 0.5 MG tablet Take 0.5 mg by mouth at bedtime.      metoprolol (LOPRESSOR) 50 MG tablet Take 50 mg by mouth 2 (two) times daily.      potassium chloride SA (K-DUR,KLOR-CON) 20 MEQ tablet Take 20 mEq by mouth 2 (two) times daily.      pravastatin (PRAVACHOL) 40 MG tablet Take 40 mg by mouth daily.       !! - Potential duplicate medications found. Please discuss with provider.    STOP taking these medications     chlorproMAZINE (THORAZINE) 25 MG tablet      metroNIDAZOLE (FLAGYL) 250 MG tablet         Discharge Condition: Stable at this time  Disposition: Patient will be discharged to a skilled nursing facility where hospice will continue to follow the patient. The patient will be going to [facility]   when bed becomes available.  Diet. Dysphagia 2 diet with thin liquids  Consults:    Significant Diagnostic Studies: Ct Head Wo  Contrast  01/21/2011  *RADIOLOGY REPORT*  Clinical Data: Altered mental status  CT HEAD WITHOUT CONTRAST   IMPRESSION: Chronic ischemic changes and atrophy.  No acute intracranial pathology.  Original Report Authenticated By: Donavan Burnet, M.D.   Dg Chest Portable 1 View  01/21/2011  *RADIOLOGY REPORT*  Clinical Data: Altered mental status  PORTABLE CHEST - 1 VIEW  .  IMPRESSION: Borderline cardiomegaly without decompensation.  Chronic and postoperative changes are noted.  Original Report Authenticated By: Donavan Burnet, M.D.      Microbiology: Recent Results (from the past 240 hour(s))  URINE CULTURE     Status: Normal   Collection Time   01/21/11  3:42 PM      Component Value Range Status Comment   Specimen Description URINE, CATHETERIZED   Final    Special Requests NONE   Final    Setup Time [id number]   Final    Colony Count >=100,000 COLONIES/ML   Final    Culture PROTEUS MIRABILIS   Final    Report Status 01/23/2011 FINAL   Final    Organism ID, Bacteria PROTEUS MIRABILIS   Final      Labs: Results for orders placed during the hospital encounter of 01/21/11 (from the past 48 hour(s))  CARDIAC PANEL(CRET KIN+CKTOT+MB+TROPI)     Status: Normal  Collection Time   01/23/11 11:30 AM      Component Value Range Comment   Total CK 21  7 - 232 (U/L)    CK, MB 2.4  0.3 - 4.0 (ng/mL)    Troponin I <0.30  <0.30 (ng/mL)    Relative Index RELATIVE INDEX IS INVALID  0.0 - 2.5    BASIC METABOLIC PANEL     Status: Abnormal   Collection Time   01/25/11  6:05 AM      Component Value Range Comment   Sodium 139  135 - 145 (mEq/L)    Potassium 3.9  3.5 - 5.1 (mEq/L)    Chloride 107  96 - 112 (mEq/L)    CO2 26  19 - 32 (mEq/L)    Glucose, Bld 121 (*) 70 - 99 (mg/dL)    BUN 7  6 - 23 (mg/dL)    Creatinine, Ser 1.61  0.50 - 1.35 (mg/dL)    Calcium 8.8  8.4 - 10.5 (mg/dL)    GFR calc non Af Amer 89 (*) >90 (mL/min)    GFR calc Af Amer >90  >90 (mL/min)      HPI : This is a  77 year old male with a history of moderate dementia who has been living at home with his daughter who presented to the ER on the fifth of this month with worsening behavior over the last several days. And the family is no longer able to take care of him at home. Upon presentation the patient provided limited history and most of the history was obtained from his children. he denied any pain. He presented with a chronic indwelling Foley catheter  HOSPITAL COURSE: During this hospitalization the patient had a full workup for his underlying confusion. He had a CT scan of the head without contrast that did not show any evidence of an acute CVA. His EKG showed atrial fibrillation which was rate controlled. He is not a candidate for anticoagulation given his advanced dementia and fall risk.  UTI. The patient was found to have a urinary tract infection, initially he was started on Rocephin, his urine culture showed Proteus mirabilis sensitive to ciprofloxacin, and hence he has been switched to by mouth ciprofloxacin for another 10 days  Dysphagia. The patient had a swallow evaluation done during this admission and he will be continued on a dysphagia 2 diet with thin liquids  Dementia. Overall this seems to be at his baseline with acute worsening due to underlying urinary tract infection. The patient will continue to be followed by the hospice team at the skilled nursing facility. A PT/OT  consultation has been obtained.   Discharge Exam:  Blood pressure 103/61, pulse 72, temperature 97 F (36.1 C), temperature source Oral, resp. rate 20, SpO2 96.00%. General: Alert, awake, oriented x2, in no acute distress. HEENT: No bruits, no goiter. Heart: Irregularly irregular rate and rhythm, without murmurs, rubs, gallops. Lungs: Clear to auscultation bilaterally. Abdomen: Soft, nontender, nondistended, positive bowel sounds. Extremities: No clubbing cyanosis or edema with positive pedal pulses. Neuro: Grossly  intact, nonfocal.      Discharge Orders    Future Orders Please Complete By Expires   Diet - low sodium heart healthy      Scheduling Instructions:   Strict aspiration precautions   Increase activity slowly      Discharge instructions      Comments:   Dc to snf if bed available, dc with foley   Call MD for:  severe uncontrolled pain  Call MD for:  persistant nausea and vomiting      Call MD for:  temperature >100.4      Call MD for:  difficulty breathing, headache or visual disturbances         Follow-up Information    Follow up with pcp. Make an appointment in 7 days.         SignedRicharda Overlie 01/25/2011, 10:11 AM

## 2011-01-25 NOTE — Progress Notes (Signed)
Rm [id number]  [name]  [facility] RN Related admission.  DNR.  Pt alert, slowly eating his breakfast with his hands, plate sitting on his abdomen.  Pt complains of pain in LLQ abdomen.  Staff RN palpates golf ball size area in RLQ to midline lower abd.  TRH MD states aorta per staff RN.   No family present.  Pt awaiting placement - per SW note, awaiting approval from [facility].  With bed offer from [facility], pt will be discharged there today.  [facility] SW advised.  Please call [facility] @ [phone] when disposition finalized.  Thank you. Rocco Serene, RN  [facility] Liaison RN

## 2011-01-25 NOTE — Progress Notes (Signed)
Clinical Social Worker facilitated pt D/C needs including contacting the facility, family, Physicist, medical and arranged ambulance transportation to [facility]. No further social work needs at this time.   Jacklynn Lewis, MSW, LCSWA  Clinical Social Work [phone]

## 2011-01-25 NOTE — Progress Notes (Signed)
Physical Therapy Treatment Patient Details Name: [name] MRN: [id number] DOB: Sep 15, 1923 Today's Date: 01/25/2011  PT Assessment/Plan  PT - Assessment/Plan Comments on Treatment Session: Pt tolerated treatment well, could not ambulate further secondary to knee pain which pt reports is a chronic problem. PT to clean pt of bowel in bed, pt was aware he had gone at start of treatment.  PT Plan: Discharge plan remains appropriate PT Frequency: Min 3X/week Follow Up Recommendations: Skilled nursing facility Equipment Recommended: Defer to next venue PT Goals  Acute Rehab PT Goals PT Goal Formulation: With patient Time For Goal Achievement: 2 weeks Pt will go Supine/Side to Sit: with modified independence PT Goal: Supine/Side to Sit - Progress: Progressing toward goal Pt will go Sit to Supine/Side: with modified independence PT Goal: Sit to Supine/Side - Progress: Progressing toward goal Pt will Transfer Sit to Stand/Stand to Sit: with supervision PT Transfer Goal: Sit to Stand/Stand to Sit - Progress: Progressing toward goal Pt will Transfer Bed to Chair/Chair to Bed: with supervision PT Transfer Goal: Bed to Chair/Chair to Bed - Progress: Progressing toward goal Pt will Ambulate: 1 - 15 feet;with min assist PT Goal: Ambulate - Progress: Progressing toward goal Pt will Perform Home Exercise Program: with supervision, verbal cues required/provided PT Goal: Perform Home Exercise Program - Progress: Progressing toward goal  PT Treatment Precautions/Restrictions  Precautions Precautions: Fall Precaution Comments: Pt instructed to call for nursing when ready to get back to bed, nursing to keep an eye on pt while in chair.  Restrictions Weight Bearing Restrictions: No Mobility (including Balance) Bed Mobility Bed Mobility: Yes Rolling Right: 5: Supervision Rolling Right Details (indicate cue type and reason): verbal cues for sequence Rolling Left: 4: Min assist Rolling Left Details  (indicate cue type and reason): Tactile cues for sequence and efficiency Left Sidelying to Sit: 4: Min assist Left Sidelying to Sit Details (indicate cue type and reason): secondary to weakness, verbal and tactile cues for sequence Sitting - Scoot to Edge of Bed: 5: Supervision Transfers Transfers: Yes Sit to Stand: 4: Min assist Sit to Stand Details (indicate cue type and reason): cues to push from the bed, min assist secondary to weakness Stand to Sit: 3: Mod assist Stand to Sit Details: pt with decreased ability to control descent, "plopped" into chair Ambulation/Gait Ambulation/Gait: Yes Ambulation/Gait Assistance: 4: Min assist Ambulation/Gait Assistance Details (indicate cue type and reason): Verbal and tactile cues for upright posture and negotiation for RW Ambulation Distance (Feet): 2 Feet Assistive device: Rolling walker Gait Pattern: Trunk flexed;Shuffle (limited by Rt. Knee pain) Stairs: No Wheelchair Mobility Wheelchair Mobility: No  Static Sitting Balance Static Sitting - Balance Support: Bilateral upper extremity supported;Feet supported Static Sitting - Level of Assistance: 5: Stand by assistance (up to Mod assist for 1 loss of balance posteriorly) Exercise  General Exercises - Lower Extremity Ankle Circles/Pumps: AROM;Both;15 reps;Seated Long Arc Quad: AROM;Strengthening;Both;20 reps;Seated (manually resisted. ) Hip Flexion/Marching: AROM;Strengthening;Both;10 reps;Seated (manually resisted) End of Session PT - End of Session Equipment Utilized During Treatment: Gait belt Activity Tolerance: Patient tolerated treatment well;Patient limited by fatigue Patient left: in chair;with call bell in reach Nurse Communication: Mobility status for transfers;Mobility status for ambulation General Behavior During Session: [facility] for tasks performed Cognition: Impaired, at baseline (improved today)  Beverely Pace, Dahlia Client Carleene Mains, Charnika Herbst) 01/25/2011, 3:23 PM

## 2011-01-28 ENCOUNTER — Encounter: Payer: Self-pay | Admitting: Cardiology

## 2011-02-04 ENCOUNTER — Encounter: Payer: Self-pay | Admitting: Cardiology

## 2011-02-04 ENCOUNTER — Other Ambulatory Visit: Payer: Self-pay | Admitting: Podiatry

## 2011-02-04 DIAGNOSIS — L97509 Non-pressure chronic ulcer of other part of unspecified foot with unspecified severity: Secondary | ICD-10-CM

## 2011-02-11 ENCOUNTER — Other Ambulatory Visit: Payer: Self-pay

## 2011-02-13 ENCOUNTER — Other Ambulatory Visit: Payer: Self-pay | Admitting: Cardiology

## 2011-02-13 DIAGNOSIS — L97909 Non-pressure chronic ulcer of unspecified part of unspecified lower leg with unspecified severity: Secondary | ICD-10-CM

## 2011-02-14 ENCOUNTER — Other Ambulatory Visit: Payer: Self-pay | Admitting: Orthopedic Surgery

## 2011-02-14 ENCOUNTER — Encounter (INDEPENDENT_AMBULATORY_CARE_PROVIDER_SITE_OTHER): Admitting: Cardiology

## 2011-02-14 ENCOUNTER — Encounter: Payer: Self-pay | Admitting: Cardiology

## 2011-02-14 DIAGNOSIS — L97909 Non-pressure chronic ulcer of unspecified part of unspecified lower leg with unspecified severity: Secondary | ICD-10-CM

## 2011-02-14 DIAGNOSIS — I739 Peripheral vascular disease, unspecified: Secondary | ICD-10-CM

## 2011-02-15 ENCOUNTER — Ambulatory Visit
Admission: RE | Admit: 2011-02-15 | Discharge: 2011-02-15 | Disposition: A | Discharge status: Home / Self Care | Payer: Medicare Other | Source: Ambulatory Visit | Attending: Podiatry | Admitting: Podiatry

## 2011-02-15 DIAGNOSIS — L97509 Non-pressure chronic ulcer of other part of unspecified foot with unspecified severity: Secondary | ICD-10-CM

## 2011-02-15 MED ORDER — GADOBENATE DIMEGLUMINE 529 MG/ML IV SOLN
15.0000 mL | Freq: Once | INTRAVENOUS | Status: AC | PRN
  Administered 2011-02-15: 15 mL via INTRAVENOUS

## 2011-02-26 ENCOUNTER — Encounter: Payer: Self-pay | Admitting: Internal Medicine

## 2011-02-26 ENCOUNTER — Ambulatory Visit (INDEPENDENT_AMBULATORY_CARE_PROVIDER_SITE_OTHER): Payer: Medicare Other | Admitting: Internal Medicine

## 2011-02-26 VITALS — BP 114/74 | HR 108 | Temp 97.3°F | Wt 131.0 lb

## 2011-02-26 DIAGNOSIS — M869 Osteomyelitis, unspecified: Secondary | ICD-10-CM

## 2011-02-26 LAB — COMPREHENSIVE METABOLIC PANEL
ALT: 18 U/L (ref 0–53)
AST: 25 U/L (ref 0–37)
Albumin: 3.8 g/dL (ref 3.5–5.2)
Alkaline Phosphatase: 148 U/L — ABNORMAL HIGH (ref 39–117)
BUN: 13 mg/dL (ref 6–23)
Chloride: 100 mEq/L (ref 96–112)
Potassium: 4.7 mEq/L (ref 3.5–5.3)
Sodium: 136 mEq/L (ref 135–145)

## 2011-02-26 LAB — CBC WITH DIFFERENTIAL/PLATELET
Basophils Absolute: 0 10*3/uL (ref 0.0–0.1)
Lymphocytes Relative: 22 % (ref 12–46)
Lymphs Abs: 1.9 10*3/uL (ref 0.7–4.0)
Neutro Abs: 5.8 10*3/uL (ref 1.7–7.7)
Neutrophils Relative %: 69 % (ref 43–77)
Platelets: 359 10*3/uL (ref 150–400)
RBC: 3.99 MIL/uL — ABNORMAL LOW (ref 4.22–5.81)
RDW: 15.6 % — ABNORMAL HIGH (ref 11.5–15.5)
WBC: 8.5 10*3/uL (ref 4.0–10.5)

## 2011-02-26 LAB — C-REACTIVE PROTEIN: CRP: 0.87 mg/dL — ABNORMAL HIGH (ref <0.60)

## 2011-02-26 LAB — SEDIMENTATION RATE: Sed Rate: 18 mm/hr — ABNORMAL HIGH (ref 0–16)

## 2011-02-27 NOTE — Progress Notes (Addendum)
Subjective:    Patient ID: [name], male    DOB: 11-Jul-1923, 77 y.o.   MRN: [id number]  HPI Mr. Zweber is a frail octogenarian who has history of afib, dementia, recurrent UTI, recently hospitalized in November for proteus mirabilis complicated UTI and given ciprofloxacin. He was discharged to a SNF for rehabilitation but developed recurrent c.difficile infection. He is known to have a 1-2 yr left great toe lesion complicated by onychomycosis which Dr. Wynelle Cleveland has been managing. Recently the patient had his toe nail partially removed, and the family reports "visible bone with infection". A culture was send on 11/19 which subsequently identified MSSA( S to oxacillin, bactrim, doxy, cipro, vanco). The patient was empirically started on augmentin and bactrim to cover polymicrobial and MRSA. Unclear if patient had increased diarrhea due to these antibiotics, but he was started again on po vancomycin. Today the patient is no longer on oral vancomycin. He reports only 1-2 stools per day no longer having diarrhea. He has had significant deconditioning over the past 8 months due to 60 pound weight loss (BL weight of 178), thought to be due to CDI. He has gained 7 lbs in the last month, going from 124 to 131. Previously to his hospitalization in November he was cared for by his son and daughter.   The patient denies fever, chills, nightsweats, does have occasional toe pain, no throbbing, no rash to lower extremity, he still has some appetite, but usually eats several meals per day, not just 3 as given at [facility]. He reports having fatigue, decreased energy  Patient Active Problem List  Diagnoses  . IMPAIRED GLUCOSE TOLERANCE  . HYPERCHOLESTEROLEMIA-PURE  . MILD COGNITIVE IMPAIRMENT SO STATED  . CERUMEN IMPACTION  . ACUTE SEROUS OTITIS MEDIA  . HYPERTENSION  . CARDIOMYOPATHY  . FIBRILLATION, ATRIAL  . CAROTID ARTERY STENOSIS  . RENAL ARTERY STENOSIS  . ANEURYSM, ABDOMINAL AORTIC  . COPD  .  OSTEOARTHRITIS  . HIP PAIN, LEFT  . LOW BACK PAIN SYNDROME  . DIARRHEA  . PROSTATE CANCER, HX OF  . HYPERTENSION, BENIGN  . Encounter for long-term (current) use of anticoagulants  . UTI (lower urinary tract infection)  . Dysphagia  . H/O: CVA (cardiovascular accident)  . History of prostate cancer  . Atrial fibrillation  . Dementia  . Debility    Current Outpatient Prescriptions  Medication Sig Dispense Refill  . aspirin EC 81 MG tablet Take 81 mg by mouth daily.        . Diclofenac Sodium 3 % GEL Place 1 application onto the skin 2 (two) times daily.  12 Tube  6  . diltiazem (CARDIZEM CD) 180 MG 24 hr capsule Take 1 capsule (180 mg total) by mouth daily.  30 capsule  12  . diltiazem (DILACOR XR) 180 MG 24 hr capsule Take 180 mg by mouth daily.        Marland Kitchen levalbuterol (XOPENEX) 0.63 MG/3ML nebulizer solution Take 3 mLs (0.63 mg total) by nebulization every 4 (four) hours as needed for wheezing.  120 mL  2  . LORazepam (ATIVAN) 0.5 MG tablet Take 0.5 mg by mouth at bedtime.        . metoprolol (LOPRESSOR) 50 MG tablet Take 1 tablet (50 mg total) by mouth 2 (two) times daily.  180 tablet  2  . metoprolol (LOPRESSOR) 50 MG tablet Take 50 mg by mouth 2 (two) times daily.        . potassium chloride SA (K-DUR,KLOR-CON) 20 MEQ tablet  Take 1 tablet (20 mEq total) by mouth 2 (two) times daily.  180 tablet  3  . potassium chloride SA (K-DUR,KLOR-CON) 20 MEQ tablet Take 20 mEq by mouth 2 (two) times daily.        . pravastatin (PRAVACHOL) 40 MG tablet Take 40 mg by mouth daily.        . pravastatin (PRAVACHOL) 40 MG tablet Take 40 mg by mouth daily.         No Known Allergies  SH: NH resident. Previously cared by his son and daughter. No smoking or alcohol use. A retired [employer]. FH: Family History  Problem Relation Age of Onset  . Heart attack Father      Review of Systems  Constitutional: Negative for fever, chills, diaphoresis, Positive for activity change, appetite  change, fatigue and unexpected weight change.  HENT: Negative for congestion, sore throat, rhinorrhea, sneezing, trouble swallowing and sinus pressure.  Eyes: Negative for photophobia and visual disturbance.  Respiratory: Negative for cough, chest tightness, shortness of breath, wheezing and stridor.  Cardiovascular: Negative for chest pain, palpitations and leg swelling.  Gastrointestinal: Negative for nausea, vomiting, abdominal pain,  constipation, blood in stool, abdominal distention and anal bleeding. Only has 1-2 BM perday Genitourinary: Negative for dysuria, hematuria, flank pain and difficulty urinating.  Musculoskeletal: Negative for myalgias, back pain, joint swelling, arthralgias and gait problem.  Skin: Negative for color change, pallor, rash and wound.  Neurological: Negative for dizziness, tremors, weakness and light-headedness.  Hematological: Negative for adenopathy. Does not bruise/bleed easily.  Psychiatric/Behavioral: Negative for behavioral problems, confusion, sleep disturbance, dysphoric mood, decreased concentration and agitation.       Objective:   Physical Exam  BP 114/74  Pulse 108  Temp(Src) 97.3 F (36.3 C) (Oral)  Wt 131 lb (59.421 kg)  General Appearance:    Alert, cooperative, no distress, appears stated age. Frail and chronically ill appearing, gaunt. Malnourished appearing  Head:    Normocephalic, without obvious abnormality, atraumatic. Bitemporal wasting. Scattered SK lesions on scalp.  Eyes:    PERRL, conjunctiva/corneas clear, EOM's intact, fundi    benign, both eyes       Ears:    Normal TM's and external ear canals, both ears  Nose:   Nares normal, septum midline, mucosa normal, no drainage   or sinus tenderness  Throat:   Lips, mucosa, and tongue normal; teeth and gums normal  Neck:   Supple, symmetrical, trachea midline, no adenopathy;       thyroid:  No enlargement/tenderness/nodules; no carotid   bruit or JVD     Lungs:     Clear to  auscultation bilaterally, respirations unlabored     Heart:    Regular rate and rhythm, S1 and S2 normal, no murmur, rub   or gallop  Abdomen:     Scaphoid, oft, non-tender, bowel sounds active all four quadrants        Extremities:   Left great toe has nail bed that is thickened but only has partial toe nail c/w hx of removal. No fluctuance no pain on palpation. No exudate. 2nd toe has thickened nailbed c/w onychomycosis  Pulses:   2+ and symmetric all extremities  Skin:   No petecchaie, no splinter hemorrhages, no janeways or osler nodes  Lymph nodes:   Cervical, supraclavicular, and axillary nodes normal     Labs: CBC    Component Value Date/Time   WBC 8.5 02/26/2011 1050   RBC 3.99* 02/26/2011 1050   HGB 11.7*  02/26/2011 1050   HCT 35.8* 02/26/2011 1050   PLT 359 02/26/2011 1050   MCV 89.7 02/26/2011 1050   MCH 29.3 02/26/2011 1050   MCHC 32.7 02/26/2011 1050   RDW 15.6* 02/26/2011 1050   LYMPHSABS 1.9 02/26/2011 1050   MONOABS 0.7 02/26/2011 1050   EOSABS 0.1 02/26/2011 1050   BASOSABS 0.0 02/26/2011 1050   CMP     Component Value Date/Time   NA 136 02/26/2011 1050   K 4.7 02/26/2011 1050   CL 100 02/26/2011 1050   CO2 26 02/26/2011 1050   GLUCOSE 98 02/26/2011 1050   BUN 13 02/26/2011 1050   CREATININE 0.75 02/26/2011 1050   CREATININE 0.60 01/25/2011 0605   CALCIUM 9.6 02/26/2011 1050   PROT 6.4 02/26/2011 1050   ALBUMIN 3.8 02/26/2011 1050   AST 25 02/26/2011 1050   ALT 18 02/26/2011 1050   ALKPHOS 148* 02/26/2011 1050   BILITOT 0.3 02/26/2011 1050   GFRNONAA 89* 01/25/2011 0605   GFRAA >90 01/25/2011 0605     Erythrocyte Sedimentation Rate     Component Value Date/Time   ESRSEDRATE 18* 02/26/2011 1050   C-Reactive Protein     Component Value Date/Time   CRP 0.87* 02/26/2011 1050     Old Imaging: MRI of Left great toe 02/15/11: there is diffuse enhancement and edema in the distal phalanx of the great toe, suspicious for osteomyelitis. The  overyling nail appears thickened. There is mild enhancement in the adjacent subcutaneous tissues, without a drainable abscess.   Old MICRO: 02/04/11: left hallux abscess : + Methicillin Sensitive Staph Aureus - oxacillin S, bactrim < 10, tetracycline <1, cipro<0.5, vanco 1  01/21/11: urine Cx: + proteus mirabilis (Sensitive to CTX, cipro, bactrim) 09/2010: urine Cx: + enterococcus (sensitive to ampicillin)     Assessment & Plan:  77 yo Male with left great toe onychomycosis s/p partial nail removal c/b deep tissue infection vs. Osteomyelitis. MRI suggestive of osteomyelitis but inflammatory markers are not elevated. Patient has had repeated episodes of c.difficile colitis in the last 8 months with 60 lb weight loss.  At this visit, we have drawn the following labs:cbc with diff, esr, crp and CMP.  Will talk to radiologist and his podiatrist of their findings.  After discussion with radiologist, the MRI is worrisome for osteomyelitis since enhancement is noted throughout the entire phalynx not just distally which you would expect reactive changes from recent nail removal. I will discuss with his podiatrist it is worth getting biopsy to see if any other pathogen other than MSSA isolated, as to I am unclear to where the culture from 11/19 was collected (superficial vs. Deep abscess)  If consistent with osteo, we will need to treat with 6 wks of IV antibiotics, cefazolin 2gm Q8hr or ceftriaxone 2gm Q24hr. My fear is that the patient will have a recurrent episode of c.difficile colitis with this next course of antibiotics which carries significant morbidity and risk for this patient. I would re-institute empiric oral vancomycin for the patient plus/minus fecal bacteriotherapy  For his nutritional status, I recommend that he gets additional ensure protein drinks 3 times per day, to help increase weight gain (to get back to his baseline weight).

## 2011-03-01 ENCOUNTER — Other Ambulatory Visit: Payer: Self-pay | Admitting: Internal Medicine

## 2011-03-01 ENCOUNTER — Other Ambulatory Visit (HOSPITAL_COMMUNITY): Payer: Self-pay | Admitting: *Deleted

## 2011-03-01 DIAGNOSIS — M869 Osteomyelitis, unspecified: Secondary | ICD-10-CM

## 2011-03-04 ENCOUNTER — Telehealth: Payer: Self-pay | Admitting: *Deleted

## 2011-03-04 NOTE — Telephone Encounter (Signed)
Spoke with patients nurse who advised that the PICC has already been set up and that they will infuse patient in house after he gets it done. She did ask that we fax her the order and note as she had no idea what meds the patient will be on. Got fax number [phone] and [phone]. Nurse name is Reunion.

## 2011-03-06 ENCOUNTER — Ambulatory Visit (HOSPITAL_COMMUNITY)
Admission: RE | Admit: 2011-03-06 | Discharge: 2011-03-06 | Disposition: A | Discharge status: Home / Self Care | Payer: Medicare Other | Source: Ambulatory Visit | Attending: Internal Medicine | Admitting: Internal Medicine

## 2011-03-06 ENCOUNTER — Other Ambulatory Visit: Payer: Self-pay | Admitting: Internal Medicine

## 2011-03-06 ENCOUNTER — Encounter (HOSPITAL_COMMUNITY): Payer: Self-pay

## 2011-03-06 DIAGNOSIS — M869 Osteomyelitis, unspecified: Secondary | ICD-10-CM

## 2011-03-06 MED ORDER — DEXTROSE 5 % IV SOLN: 2.0000 g | Freq: Three times a day (TID) | INTRAVENOUS | Status: DC

## 2011-03-06 MED ORDER — CEFAZOLIN SODIUM-DEXTROSE 2-3 GM-% IV SOLR
2.0000 g | Freq: Once | INTRAVENOUS | Status: AC
  Administered 2011-03-06: 2 g via INTRAVENOUS
  Filled 2011-03-06: qty 50

## 2011-03-06 MED ORDER — SODIUM CHLORIDE 0.9 % IV SOLN: INTRAVENOUS | Status: DC

## 2011-03-06 MED ORDER — HEPARIN SOD (PORK) LOCK FLUSH 100 UNIT/ML IV SOLN
250.0000 [IU] | Freq: Once | INTRAVENOUS | Status: AC
  Administered 2011-03-06: 250 [IU] via INTRAVENOUS

## 2011-03-06 MED ORDER — HEPARIN SOD (PORK) LOCK FLUSH 100 UNIT/ML IV SOLN
INTRAVENOUS | Status: AC
  Administered 2011-03-06: 250 [IU] via INTRAVENOUS
  Filled 2011-03-06: qty 5

## 2011-03-06 NOTE — Procedures (Signed)
RUE PICC 45cm SVC RA

## 2011-03-06 NOTE — Procedures (Signed)
R PICC placed Tip SVC RA

## 2011-03-13 ENCOUNTER — Encounter: Payer: Self-pay | Admitting: Cardiology

## 2011-04-01 ENCOUNTER — Telehealth: Payer: Self-pay | Admitting: *Deleted

## 2011-04-01 DIAGNOSIS — L02619 Cutaneous abscess of unspecified foot: Secondary | ICD-10-CM

## 2011-04-01 DIAGNOSIS — A4901 Methicillin susceptible Staphylococcus aureus infection, unspecified site: Secondary | ICD-10-CM

## 2011-04-01 DIAGNOSIS — L03039 Cellulitis of unspecified toe: Secondary | ICD-10-CM

## 2011-04-01 DIAGNOSIS — R269 Unspecified abnormalities of gait and mobility: Secondary | ICD-10-CM

## 2011-04-01 DIAGNOSIS — M6281 Muscle weakness (generalized): Secondary | ICD-10-CM

## 2011-04-01 NOTE — Telephone Encounter (Signed)
Noted; continue same

## 2011-04-01 NOTE — Telephone Encounter (Signed)
Home Health wanted Dr Kirtland Bouchard to know pt has a productive cough with no color or fever.  He is on Cephalexin from the Infectious Disease MD for a cellulitis of his toe.

## 2011-04-01 NOTE — Telephone Encounter (Signed)
Attempt to call- please try - ??phone # - rings busy but sounds unusal.

## 2011-04-01 NOTE — Telephone Encounter (Signed)
Left message on RN's voice mail with Dr. Charm Rings recommendations.

## 2011-04-02 ENCOUNTER — Ambulatory Visit: Payer: Self-pay | Admitting: Pharmacist

## 2011-04-02 DIAGNOSIS — Z7901 Long term (current) use of anticoagulants: Secondary | ICD-10-CM

## 2011-04-02 DIAGNOSIS — I4891 Unspecified atrial fibrillation: Secondary | ICD-10-CM

## 2011-04-03 ENCOUNTER — Telehealth: Payer: Self-pay | Admitting: Family Medicine

## 2011-04-03 NOTE — Telephone Encounter (Signed)
Crystal from [facility] would like PT orders that extend past this week. One more week please. Please call her.

## 2011-04-03 NOTE — Telephone Encounter (Signed)
Gave VO for PT to continue

## 2011-04-05 ENCOUNTER — Ambulatory Visit: Payer: Self-pay | Admitting: Internal Medicine

## 2011-04-15 ENCOUNTER — Telehealth: Payer: Self-pay | Admitting: *Deleted

## 2011-04-15 ENCOUNTER — Telehealth: Payer: Self-pay

## 2011-04-15 NOTE — Telephone Encounter (Signed)
[facility] RN wondering when pt's IV ABX is to complete and when [facility] can be removed.  RN reviewed MD note.  Pt was to return for f/u visit w/ Dr. Drue Second by mid-January.  Pt's daughter connected with scheduler to make f/u appt.  Appt make for 04/18/11 w/ Dr. Drue Second.

## 2011-04-15 NOTE — Telephone Encounter (Signed)
Per [facility] Nurse pt has 2+ edema on right foot and ankle.  This was first noticed on 04/14/11.  Per pt's family pt has a knee brace on extremity.  Per Nurse no redness or warmth noted and pt has foot elevated and has not had any complaints.

## 2011-04-16 NOTE — Telephone Encounter (Signed)
Noted; rov if worsens or desired

## 2011-04-16 NOTE — Telephone Encounter (Signed)
Per daughter swelling is down some and ankle is visible.  Appt to see infectious disease on Thursday.  Per daughter she will make a follow up appointment with Dr. Amador Cunas.

## 2011-04-18 ENCOUNTER — Ambulatory Visit (INDEPENDENT_AMBULATORY_CARE_PROVIDER_SITE_OTHER): Payer: Medicare Other | Admitting: Internal Medicine

## 2011-04-18 ENCOUNTER — Encounter: Payer: Self-pay | Admitting: Internal Medicine

## 2011-04-18 VITALS — BP 79/49 | HR 71 | Ht 74.0 in | Wt 138.2 lb

## 2011-04-18 DIAGNOSIS — Z0000 Encounter for general adult medical examination without abnormal findings: Secondary | ICD-10-CM

## 2011-04-18 DIAGNOSIS — M869 Osteomyelitis, unspecified: Secondary | ICD-10-CM

## 2011-04-18 LAB — CBC WITH DIFFERENTIAL/PLATELET
Basophils Absolute: 0 10*3/uL (ref 0.0–0.1)
Basophils Relative: 0 % (ref 0–1)
HCT: 33.4 % — ABNORMAL LOW (ref 39.0–52.0)
Lymphocytes Relative: 13 % (ref 12–46)
MCHC: 32 g/dL (ref 30.0–36.0)
Neutro Abs: 8.3 10*3/uL — ABNORMAL HIGH (ref 1.7–7.7)
Neutrophils Relative %: 75 % (ref 43–77)
RDW: 15.6 % — ABNORMAL HIGH (ref 11.5–15.5)
WBC: 11 10*3/uL — ABNORMAL HIGH (ref 4.0–10.5)

## 2011-04-18 NOTE — Progress Notes (Signed)
INFECTIOUS DISEASE CLINIC  RFV: follow up after 6 wks of IV antibiotics  Subjective:    Patient ID: [name], male    DOB: [birth year], 78 y.o.   MRN: [id number]  HPI [name] is an 78 yo Male who has history of afib, dementia, recurrent UTI, recently hospitalized in November for proteus mirabilis complicated UTI and given ciprofloxacin. He was discharged to a SNF for rehabilitation but developed recurrent c.difficile infection. He is known to have a 1-2 yr left great toe lesion complicated by onychomycosis which Dr. Wynelle Cleveland has been managing. This past December, he had his toe nail partially removed, and the family reports "visible bone with infection".  Patient had an  MRI that was worrisome for osteomyelitis since enhancement is noted throughout the entire phalynx not just distally which you would expect reactive changes from recent nail removal.  culture showed MSSA isolated, however it is unclear to where the culture from 11/19 was collected (superficial vs. Deep abscess). The patient has finished 6 wks of IV ceftriaxone today and he reports doing well. His toe has healed well and does not hurt anymore. He states that he has not had any difficulty with his PICC line. No pain, no erythema. He denies diarrhea. He has been eating well and has had some modest increase in weight since being at home, but not back to baseline. Patient has been ambulating with walker, today has gone from parking lot to clinic, to bathroom and back, without difficulty. Otherwise he denies fever/chills/nightsweats. No dysuria/ no N/V/Diarrhea/ABd pain/ GERD/Chest pain/ good appetite, numerous small meals per day.  Active Ambulatory Problems    Diagnosis Date Noted  . IMPAIRED GLUCOSE TOLERANCE 04/19/2009  . HYPERCHOLESTEROLEMIA-PURE 08/09/2008  . MILD COGNITIVE IMPAIRMENT SO STATED 04/19/2009  . CERUMEN IMPACTION 09/28/2009  . ACUTE SEROUS OTITIS MEDIA 08/03/2007  . HYPERTENSION 08/26/2006  . CARDIOMYOPATHY  08/26/2006  . FIBRILLATION, ATRIAL 05/10/2008  . CAROTID ARTERY STENOSIS 08/09/2008  . RENAL ARTERY STENOSIS 08/26/2006  . ANEURYSM, ABDOMINAL AORTIC 08/26/2006  . COPD 08/26/2006  . OSTEOARTHRITIS 04/19/2009  . HIP PAIN, LEFT 12/16/2007  . LOW BACK PAIN SYNDROME 12/16/2007  . DIARRHEA 08/03/2007  . PROSTATE CANCER, HX OF 08/26/2006  . HYPERTENSION, BENIGN 02/10/2009  . Encounter for long-term (current) use of anticoagulants 06/05/2010  . UTI (lower urinary tract infection) 01/22/2011  . Dysphagia 01/22/2011  . H/O: CVA (cardiovascular accident) 01/22/2011  . History of prostate cancer 01/22/2011  . Atrial fibrillation 01/22/2011  . Dementia 01/22/2011  . Debility 01/22/2011   Resolved Ambulatory Problems    Diagnosis Date Noted  . No Resolved Ambulatory Problems   Past Medical History  Diagnosis Date  . AAA (abdominal aortic aneurysm)   . Carotid stenosis   . Hypercholesteremia   . Cardiomyopathy   . Renal artery stenosis   . COPD (chronic obstructive pulmonary disease)   . Osteoarthritis   . Prostate cancer   . Atrial flutter   . Hip fracture   . DEMENTIA   . Hypertension   . Macular degeneration disease    Prior to Admission medications   Medication Sig Start Date End Date Taking? Authorizing Provider  aspirin EC 81 MG tablet Take 81 mg by mouth daily.     Yes Historical Provider, MD  Diclofenac Sodium 3 % GEL Place 1 application onto the skin 2 (two) times daily. 07/03/10  Yes Rogelia Boga, MD  diltiazem (CARDIZEM CD) 180 MG 24 hr capsule Take 1 capsule (180 mg total) by  mouth daily. 06/18/10  Yes Wendall Stade, MD  diltiazem (DILACOR XR) 180 MG 24 hr capsule Take 180 mg by mouth daily.     Yes Historical Provider, MD  levalbuterol (XOPENEX) 0.63 MG/3ML nebulizer solution Take 3 mLs (0.63 mg total) by nebulization every 4 (four) hours as needed for wheezing. 10/15/10 10/15/11 Yes Rogelia Boga, MD  metoprolol (LOPRESSOR) 50 MG tablet Take 1 tablet  (50 mg total) by mouth 2 (two) times daily. 06/08/10  Yes Rogelia Boga, MD  metoprolol (LOPRESSOR) 50 MG tablet Take 50 mg by mouth 2 (two) times daily.     Yes Historical Provider, MD  potassium chloride SA (K-DUR,KLOR-CON) 20 MEQ tablet Take 1 tablet (20 mEq total) by mouth 2 (two) times daily. 11/06/10  Yes Rogelia Boga, MD  potassium chloride SA (K-DUR,KLOR-CON) 20 MEQ tablet Take 20 mEq by mouth 2 (two) times daily.     Yes Historical Provider, MD  pravastatin (PRAVACHOL) 40 MG tablet Take 40 mg by mouth daily.     Yes Historical Provider, MD  pravastatin (PRAVACHOL) 40 MG tablet Take 40 mg by mouth daily.     Yes Historical Provider, MD  dextrose 5 % SOLN 50 mL with ceFAZolin 1 G SOLR 2 g ivpb Inject 2 g into the vein every 8 (eight) hours. 03/06/11   Judyann Munson, MD  LORazepam (ATIVAN) 0.5 MG tablet Take 0.5 mg by mouth at bedtime.      Historical Provider, MD     Review of Systems Per hpi. i have reviewed 10 systems, non contributory, except for weight loss he sustained last summer/fall 2012.    Objective:   Physical Exam Physical Exam  Constitutional: He is oriented to person, place, only. Slightly unkept, chronically ill-appearing. No distress.  HENT:  Mouth/Throat: Oropharynx is clear and moist. No oropharyngeal exudate.  Cardiovascular: Normal rate, regular rhythm and normal heart sounds. Exam reveals no gallop and no friction rub.  No murmur heard.  Pulmonary/Chest: Effort normal and breath sounds normal. No respiratory distress. He has no wheezes.  Abdominal: Soft. Bowel sounds are normal. He exhibits no distension. There is no tenderness.  Lymphadenopathy:  He has no cervical adenopathy.  Neurological: He is alert and oriented to person, place, and time.  Skin: Skin is warm and dry. No rash noted. No erythema.  Psychiatric: He has a normal mood and affect. His behavior is normal.    Labs: CBC    Component Value Date/Time   WBC 11.0* 04/18/2011  1200   RBC 3.74* 04/18/2011 1200   HGB 10.7* 04/18/2011 1200   HCT 33.4* 04/18/2011 1200   PLT 226 04/18/2011 1200   MCV 89.3 04/18/2011 1200   MCH 28.6 04/18/2011 1200   MCHC 32.0 04/18/2011 1200   RDW 15.6* 04/18/2011 1200   LYMPHSABS 1.4 04/18/2011 1200   MONOABS 1.2* 04/18/2011 1200   EOSABS 0.1 04/18/2011 1200   BASOSABS 0.0 04/18/2011 1200    Lab Results  Component Value Date   ESRSEDRATE 13 04/18/2011   Lab Results  Component Value Date   CRP 6.76* 04/18/2011        Assessment & Plan:   MSSA osteomyelitis of lower extremity digit = will discontinue picc line today and check CBC, BMP, ESR, CRP to decide if needs further chronic suppression.  Addendum: Labs reveal leukocytosis and elevated CRP but normal sed rate. Will not restart antibiotics for osteomyelitis since ESR is normalized. Will see if patient has symptoms for any other recent  infection to explain elevated wbc and crp.  Family called into clinic stating that he had urinary urgency/frequency in addition to combativeness. Concerning for uti in the elderly. Our clinic recommended that patient goes to see PCP vs. ED.

## 2011-04-18 NOTE — Progress Notes (Signed)
Lab draw from PICC: Pt. identified w/ name and DOB. Donned gloves. Clean tubing/hub connection cap with CHG wipe for 20 seconds, using scrubbing motion. Attached empty, sterile 10 cc syringe, opened clamp, withdrew 10 cc of waste and set aside. Attached next syringe and withrew 20 cc of blood, transferred to lab tubes. Order to remove PICC line obtained from Dr. Drue Second. Pt. identified with name and date of birth. PICC dressing removed, site unremarkable. Sutures removed. PICC line removed using sterile procedure @ 1145 pm. PICC length equal to that noted in pt's hospital chart of 45 cm. Sterile petroleum gauze + sterile 4X4 applied to PICC site, pressure applied for 10 minutes and covered with Medipore tape as a pressure dressing. Pt. instructed to limit use of arm for 1 hour. Pt. instructed that the pressure dressing should remain in place for 24 hours. Pt. verablized understanding of these instructions.

## 2011-04-19 ENCOUNTER — Other Ambulatory Visit: Payer: Self-pay | Admitting: Internal Medicine

## 2011-04-19 LAB — BASIC METABOLIC PANEL
Chloride: 101 mEq/L (ref 96–112)
Potassium: 4.4 mEq/L (ref 3.5–5.3)
Sodium: 138 mEq/L (ref 135–145)

## 2011-04-19 LAB — C-REACTIVE PROTEIN: CRP: 6.76 mg/dL — ABNORMAL HIGH (ref <0.60)

## 2011-04-19 LAB — SEDIMENTATION RATE: Sed Rate: 13 mm/hr (ref 0–16)

## 2011-04-22 ENCOUNTER — Telehealth: Payer: Self-pay | Admitting: *Deleted

## 2011-04-22 ENCOUNTER — Encounter: Payer: Self-pay | Admitting: Internal Medicine

## 2011-04-22 ENCOUNTER — Ambulatory Visit (INDEPENDENT_AMBULATORY_CARE_PROVIDER_SITE_OTHER): Payer: Medicare Other | Admitting: Internal Medicine

## 2011-04-22 VITALS — BP 106/70 | HR 40 | Temp 97.4°F | Resp 20

## 2011-04-22 DIAGNOSIS — I4891 Unspecified atrial fibrillation: Secondary | ICD-10-CM

## 2011-04-22 DIAGNOSIS — I10 Essential (primary) hypertension: Secondary | ICD-10-CM

## 2011-04-22 DIAGNOSIS — N390 Urinary tract infection, site not specified: Secondary | ICD-10-CM

## 2011-04-22 DIAGNOSIS — I428 Other cardiomyopathies: Secondary | ICD-10-CM

## 2011-04-22 DIAGNOSIS — F0390 Unspecified dementia without behavioral disturbance: Secondary | ICD-10-CM

## 2011-04-22 LAB — POCT URINALYSIS DIPSTICK
Bilirubin, UA: NEGATIVE
Blood, UA: NEGATIVE
Glucose, UA: NEGATIVE
Leukocytes, UA: NEGATIVE
Nitrite, UA: NEGATIVE
Urobilinogen, UA: 0.2

## 2011-04-22 MED ORDER — RISPERIDONE 0.25 MG PO TABS: 0.2500 mg | ORAL_TABLET | Freq: Two times a day (BID) | ORAL | Status: DC

## 2011-04-22 NOTE — Telephone Encounter (Signed)
Rele[facility] from [facility] - pt is having hallucinations and is now off antibioitc---- she is not sure which md to call or who to contact--please advise

## 2011-04-22 NOTE — Progress Notes (Signed)
Subjective:    Patient ID: [name], male    DOB: Aug 09, 1923, 78 y.o.   MRN: [id number]  HPI  78 year old patient who is seen today for followup. He has not had an outpatient visit in quite some time. He was hospitalized late last year for osteomyelitis involving the left great toe. He had a brief hospital admission and was transferred to [facility] for 8 weeks. He has been home since January 3. He is cared for by his daughter and son-in-law. The patient has had a history of dementia which was much worse when he was ill and hospitalized. He continues to have intermittent confusion with delusional thinking. Presently he is on low-dose Risperdal.  In general he does reasonably well but does require 24/7 assistance with all aspects of daily living. He has chronic nature fibrillation the Coumadin has been discontinued due to high fall risk. He uses a walker for ambulation but often neglects to use properly. He has COPD and is on home nebulizer treatments. He is on diltiazem as well as metoprolol for blood pressure and rate control   Review of Systems  Constitutional: Negative for fever, chills, appetite change and fatigue. Unexpected weight change: there has been a recent 9 pound weight improvement.  HENT: Negative for hearing loss, ear pain, congestion, sore throat, trouble swallowing, neck stiffness, dental problem, voice change and tinnitus.   Eyes: Negative for pain, discharge and visual disturbance.  Respiratory: Positive for shortness of breath. Negative for cough, chest tightness, wheezing and stridor.   Cardiovascular: Negative for chest pain, palpitations and leg swelling.  Gastrointestinal: Negative for nausea, vomiting, abdominal pain, diarrhea, constipation, blood in stool and abdominal distention.  Genitourinary: Negative for urgency, hematuria, flank pain, discharge, difficulty urinating and genital sores.  Musculoskeletal: Positive for myalgias, arthralgias and gait  problem. Negative for back pain and joint swelling.  Skin: Negative for rash.  Neurological: Positive for dizziness and weakness. Negative for syncope, speech difficulty, numbness and headaches.  Hematological: Negative for adenopathy. Does not bruise/bleed easily.  Psychiatric/Behavioral: Positive for behavioral problems, confusion and agitation. Negative for dysphoric mood. The patient is not nervous/anxious.        Objective:   Physical Exam  Constitutional: He is oriented to person, place, and time. He appears well-developed.       Elderly somewhat disheveled male alert in no distress. Blood pressure 98/60  HENT:  Head: Normocephalic.  Right Ear: External ear normal.  Left Ear: External ear normal.  Eyes: Conjunctivae and EOM are normal.  Neck: Normal range of motion.  Cardiovascular: Normal rate and normal heart sounds.   Pulmonary/Chest: Effort normal. No respiratory distress.       Breath sounds generally diminished  02 saturation at rest 98 with a pulse of 60  Abdominal: Soft. Bowel sounds are normal.  Musculoskeletal: Normal range of motion. He exhibits no edema and no tenderness.  Neurological: He is alert and oriented to person, place, and time.  Psychiatric: He has a normal mood and affect. His behavior is normal.          Assessment & Plan:   Permanent atrial fibrillation. Will continue aspirin and rate control medications. Clearly not a candidate for Coumadin anticoagulation Hypertension stable COPD Dementia. Will continue low-dose risperdal;  the patient has received occasional home health care. Dyslipidemia will continue pravastatin Status post osteomyelitis left great toe. The patient has had a recent ID followup last week and his PICC line removed  Recheck when necessary or 6  months

## 2011-04-22 NOTE — Patient Instructions (Signed)
Limit your sodium (Salt) intake  Return in 3 months for follow-up   

## 2011-04-22 NOTE — Telephone Encounter (Signed)
Patients daughter called advised that the patient has been confused and combative physically with she and her husband. He also has had frequent bathroom trips and she is afraid. She said that she had called his PCP and had not heard back yet. She called [place] because they say Dr Drue Second on Friday 04/19/11 and had his PICC removed and he had his last antibiotic on 04/18/11 and wondered if that has anything to do with this possibly. Advised her to take him to the ED at her discretion and that the PICC removal should not have anything to do with his behavior change.

## 2011-04-24 ENCOUNTER — Other Ambulatory Visit: Payer: Self-pay | Admitting: *Deleted

## 2011-04-24 ENCOUNTER — Telehealth: Payer: Self-pay | Admitting: Internal Medicine

## 2011-04-24 NOTE — Telephone Encounter (Signed)
Opened in Error. Correct note in chart.

## 2011-04-24 NOTE — Telephone Encounter (Signed)
Patient is out of his rx for respidone. Please assist.

## 2011-04-24 NOTE — Telephone Encounter (Signed)
Deb, this message is stil hanging around since Monday.  Couild you call and see exactly what she wanted?

## 2011-04-24 NOTE — Telephone Encounter (Signed)
[name] states she did not get a call back and has taken care of her family's problem.

## 2011-04-24 NOTE — Telephone Encounter (Signed)
[facility] did not know the pt or anything about him????

## 2011-04-25 ENCOUNTER — Telehealth: Payer: Self-pay | Admitting: *Deleted

## 2011-04-25 NOTE — Telephone Encounter (Signed)
[name] from [facility] called. IV meds are done & the PICC has been pulled (here in office) they are closing the case as they were only providing care for the iv meds

## 2011-04-25 NOTE — Telephone Encounter (Signed)
Rx faxed back to [facility].

## 2011-05-20 ENCOUNTER — Ambulatory Visit: Payer: Self-pay | Admitting: Cardiology

## 2011-05-24 ENCOUNTER — Emergency Department (HOSPITAL_COMMUNITY): Payer: Medicare Other

## 2011-05-24 ENCOUNTER — Encounter (HOSPITAL_COMMUNITY): Payer: Self-pay | Admitting: Emergency Medicine

## 2011-05-24 ENCOUNTER — Emergency Department (HOSPITAL_COMMUNITY)
Admission: EM | Admit: 2011-05-24 | Discharge: 2011-05-24 | Disposition: A | Discharge status: Home / Self Care | Payer: Medicare Other | Attending: Emergency Medicine | Admitting: Emergency Medicine

## 2011-05-24 DIAGNOSIS — M25559 Pain in unspecified hip: Secondary | ICD-10-CM | POA: Insufficient documentation

## 2011-05-24 DIAGNOSIS — I714 Abdominal aortic aneurysm, without rupture, unspecified: Secondary | ICD-10-CM | POA: Insufficient documentation

## 2011-05-24 DIAGNOSIS — M549 Dorsalgia, unspecified: Secondary | ICD-10-CM | POA: Insufficient documentation

## 2011-05-24 DIAGNOSIS — J449 Chronic obstructive pulmonary disease, unspecified: Secondary | ICD-10-CM | POA: Insufficient documentation

## 2011-05-24 DIAGNOSIS — M542 Cervicalgia: Secondary | ICD-10-CM | POA: Insufficient documentation

## 2011-05-24 DIAGNOSIS — E7800 Pure hypercholesterolemia, unspecified: Secondary | ICD-10-CM | POA: Insufficient documentation

## 2011-05-24 DIAGNOSIS — M1990 Unspecified osteoarthritis, unspecified site: Secondary | ICD-10-CM | POA: Insufficient documentation

## 2011-05-24 DIAGNOSIS — F068 Other specified mental disorders due to known physiological condition: Secondary | ICD-10-CM | POA: Insufficient documentation

## 2011-05-24 DIAGNOSIS — I4891 Unspecified atrial fibrillation: Secondary | ICD-10-CM | POA: Insufficient documentation

## 2011-05-24 DIAGNOSIS — I10 Essential (primary) hypertension: Secondary | ICD-10-CM | POA: Insufficient documentation

## 2011-05-24 DIAGNOSIS — J4489 Other specified chronic obstructive pulmonary disease: Secondary | ICD-10-CM | POA: Insufficient documentation

## 2011-05-24 DIAGNOSIS — M25552 Pain in left hip: Secondary | ICD-10-CM

## 2011-05-24 NOTE — ED Notes (Signed)
Pt able to ambulate to door and back with 2 assist.

## 2011-05-24 NOTE — ED Notes (Signed)
[id number]<AV> Expected date:05/24/11<BR> Expected time: 4:13 AM<BR> Means of arrival:Ambulance<BR> Comments:<BR> Almost fall, neck pain

## 2011-05-24 NOTE — ED Notes (Signed)
Pt found by family hanging out of hospital bed at home. Pt states he has neck and back pain. Pt told EMS he has pain to L hip. No deformity per EMS. Pt has hx of dementia. Pt arrives in spinal precautions. Pt a/o per normal baseline per family members. Family states patient has been more confused lately.

## 2011-05-24 NOTE — Discharge Instructions (Signed)
Hip Pain  The hips join the upper legs to the lower pelvis. The bones, cartilage, tendons, and muscles of the hip joint perform a lot of work each day holding your body weight and allowing you to move around.  Hip pain is a common symptom. It can range from a minor ache to severe pain on 1 or both hips. Pain may be felt on the inside of the hip joint near the groin, or the outside near the buttocks and upper thigh. There may be swelling or stiffness as well. It occurs more often when a person walks or performs activity. There are many reasons hip pain can develop.  CAUSES   It is important to work with your caregiver to identify the cause since many conditions can impact the bones, cartilage, muscles, and tendons of the hips. Causes for hip pain include:   Broken (fractured) bones.   Separation of the thighbone from the hip socket (dislocation).   Torn cartilage of the hip joint.   Swelling (inflammation) of a tendon (tendonitis), the sac within the hip joint (bursitis), or a joint.   A weakening in the abdominal wall (hernia), affecting the nerves to the hip.   Arthritis in the hip joint or lining of the hip joint.   Pinched nerves in the back, hip, or upper thigh.   A bulging disc in the spine (herniated disc).   Rarely, bone infection or cancer.  DIAGNOSIS   The location of your hip pain will help your caregiver understand what may be causing the pain. A diagnosis is based on your medical history, your symptoms, results from your physical exam, and results from diagnostic tests. Diagnostic tests may include X-ray exams, a computerized magnetic scan (magnetic resonance imaging, MRI), or bone scan.  TREATMENT   Treatment will depend on the cause of your hip pain. Treatment may include:   Limiting activities and resting until symptoms improve.   Crutches or other walking supports (a cane or brace).   Ice, elevation, and compression.   Physical therapy or home exercises.   Shoe inserts or special  shoes.   Losing weight.   Medications to reduce pain.   Undergoing surgery.  HOME CARE INSTRUCTIONS    Only take over-the-counter or prescription medicines for pain, discomfort, or fever as directed by your caregiver.   Put ice on the injured area:   Put ice in a plastic bag.   Place a towel between your skin and the bag.   Leave the ice on for 15 to 20 minutes at a time, 3 to 4 times a day.   Keep your leg raised (elevated) when possible to lessen swelling.   Avoid activities that cause pain.   Follow specific exercises as directed by your caregiver.   Sleep with a pillow between your legs on your most comfortable side.   Record how often you have hip pain, the location of the pain, and what it feels like. This information may be helpful to you and your caregiver.   Ask your caregiver about returning to work or sports and whether you should drive.   Follow up with your caregiver for further exams, therapy, or testing as directed.  SEEK MEDICAL CARE IF:    Your pain or swelling continues or worsens after 1 week.   You are feeling unwell or have chills.   You have increasing difficulty with walking.   You have a loss of sensation or other new symptoms.   You have questions   or concerns.  SEEK IMMEDIATE MEDICAL CARE IF:    You cannot put weight on the affected hip.   You have fallen.   You have a sudden increase in pain and swelling in your hip.   You have a fever.  MAKE SURE YOU:    Understand these instructions.   Will watch your condition.   Will get help right away if you are not doing well or get worse.  Document Released: 08/22/2009 Document Revised: 02/21/2011 Document Reviewed: 08/22/2009  [employer] Patient Information 2012 [employer].

## 2011-05-24 NOTE — ED Notes (Signed)
Pt's daughter states his legs were caught up in his walker as he was trying to get out of bed. Pt denies neck or back pain at present, but complains about his L hip hurting.

## 2011-05-24 NOTE — ED Provider Notes (Signed)
History     CSN: [id number]  Arrival date & time 05/24/11  [id number]   First MD Initiated Contact with Patient 05/24/11 [phone]      Chief Complaint  Patient presents with  . Neck Pain  . Back Pain   level V caveat due to dementia  (Consider location/radiation/quality/duration/timing/severity/associated sxs/prior treatment) Patient is a 78 y.o. male presenting with neck pain and back pain. The history is provided by the EMS personnel, a relative and the patient.  Neck Pain   Back Pain    patient was found by daughter with his legs hanging off the side of his hospital bed at home. Patient has had previous left leg surgery and pelvis fractures. He has not fallen out of bed. Patient states he has pain in his left hip. He also has complained about some neck pain. He has not fallen out of bed. He is at his baseline dementia. He has had some episodes recently where his dementia has been more severe.  Past Medical History  Diagnosis Date  . Atrial fibrillation   . AAA (abdominal aortic aneurysm)   . Carotid stenosis   . Hypercholesteremia   . Cardiomyopathy     improved  . Renal artery stenosis   . COPD (chronic obstructive pulmonary disease)   . Osteoarthritis   . Prostate cancer   . Atrial flutter     s/p ablation  . Hip fracture   . DEMENTIA   . Hypertension   . Macular degeneration disease     Past Surgical History  Procedure Date  . Appendectomy   . Cholecystectomy   . Tonsillectomy     Family History  Problem Relation Age of Onset  . Heart attack Father     History  Substance Use Topics  . Smoking status: Former Smoker -- 1.0 packs/day for 15 years    Types: Cigarettes    Quit date: 01/21/2001  . Smokeless tobacco: Former Neurosurgeon  . Alcohol Use: No      Review of Systems  HENT: Positive for neck pain.   Musculoskeletal: Positive for back pain.    Allergies  Review of patient's allergies indicates no known allergies.  Home Medications   Current Outpatient  Rx  Name Route Sig Dispense Refill  . ASPIRIN EC 81 MG PO TBEC Oral Take 81 mg by mouth daily.      Marland Kitchen DICLOFENAC SODIUM 3 % TD GEL Transdermal Place 1 application onto the skin 2 (two) times daily. 12 Tube 6  . LEVALBUTEROL HCL 0.63 MG/3ML IN NEBU Nebulization Take 3 mLs (0.63 mg total) by nebulization every 4 (four) hours as needed for wheezing. 120 mL 2  . METOPROLOL TARTRATE 50 MG PO TABS Oral Take 50 mg by mouth 2 (two) times daily.    Marland Kitchen POTASSIUM CHLORIDE CRYS ER 20 MEQ PO TBCR Oral Take 1 tablet (20 mEq total) by mouth 2 (two) times daily. 180 tablet 3  . PRAVASTATIN SODIUM 40 MG PO TABS Oral Take 40 mg by mouth at bedtime.     Marland Kitchen RISPERIDONE 0.25 MG PO TABS Oral Take 0.25 mg by mouth at bedtime.      BP 161/87  Pulse 75  Temp(Src) 97.6 F (36.4 C) (Oral)  Resp 20  SpO2 98%  Physical Exam  Nursing note and vitals reviewed. Constitutional: He appears well-developed and well-nourished.  HENT:  Head: Normocephalic and atraumatic.  Eyes: EOM are normal. Pupils are equal, round, and reactive to light.  Neck: Normal range of  motion. Neck supple.  Cardiovascular: Normal rate, regular rhythm and normal heart sounds.   No murmur heard. Pulmonary/Chest: Effort normal and breath sounds normal.  Abdominal: Soft. Bowel sounds are normal. He exhibits no distension and no mass. There is no tenderness. There is no rebound and no guarding.  Musculoskeletal: He exhibits no edema.       Cervical thoracic and lumbar spine nontender. Tenderness to left pelvis. Mild pain over proximal left hip also. No shortening or external rotation of lower extremity. Sensation appears intact distally.  Neurological: He is alert. No cranial nerve deficit.       Patient is at his baseline dementia per family  Skin: Skin is warm and dry.  Psychiatric: He has a normal mood and affect.    ED Course  Procedures (including critical care time)  Labs Reviewed - No data to display Dg Hip Complete Left  05/24/2011   *RADIOLOGY REPORT*  Clinical Data: Left hip pain, fell from bed twisting left hip  LEFT HIP - COMPLETE 2+ VIEW  Comparison: Left femoral radiographs 09/27/2010  Findings: Brachytherapy seed implants at prostate bed. Marked osseous demineralization. Mild bilateral narrowing of hip joints. Scattered atherosclerotic calcifications. Long left femoral nail with compression screw at proximal left femur post ORIF of an intertrochanteric fracture. No femoral fracture or dislocation identified. No definite acute pelvic fractures seen. Probable old fracture right inferior pubic ramus.  IMPRESSION: Osseous demineralization. ORIF proximal left femoral fracture. No definite acute bony abnormalities.  Original Report Authenticated By: Lollie Marrow, M.D.     1. Left hip pain       MDM  Patient left hip pain after legs and bed. X-rays are negative. He was able to ambulate here. Doubt severe injury. He did not fall out of bed. He'll be discharged back home.        Juliet Rude. Rubin Payor, MD 05/24/11 [phone]

## 2011-05-24 NOTE — ED Notes (Signed)
Patient transported to X-ray 

## 2011-05-24 NOTE — ED Notes (Signed)
Pt and daughter given discharge instructions and verb understanding, assisted to lobby in adult stroller.

## 2011-05-27 ENCOUNTER — Other Ambulatory Visit: Payer: Self-pay | Admitting: Cardiology

## 2011-05-27 DIAGNOSIS — I714 Abdominal aortic aneurysm, without rupture: Secondary | ICD-10-CM

## 2011-05-29 ENCOUNTER — Encounter (INDEPENDENT_AMBULATORY_CARE_PROVIDER_SITE_OTHER): Payer: Medicare Other

## 2011-05-29 DIAGNOSIS — I714 Abdominal aortic aneurysm, without rupture: Secondary | ICD-10-CM

## 2011-06-24 ENCOUNTER — Telehealth: Payer: Self-pay | Admitting: Internal Medicine

## 2011-06-24 NOTE — Telephone Encounter (Signed)
Pt daughter called and stated that Home Health will not come and pick up the oxygen tanks because they needs a order. Pt requesting to have an order faxed to [phone] so that they can have tanks pick up, pt is no longer using oxygen.

## 2011-06-24 NOTE — Telephone Encounter (Signed)
Please advise ok to dc O2

## 2011-06-24 NOTE — Telephone Encounter (Signed)
faxed

## 2011-06-24 NOTE — Telephone Encounter (Signed)
Ok to dc O2 

## 2011-07-08 ENCOUNTER — Other Ambulatory Visit: Payer: Self-pay | Admitting: Cardiovascular Disease

## 2011-07-12 ENCOUNTER — Encounter: Payer: Self-pay | Admitting: Family

## 2011-07-12 ENCOUNTER — Ambulatory Visit (INDEPENDENT_AMBULATORY_CARE_PROVIDER_SITE_OTHER): Admitting: Family

## 2011-07-12 VITALS — BP 110/68 | HR 87 | Temp 100.3°F

## 2011-07-12 DIAGNOSIS — R509 Fever, unspecified: Secondary | ICD-10-CM

## 2011-07-12 DIAGNOSIS — J189 Pneumonia, unspecified organism: Secondary | ICD-10-CM

## 2011-07-12 DIAGNOSIS — R350 Frequency of micturition: Secondary | ICD-10-CM

## 2011-07-12 MED ORDER — MOXIFLOXACIN HCL 400 MG PO TABS: 400.0000 mg | ORAL_TABLET | Freq: Every day | ORAL | Status: AC

## 2011-07-12 MED ORDER — CEFTRIAXONE SODIUM 1 G IJ SOLR
1.0000 g | INTRAMUSCULAR | Status: DC
  Administered 2011-07-12: 1 g via INTRAMUSCULAR

## 2011-07-12 NOTE — Progress Notes (Signed)
Subjective:    Patient ID: [name], male    DOB: 12/19/1923, 78 y.o.   MRN: [id number]  HPI 78 year old white male, nonsmoker, is in with his son with complaints of weakness, fever, decreased appetite, strong urinary odor x1day. Patient has a history of urinary tract infections that typically present the same way. Temperature has been around 100. Patient denies any lightheadedness, dizziness, chest pain, palpitations or edema.    Review of Systems  Constitutional: Negative.   HENT: Negative.   Respiratory: Negative.   Cardiovascular: Negative.   Gastrointestinal: Negative.   Genitourinary: Positive for urgency and frequency. Negative for dysuria.  Musculoskeletal: Negative.   Skin: Negative.   Neurological:       Dementia  Hematological: Negative.   Psychiatric/Behavioral: Negative.    Past Medical History  Diagnosis Date  . Atrial fibrillation   . AAA (abdominal aortic aneurysm)   . Carotid stenosis   . Hypercholesteremia   . Cardiomyopathy     improved  . Renal artery stenosis   . COPD (chronic obstructive pulmonary disease)   . Osteoarthritis   . Prostate cancer   . Atrial flutter     s/p ablation  . Hip fracture   . DEMENTIA   . Hypertension   . Macular degeneration disease     History   Social History  . Marital Status: Widowed    Spouse Name: N/A    Number of Children: N/A  . Years of Education: N/A   Occupational History  . Not on file.   Social History Main Topics  . Smoking status: Former Smoker -- 1.0 packs/day for 15 years    Types: Cigarettes    Quit date: 01/21/2001  . Smokeless tobacco: Former Neurosurgeon  . Alcohol Use: No  . Drug Use: No  . Sexually Active:    Other Topics Concern  . Not on file   Social History Narrative   ** Merged History Encounter **     Past Surgical History  Procedure Date  . Appendectomy   . Cholecystectomy   . Tonsillectomy     Family History  Problem Relation Age of Onset  . Heart attack Father      No Known Allergies  Current Outpatient Prescriptions on File Prior to Visit  Medication Sig Dispense Refill  . aspirin EC 81 MG tablet Take 81 mg by mouth daily.        . Diclofenac Sodium 3 % GEL Place 1 application onto the skin 2 (two) times daily.  12 Tube  6  . diltiazem (CARDIZEM CD) 180 MG 24 hr capsule TAKE ONE CAPSULE BY MOUTH EVERY DAY  30 capsule  12  . levalbuterol (XOPENEX) 0.63 MG/3ML nebulizer solution Take 3 mLs (0.63 mg total) by nebulization every 4 (four) hours as needed for wheezing.  120 mL  2  . metoprolol (LOPRESSOR) 50 MG tablet Take 50 mg by mouth 2 (two) times daily.      . potassium chloride SA (K-DUR,KLOR-CON) 20 MEQ tablet Take 1 tablet (20 mEq total) by mouth 2 (two) times daily.  180 tablet  3  . pravastatin (PRAVACHOL) 40 MG tablet Take 40 mg by mouth at bedtime.       . risperiDONE (RISPERDAL) 0.25 MG tablet Take 0.25 mg by mouth at bedtime.      . risperiDONE (RISPERDAL) 0.25 MG tablet Take 1 tablet (0.25 mg total) by mouth 2 (two) times daily.  90 tablet  4   No current facility-administered medications  on file prior to visit.    BP 110/68  Pulse 87  Temp(Src) 100.3 F (37.9 C) (Oral)  SpO2 98%chart    Objective:   Physical Exam  Constitutional: He is oriented to person, place, and time. He appears well-developed and well-nourished.  HENT:  Right Ear: External ear normal.  Left Ear: External ear normal.  Nose: Nose normal.  Mouth/Throat: Oropharynx is clear and moist.  Neck: Normal range of motion. Neck supple.  Cardiovascular: Normal rate, regular rhythm and normal heart sounds.   Pulmonary/Chest: He has wheezes.       Present rhonchi at the left lung.  Abdominal: Soft. Bowel sounds are normal. There is no tenderness. There is no rebound and no guarding.  Musculoskeletal: He exhibits no edema and no tenderness.       In a wheelchair  Neurological: He is alert and oriented to person, place, and time.  Skin: Skin is warm and dry.   Psychiatric: He has a normal mood and affect.      patient unable to obtain urine in the office      Assessment & Plan:  assessment: Dementia, left lung pneumonia, possible UTI  Plan: Rocephin 1 g IM x1 given. Avelox 400 mg one tablet a day x10 days given. Call the office if symptoms worsen or persist. Recheck in 4 days.

## 2011-07-12 NOTE — Patient Instructions (Addendum)
Pneumonia, Adult Pneumonia is an infection of the lungs.  CAUSES Pneumonia may be caused by bacteria or a virus. Usually, these infections are caused by breathing infectious particles into the lungs (respiratory tract). SYMPTOMS   Cough.   Fever.   Chest pain.   Increased rate of breathing.   Wheezing.   Mucus production.  DIAGNOSIS  If you have the common symptoms of pneumonia, your caregiver will typically confirm the diagnosis with a chest X-ray. The X-ray will show an abnormality in the lung (pulmonary infiltrate) if you have pneumonia. Other tests of your blood, urine, or sputum may be done to find the specific cause of your pneumonia. Your caregiver may also do tests (blood gases or pulse oximetry) to see how well your lungs are working. TREATMENT  Some forms of pneumonia may be spread to other people when you cough or sneeze. You may be asked to wear a mask before and during your exam. Pneumonia that is caused by bacteria is treated with antibiotic medicine. Pneumonia that is caused by the influenza virus may be treated with an antiviral medicine. Most other viral infections must run their course. These infections will not respond to antibiotics.  PREVENTION A pneumococcal shot (vaccine) is available to prevent a common bacterial cause of pneumonia. This is usually suggested for:  People over 65 years old.   Patients on chemotherapy.   People with chronic lung problems, such as bronchitis or emphysema.   People with immune system problems.  If you are over 65 or have a high risk condition, you may receive the pneumococcal vaccine if you have not received it before. In some countries, a routine influenza vaccine is also recommended. This vaccine can help prevent some cases of pneumonia.You may be offered the influenza vaccine as part of your care. If you smoke, it is time to quit. You may receive instructions on how to stop smoking. Your caregiver can provide medicines and  counseling to help you quit. HOME CARE INSTRUCTIONS   Cough suppressants may be used if you are losing too much rest. However, coughing protects you by clearing your lungs. You should avoid using cough suppressants if you can.   Your caregiver may have prescribed medicine if he or she thinks your pneumonia is caused by a bacteria or influenza. Finish your medicine even if you start to feel better.   Your caregiver may also prescribe an expectorant. This loosens the mucus to be coughed up.   Only take over-the-counter or prescription medicines for pain, discomfort, or fever as directed by your caregiver.   Do not smoke. Smoking is a common cause of bronchitis and can contribute to pneumonia. If you are a smoker and continue to smoke, your cough may last several weeks after your pneumonia has cleared.   A cold steam vaporizer or humidifier in your room or home may help loosen mucus.   Coughing is often worse at night. Sleeping in a semi-upright position in a recliner or using a couple pillows under your head will help with this.   Get rest as you feel it is needed. Your body will usually let you know when you need to rest.  SEEK IMMEDIATE MEDICAL CARE IF:   Your illness becomes worse. This is especially true if you are elderly or weakened from any other disease.   You cannot control your cough with suppressants and are losing sleep.   You begin coughing up blood.   You develop pain which is getting worse or   is uncontrolled with medicines.   You have a fever.   Any of the symptoms which initially brought you in for treatment are getting worse rather than better.   You develop shortness of breath or chest pain.  MAKE SURE YOU:   Understand these instructions.   Will watch your condition.   Will get help right away if you are not doing well or get worse.  Document Released: 03/04/2005 Document Revised: 02/21/2011 Document Reviewed: 05/24/2010 ExitCare Patient Information 2012  [employer].Urinary Tract Infection Infections of the urinary tract can start in several places. A bladder infection (cystitis), a kidney infection (pyelonephritis), and a prostate infection (prostatitis) are different types of urinary tract infections (UTIs). They usually get better if treated with medicines (antibiotics) that kill germs. Take all the medicine until it is gone. You or your child may feel better in a few days, but TAKE ALL MEDICINE or the infection may not respond and may become more difficult to treat. HOME CARE INSTRUCTIONS   Drink enough water and fluids to keep the urine clear or pale yellow. Cranberry juice is especially recommended, in addition to large amounts of water.   Avoid caffeine, tea, and carbonated beverages. They tend to irritate the bladder.   Alcohol may irritate the prostate.   Only take over-the-counter or prescription medicines for pain, discomfort, or fever as directed by your caregiver.  To prevent further infections:  Empty the bladder often. Avoid holding urine for long periods of time.   After a bowel movement, women should cleanse from front to back. Use each tissue only once.   Empty the bladder before and after sexual intercourse.  FINDING OUT THE RESULTS OF YOUR TEST Not all test results are available during your visit. If your or your child's test results are not back during the visit, make an appointment with your caregiver to find out the results. Do not assume everything is normal if you have not heard from your caregiver or the medical facility. It is important for you to follow up on all test results. SEEK MEDICAL CARE IF:   There is back pain.   Your baby is older than 3 months with a rectal temperature of 100.5 F (38.1 C) or higher for more than 1 day.   Your or your child's problems (symptoms) are no better in 3 days. Return sooner if you or your child is getting worse.  SEEK IMMEDIATE MEDICAL CARE IF:   There is severe back  pain or lower abdominal pain.   You or your child develops chills.   You have a fever.   Your baby is older than 3 months with a rectal temperature of 102 F (38.9 C) or higher.   Your baby is 3 months old or younger with a rectal temperature of 100.4 F (38 C) or higher.   There is nausea or vomiting.   There is continued burning or discomfort with urination.  MAKE SURE YOU:   Understand these instructions.   Will watch your condition.   Will get help right away if you are not doing well or get worse.  Document Released: 12/12/2004 Document Revised: 02/21/2011 Document Reviewed: 07/17/2006 ExitCare Patient Information 2012 [employer]. 

## 2011-07-17 ENCOUNTER — Ambulatory Visit (INDEPENDENT_AMBULATORY_CARE_PROVIDER_SITE_OTHER): Payer: Medicare Other | Admitting: Internal Medicine

## 2011-07-17 ENCOUNTER — Encounter: Payer: Self-pay | Admitting: Internal Medicine

## 2011-07-17 VITALS — BP 100/60 | Temp 97.9°F | Wt 130.0 lb

## 2011-07-17 DIAGNOSIS — I4891 Unspecified atrial fibrillation: Secondary | ICD-10-CM

## 2011-07-17 DIAGNOSIS — I10 Essential (primary) hypertension: Secondary | ICD-10-CM

## 2011-07-17 DIAGNOSIS — F0390 Unspecified dementia without behavioral disturbance: Secondary | ICD-10-CM

## 2011-07-17 DIAGNOSIS — J441 Chronic obstructive pulmonary disease with (acute) exacerbation: Secondary | ICD-10-CM

## 2011-07-17 NOTE — Progress Notes (Signed)
  Subjective:    Patient ID: [name], male    DOB: 07-17-1923, 78 y.o.   MRN: [id number]  HPI  78 year old patient who is seen today in followup. He was seen last week with suspected pneumonia and is completing antibiotic therapy with Avelox. Today he is much improved and denies any shortness of breath cough or chest congestion. There has been no fever. He does have a history of COPD. Medical problems include chronic dementia hypertension and history of chronic atrial fibrillation    Review of Systems  Constitutional: Positive for activity change and appetite change.  Neurological: Positive for weakness.       Objective:   Physical Exam  Constitutional: He is oriented to person, place, and time. He appears well-developed.       Alert demented chronically ill but in no acute distress. Afebrile. Blood pressure low normal range  HENT:  Head: Normocephalic.  Right Ear: External ear normal.  Left Ear: External ear normal.  Eyes: Conjunctivae and EOM are normal.  Neck: Normal range of motion.  Cardiovascular: Normal rate and normal heart sounds.   Pulmonary/Chest: Effort normal.       Diminished breath sounds rare scattered rhonchi Thoracotomy scar left lower lung field posteriorly  O2 saturation 97  Abdominal: Bowel sounds are normal.  Musculoskeletal: Normal range of motion. He exhibits no edema and no tenderness.  Neurological: He is alert and oriented to person, place, and time.  Psychiatric: He has a normal mood and affect. His behavior is normal.          Assessment & Plan:   COPD. Recent exacerbation. Nice  clinical response to antibiotic therapy will complete Chronic atrial fibrillation stable Hypertension well controlled Dementia

## 2011-07-17 NOTE — Patient Instructions (Signed)
.  Call or return to clinic prn if these symptoms worsen or fail to improve as anticipated.  Return in 6 months for follow-up  

## 2011-07-19 ENCOUNTER — Telehealth: Payer: Self-pay | Admitting: Family Medicine

## 2011-07-19 NOTE — Telephone Encounter (Signed)
[facility] Triage Call Report Triage Record Num: [id number] Operator: Alphonsa Overall Patient Name: Hazel Wrinkle Call Date & Time: 07/18/2011 6:32:39PM Patient Phone: [phone] PCP: Gordy Savers Patient Gender: Male PCP Fax : [phone] Patient DOB: 1923-07-29 [facility] Name[facility] Reason for Call: Caller: Jena/Other; PCP: Eleonore Chiquito; CB#: [phone]; Call regarding Confusion Jena/daughter calling about confusion. Onset 07/17/11. Pt being on abx for UTI since 07/13/11. Afebrile. Confusion and agitation. 114/79 BP at1830. Pt seeing and hearing things, polar bears in his bed. Jena aware pt needs ED evaluation now. Protocol(s) Used: Confusion, Disorientation, Agitation Recommended Outcome per Protocol: See ED Immediately Reason for Outcome: Hearing voices that no one else hears or seeing things that no one else sees Care Advice: ~ Another adult should drive. ~ Remove dangerous objects from patient's access. ~ Do not leave patient alone. ~ Avoid confronting or agitating patient. ~ Minimize stimulation in environment. ~ Protect the patient from harm or injury, if possible, while not putting others or yourself at risk. ~ IMMEDIATE ACTION Write down provider's name. List or place the following in a bag for transport with the patient: current prescription and/or nonprescription medications; alternative treatments, therapies and medications; and street drugs. ~ 05/

## 2011-09-24 ENCOUNTER — Telehealth: Payer: Self-pay | Admitting: Internal Medicine

## 2011-09-24 NOTE — Telephone Encounter (Signed)
Spoke with [name] - they have not chosen a facility - once they do - check with them she can drop off or the facility may do it - but it will take a few days to complete. KIK

## 2011-09-24 NOTE — Telephone Encounter (Signed)
Caller: Jena/Child; Phone Number: [phone]; Message from caller: Daughter calling today 09/24/11 regarding considering placing Dad in facility.  Needs FL 2 form filled out by Dr.  Amador Cunas.  PLEASE CALL DAUGHTER BACK AT [phone] (CELL) TO LET HER KNOW IF SHE CAN COME BY AND PICK THIS UP OR DOES SHE HAVE TO GET THIS FORM FROM FACILITY TO BRING TO OFFICE.  PLEASE CALL PT BACK TO ADVISE ON HOW TO PROCEED.

## 2011-10-07 DIAGNOSIS — Z0279 Encounter for issue of other medical certificate: Secondary | ICD-10-CM

## 2011-10-30 ENCOUNTER — Other Ambulatory Visit: Payer: Self-pay | Admitting: Internal Medicine

## 2011-11-12 ENCOUNTER — Ambulatory Visit (INDEPENDENT_AMBULATORY_CARE_PROVIDER_SITE_OTHER): Payer: Medicare Other | Admitting: Internal Medicine

## 2011-11-12 ENCOUNTER — Encounter: Payer: Self-pay | Admitting: Internal Medicine

## 2011-11-12 VITALS — BP 130/80 | Temp 97.5°F | Wt 140.0 lb

## 2011-11-12 DIAGNOSIS — I10 Essential (primary) hypertension: Secondary | ICD-10-CM

## 2011-11-12 DIAGNOSIS — R350 Frequency of micturition: Secondary | ICD-10-CM

## 2011-11-12 DIAGNOSIS — F0390 Unspecified dementia without behavioral disturbance: Secondary | ICD-10-CM

## 2011-11-12 DIAGNOSIS — G3184 Mild cognitive impairment, so stated: Secondary | ICD-10-CM

## 2011-11-12 DIAGNOSIS — I4891 Unspecified atrial fibrillation: Secondary | ICD-10-CM

## 2011-11-12 LAB — POCT URINALYSIS DIPSTICK
Bilirubin, UA: NEGATIVE
Ketones, UA: NEGATIVE
Leukocytes, UA: NEGATIVE
Spec Grav, UA: 1.01
pH, UA: 6.5

## 2011-11-12 NOTE — Patient Instructions (Signed)
Limit your sodium (Salt) intake    It is important that you exercise regularly, at least 20 minutes 3 to 4 times per week.  If you develop chest pain or shortness of breath seek  medical attention.  Return in 4 months for follow-up   

## 2011-11-12 NOTE — Progress Notes (Signed)
Subjective:    Patient ID: [name], male    DOB: [birth year], 78 y.o.   MRN: [id number]  HPI  78 year old patient who is seen today for followup. Is a copy by daughter who is concerned about a bladder infection. He has had UTIs in the past. He has a history of dementia which has progressed. He has become more forgetful and has some occasional hallucinations. He is treated hypertension and permanent atrial fibrillation. He is on aspirin only due  to a significant fall risk.  Past Medical History  Diagnosis Date  . Atrial fibrillation   . AAA (abdominal aortic aneurysm)   . Carotid stenosis   . Hypercholesteremia   . Cardiomyopathy     improved  . Renal artery stenosis   . COPD (chronic obstructive pulmonary disease)   . Osteoarthritis   . Prostate cancer   . Atrial flutter     s/p ablation  . Hip fracture   . DEMENTIA   . Hypertension   . Macular degeneration disease     History   Social History  . Marital Status: Widowed    Spouse Name: N/A    Number of Children: N/A  . Years of Education: N/A   Occupational History  . Not on file.   Social History Main Topics  . Smoking status: Former Smoker -- 1.0 packs/day for 15 years    Types: Cigarettes    Quit date: 01/21/2001  . Smokeless tobacco: Former Neurosurgeon  . Alcohol Use: No  . Drug Use: No  . Sexually Active:    Other Topics Concern  . Not on file   Social History Narrative   ** Merged History Encounter **     Past Surgical History  Procedure Date  . Appendectomy   . Cholecystectomy   . Tonsillectomy     Family History  Problem Relation Age of Onset  . Heart attack Father     No Known Allergies  Current Outpatient Prescriptions on File Prior to Visit  Medication Sig Dispense Refill  . aspirin EC 81 MG tablet Take 81 mg by mouth daily.        . Diclofenac Sodium 3 % GEL Place 1 application onto the skin 2 (two) times daily.  12 Tube  6  . diltiazem (CARDIZEM CD) 180 MG 24 hr capsule TAKE ONE  CAPSULE BY MOUTH EVERY DAY  30 capsule  12  . metoprolol (LOPRESSOR) 50 MG tablet Take 50 mg by mouth 2 (two) times daily.      . potassium chloride SA (K-DUR,KLOR-CON) 20 MEQ tablet Take 1 tablet (20 mEq total) by mouth 2 (two) times daily.  180 tablet  3  . pravastatin (PRAVACHOL) 40 MG tablet Take 40 mg by mouth at bedtime.       . risperiDONE (RISPERDAL) 0.25 MG tablet TAKE ONE TABLET BY MOUTH AT BEDTIME  30 tablet  5  . DISCONTD: risperiDONE (RISPERDAL) 0.25 MG tablet Take 1 tablet (0.25 mg total) by mouth 2 (two) times daily.  90 tablet  4  . DISCONTD: risperiDONE (RISPERDAL) 0.25 MG tablet Take 0.25 mg by mouth at bedtime.       BP 130/80  Temp 97.5 F (36.4 C) (Oral)  Wt 140 lb (63.504 kg)    Review of Systems  Constitutional: Negative for fever, chills, appetite change and fatigue.  HENT: Negative for hearing loss, ear pain, congestion, sore throat, trouble swallowing, neck stiffness, dental problem, voice change and tinnitus.   Eyes: Negative  for pain, discharge and visual disturbance.  Respiratory: Negative for cough, chest tightness, wheezing and stridor.   Cardiovascular: Negative for chest pain, palpitations and leg swelling.  Gastrointestinal: Negative for nausea, vomiting, abdominal pain, diarrhea, constipation, blood in stool and abdominal distention.  Genitourinary: Negative for urgency, hematuria, flank pain, discharge, difficulty urinating and genital sores.  Musculoskeletal: Negative for myalgias, back pain, joint swelling, arthralgias and gait problem.  Skin: Negative for rash.  Neurological: Negative for dizziness, syncope, speech difficulty, weakness, numbness and headaches.  Hematological: Negative for adenopathy. Does not bruise/bleed easily.  Psychiatric/Behavioral: Negative for behavioral problems and dysphoric mood. The patient is not nervous/anxious.        Objective:   Physical Exam  Constitutional: He is oriented to person, place, and time. He appears  well-developed.  HENT:  Head: Normocephalic.  Right Ear: External ear normal.  Left Ear: External ear normal.  Eyes: Conjunctivae and EOM are normal.  Neck: Normal range of motion.  Cardiovascular: Normal rate.   Murmur heard.      Controlled ventricular response  2/6 systolic murmur  Pulmonary/Chest: Breath sounds normal.  Abdominal: Bowel sounds are normal.  Musculoskeletal: Normal range of motion. He exhibits no edema and no tenderness.  Neurological: He is alert and oriented to person, place, and time.  Psychiatric: He has a normal mood and affect. His behavior is normal.          Assessment & Plan:   Dementia  HTN-stable CAF       Urinalysis reviewed-  no evidence of a UTI Return office visit 4 months

## 2011-11-17 ENCOUNTER — Other Ambulatory Visit: Payer: Self-pay | Admitting: Internal Medicine

## 2011-11-19 ENCOUNTER — Telehealth: Payer: Self-pay | Admitting: Family Medicine

## 2011-11-19 DIAGNOSIS — R441 Visual hallucinations: Secondary | ICD-10-CM

## 2011-11-19 DIAGNOSIS — F0390 Unspecified dementia without behavioral disturbance: Secondary | ICD-10-CM

## 2011-11-19 MED ORDER — DONEPEZIL HCL 10 MG PO TBDP: 10.0000 mg | ORAL_TABLET | Freq: Every morning | ORAL | Status: DC

## 2011-11-19 NOTE — Telephone Encounter (Signed)
Also, please call in a new prescription for generic Aricept 10 mg every morning

## 2011-11-19 NOTE — Telephone Encounter (Signed)
Spoke with Eileen Stanford - informed of dr. Vernon Prey instructions - rx called in , order for referral done - they are seeking nursing home placement because he has become difficult to handle in the home setting. KIK

## 2011-11-19 NOTE — Telephone Encounter (Signed)
Patient has significant dementia with hallucination. We did check a UTI at the time of this office visit here on August 27. Please check with the family how we can assist.    Do They request neurological evaluation?

## 2011-11-19 NOTE — Telephone Encounter (Signed)
If you cannot reach her on her mobile, please call the house: [phone].

## 2011-11-19 NOTE — Telephone Encounter (Signed)
Just seen 11/12/11 - please advise

## 2011-11-19 NOTE — Telephone Encounter (Signed)
Daughter calling. Has concerns - he was here recently to make sure he had no UTI, due to strange behavior. Behavior is not better. Does not know where he is at, is hallucinating, does not know who [place] is. She feels he is worse, and she can't sleep, because she can't trust him not to wander the house at all hours. She wonders if this is part of the dementia or something else. Urinating a LOT. He This has been going on since 8/25. Please call to advise, or let me know if he needs appt or referral

## 2011-12-31 ENCOUNTER — Other Ambulatory Visit: Payer: Self-pay | Admitting: Cardiology

## 2011-12-31 DIAGNOSIS — I714 Abdominal aortic aneurysm, without rupture: Secondary | ICD-10-CM

## 2012-01-22 ENCOUNTER — Telehealth: Payer: Self-pay | Admitting: *Deleted

## 2012-01-22 NOTE — Telephone Encounter (Signed)
Received PC from Theodosia Paling NP.  She made a call to pt home today and daughter Eileen Stanford feels it's time to place pt is assisted living/nursing home care.  Pt has had several falls, experiencing weight loss, hygiene is poor and she just cannot keep up with it any longer.  Pt will need FL-2 filled out to place.  Pt last OV was in August.  Please call Jenna with your response.  I do not see Jenna as DPR, she is his Emergency Contact.

## 2012-01-23 NOTE — Telephone Encounter (Signed)
Ok to fill out FL2

## 2012-01-23 NOTE — Telephone Encounter (Signed)
Spoke with [name] , told her dr. Amador Cunas would fill out FL2 once they decide where he will be going

## 2012-03-02 ENCOUNTER — Telehealth: Payer: Self-pay | Admitting: Internal Medicine

## 2012-03-02 NOTE — Telephone Encounter (Signed)
Patient Information:  Caller Name: [name]  Phone: [phone]  Patient: [name], [name]  Gender: Male  DOB: Sep 08, 1923  Age: 78 Years  PCP: Eleonore Chiquito [facility])  Office Follow Up:  Does the office need to follow up with this patient?: No  Instructions For The Office: N/A   Symptoms  Reason For Call & Symptoms: Daughter believes that her father's  dementia is turning into Alzheimers.  She needs help with respite care and she has paperwork for this.   She is calling for guidance for support systems.  She is getting very tired.  Reviewed Health History In EMR: Yes  Reviewed Medications In EMR: Yes  Reviewed Allergies In EMR: Yes  Reviewed Surgeries / Procedures: Yes  Date of Onset of Symptoms: 02/25/2012  Guideline(s) Used:  Confusion - Delirium  Disposition Per Guideline:   See Today or Tomorrow in Office  Reason For Disposition Reached:   Longstanding confusion (e.g., dementia, stroke) and worsening  Advice Given:  Sundowning  Definition: Sundowning or "Sundown Syndrome" refers to the increased confusion that is sometimes seen in the late afternoon and evening in individuals with Alzheimer's Dementia and certain other brain conditions.  Sundowning  Definition: Sundowning or "Sundown Syndrome" refers to the increased confusion that is sometimes seen in the late afternoon and evening in individuals with Alzheimer's Dementia and certain other brain conditions.  Symptoms: In addition to increased confusion there may be agitation, paranoia, hallucinations, and wandering (i.e., leaving house and getting lost).  Appointment Scheduled:  03/03/2012 09:15:00 Appointment Scheduled Provider:  Eleonore Chiquito [facility])

## 2012-03-03 ENCOUNTER — Encounter: Payer: Self-pay | Admitting: Internal Medicine

## 2012-03-03 ENCOUNTER — Ambulatory Visit (INDEPENDENT_AMBULATORY_CARE_PROVIDER_SITE_OTHER): Payer: Medicare Other | Admitting: Internal Medicine

## 2012-03-03 VITALS — BP 130/80 | HR 93 | Temp 97.6°F | Resp 18 | Wt 142.0 lb

## 2012-03-03 DIAGNOSIS — I4891 Unspecified atrial fibrillation: Secondary | ICD-10-CM

## 2012-03-03 DIAGNOSIS — E7800 Pure hypercholesterolemia, unspecified: Secondary | ICD-10-CM

## 2012-03-03 DIAGNOSIS — I428 Other cardiomyopathies: Secondary | ICD-10-CM

## 2012-03-03 DIAGNOSIS — F0390 Unspecified dementia without behavioral disturbance: Secondary | ICD-10-CM

## 2012-03-03 DIAGNOSIS — I10 Essential (primary) hypertension: Secondary | ICD-10-CM

## 2012-03-03 NOTE — Patient Instructions (Signed)
Limit your sodium (Salt) intake  Return in 4 months for follow-up  

## 2012-03-03 NOTE — Progress Notes (Signed)
Subjective:    Patient ID: [name], male    DOB: 08-10-1923, 78 y.o.   MRN: [id number]  HPI  78 year old patient who is brought in by his daughter for followup. He has a history of worsening dementia and they are in the process of placement patient in an extended care facility. He requires 24 7 assistance and has significant sundowning and wonders through the night. There is worsening confusion. He has a history of atrial fibrillation and remains on aspirin do to a high fall risk. He has hypertension and dyslipidemia. He has cerebral vascular disease and peripheral vascular disease including AAA carotid and renal artery stenosis. His cardiopulmonary status has been fairly stable  Past Medical History  Diagnosis Date  . Atrial fibrillation   . AAA (abdominal aortic aneurysm)   . Carotid stenosis   . Hypercholesteremia   . Cardiomyopathy     improved  . Renal artery stenosis   . COPD (chronic obstructive pulmonary disease)   . Osteoarthritis   . Prostate cancer   . Atrial flutter     s/p ablation  . Hip fracture   . DEMENTIA   . Hypertension   . Macular degeneration disease     History   Social History  . Marital Status: Widowed    Spouse Name: N/A    Number of Children: N/A  . Years of Education: N/A   Occupational History  . Not on file.   Social History Main Topics  . Smoking status: Former Smoker -- 1.0 packs/day for 15 years    Types: Cigarettes    Quit date: 01/21/2001  . Smokeless tobacco: Former Neurosurgeon  . Alcohol Use: No  . Drug Use: No  . Sexually Active:    Other Topics Concern  . Not on file   Social History Narrative   ** Merged History Encounter **     Past Surgical History  Procedure Date  . Appendectomy   . Cholecystectomy   . Tonsillectomy     Family History  Problem Relation Age of Onset  . Heart attack Father     No Known Allergies  Current Outpatient Prescriptions on File Prior to Visit  Medication Sig Dispense Refill  .  aspirin EC 81 MG tablet Take 81 mg by mouth daily.        . Diclofenac Sodium 3 % GEL Place 1 application onto the skin 2 (two) times daily.  12 Tube  6  . diltiazem (CARDIZEM CD) 180 MG 24 hr capsule TAKE ONE CAPSULE BY MOUTH EVERY DAY  30 capsule  12  . donepezil (ARICEPT ODT) 10 MG disintegrating tablet Take 1 tablet (10 mg total) by mouth every morning.  90 tablet  0  . KLOR-CON M20 20 MEQ tablet TAKE ONE TABLET BY MOUTH TWICE DAILY  180 each  3  . metoprolol (LOPRESSOR) 50 MG tablet Take 50 mg by mouth 2 (two) times daily.      . metoprolol (LOPRESSOR) 50 MG tablet TAKE ONE TABLET BY MOUTH TWICE DAILY  180 tablet  3  . pravastatin (PRAVACHOL) 40 MG tablet Take 40 mg by mouth at bedtime.       . pravastatin (PRAVACHOL) 40 MG tablet TAKE ONE TABLET BY MOUTH AT BEDTIME  90 tablet  3  . risperiDONE (RISPERDAL) 0.25 MG tablet TAKE ONE TABLET BY MOUTH AT BEDTIME  30 tablet  5    BP 130/80  Pulse 93  Temp 97.6 F (36.4 C) (Oral)  Resp 18  Wt 142 lb (64.411 kg)  SpO2 97%      Review of Systems  Constitutional: Negative for fever, chills, appetite change and fatigue.  HENT: Negative for hearing loss, ear pain, congestion, sore throat, trouble swallowing, neck stiffness, dental problem, voice change and tinnitus.   Eyes: Negative for pain, discharge and visual disturbance.  Respiratory: Negative for cough, chest tightness, wheezing and stridor.   Cardiovascular: Negative for chest pain, palpitations and leg swelling.  Gastrointestinal: Negative for nausea, vomiting, abdominal pain, diarrhea, constipation, blood in stool and abdominal distention.  Genitourinary: Negative for urgency, hematuria, flank pain, discharge, difficulty urinating and genital sores.  Musculoskeletal: Positive for back pain and gait problem. Negative for myalgias, joint swelling and arthralgias.  Skin: Negative for rash.  Neurological: Positive for weakness. Negative for dizziness, syncope, speech difficulty,  numbness and headaches.  Hematological: Negative for adenopathy. Does not bruise/bleed easily.  Psychiatric/Behavioral: Positive for confusion and decreased concentration. Negative for behavioral problems and dysphoric mood. The patient is not nervous/anxious.        Objective:   Physical Exam  Constitutional: He is oriented to person, place, and time. He appears well-developed and well-nourished. No distress.       Pleasant but confused. Blood pressure 130/74  HENT:  Head: Normocephalic.  Right Ear: External ear normal.  Left Ear: External ear normal.  Eyes: Conjunctivae normal and EOM are normal.  Neck: Normal range of motion.  Cardiovascular: Normal rate and normal heart sounds.        Irregular rhythm with controlled ventricular response  Pulmonary/Chest: Breath sounds normal.  Abdominal: Bowel sounds are normal.  Musculoskeletal: Normal range of motion. He exhibits no edema and no tenderness.  Neurological: He is alert and oriented to person, place, and time.  Psychiatric: He has a normal mood and affect. His behavior is normal.          Assessment & Plan:   Chronic atrial fibrillation COPD Mod severe dementia Hypertension  FL2 forms completed Medical regimen unchanged Return here in 4 months or as needed

## 2012-06-04 ENCOUNTER — Ambulatory Visit: Payer: Self-pay | Admitting: Internal Medicine

## 2012-06-05 ENCOUNTER — Ambulatory Visit (INDEPENDENT_AMBULATORY_CARE_PROVIDER_SITE_OTHER): Payer: Medicare Other | Admitting: Internal Medicine

## 2012-06-05 DIAGNOSIS — Z299 Encounter for prophylactic measures, unspecified: Secondary | ICD-10-CM

## 2012-06-05 DIAGNOSIS — Z0279 Encounter for issue of other medical certificate: Secondary | ICD-10-CM

## 2012-06-08 ENCOUNTER — Encounter: Payer: Self-pay | Admitting: Internal Medicine

## 2012-06-08 ENCOUNTER — Ambulatory Visit (INDEPENDENT_AMBULATORY_CARE_PROVIDER_SITE_OTHER): Payer: Medicare Other | Admitting: Internal Medicine

## 2012-06-08 ENCOUNTER — Ambulatory Visit: Payer: Self-pay | Admitting: Internal Medicine

## 2012-06-08 VITALS — BP 130/90 | HR 68 | Temp 97.0°F | Resp 20 | Wt 138.0 lb

## 2012-06-08 DIAGNOSIS — R8290 Unspecified abnormal findings in urine: Secondary | ICD-10-CM

## 2012-06-08 DIAGNOSIS — E7800 Pure hypercholesterolemia, unspecified: Secondary | ICD-10-CM

## 2012-06-08 DIAGNOSIS — F0390 Unspecified dementia without behavioral disturbance: Secondary | ICD-10-CM

## 2012-06-08 DIAGNOSIS — I10 Essential (primary) hypertension: Secondary | ICD-10-CM

## 2012-06-08 DIAGNOSIS — R82998 Other abnormal findings in urine: Secondary | ICD-10-CM

## 2012-06-08 DIAGNOSIS — I4891 Unspecified atrial fibrillation: Secondary | ICD-10-CM

## 2012-06-08 LAB — POCT URINALYSIS DIPSTICK
Ketones, UA: NEGATIVE
Leukocytes, UA: NEGATIVE
Protein, UA: NEGATIVE
Urobilinogen, UA: 0.2

## 2012-06-08 NOTE — Progress Notes (Signed)
Subjective:    Patient ID: [name], male    DOB: 12-Jun-1923, 79 y.o.   MRN: [id number]  HPI   79 year old patient who has multiple medical problems this includes chronic atrial for ablation hypertension dementia and dyslipidemia. Family concerned about dark foul-smelling urine. A UA was reviewed and was unremarkable. Patient has no complaints today no cardiopulmonary complaints.  Past Medical History  Diagnosis Date  . Atrial fibrillation   . AAA (abdominal aortic aneurysm)   . Carotid stenosis   . Hypercholesteremia   . Cardiomyopathy     improved  . Renal artery stenosis   . COPD (chronic obstructive pulmonary disease)   . Osteoarthritis   . Prostate cancer   . Atrial flutter     s/p ablation  . Hip fracture   . DEMENTIA   . Hypertension   . Macular degeneration disease     History   Social History  . Marital Status: Widowed    Spouse Name: N/A    Number of Children: N/A  . Years of Education: N/A   Occupational History  . Not on file.   Social History Main Topics  . Smoking status: Former Smoker -- 1.00 packs/day for 15 years    Types: Cigarettes    Quit date: 01/21/2001  . Smokeless tobacco: Former Neurosurgeon  . Alcohol Use: No  . Drug Use: No  . Sexually Active:    Other Topics Concern  . Not on file   Social History Narrative   ** Merged History Encounter **        Past Surgical History  Procedure Laterality Date  . Appendectomy    . Cholecystectomy    . Tonsillectomy      Family History  Problem Relation Age of Onset  . Heart attack Father     No Known Allergies  Current Outpatient Prescriptions on File Prior to Visit  Medication Sig Dispense Refill  . aspirin EC 81 MG tablet Take 81 mg by mouth daily.        . Diclofenac Sodium 3 % GEL Place 1 application onto the skin 2 (two) times daily.  12 Tube  6  . diltiazem (CARDIZEM CD) 180 MG 24 hr capsule TAKE ONE CAPSULE BY MOUTH EVERY DAY  30 capsule  12  . donepezil (ARICEPT ODT) 10 MG  disintegrating tablet Take 1 tablet (10 mg total) by mouth every morning.  90 tablet  0  . KLOR-CON M20 20 MEQ tablet TAKE ONE TABLET BY MOUTH TWICE DAILY  180 each  3  . metoprolol (LOPRESSOR) 50 MG tablet Take 50 mg by mouth 2 (two) times daily.      . metoprolol (LOPRESSOR) 50 MG tablet TAKE ONE TABLET BY MOUTH TWICE DAILY  180 tablet  3  . pravastatin (PRAVACHOL) 40 MG tablet Take 40 mg by mouth at bedtime.       . pravastatin (PRAVACHOL) 40 MG tablet TAKE ONE TABLET BY MOUTH AT BEDTIME  90 tablet  3  . risperiDONE (RISPERDAL) 0.25 MG tablet TAKE ONE TABLET BY MOUTH AT BEDTIME  30 tablet  5   No current facility-administered medications on file prior to visit.    BP 130/90  Pulse 68  Temp(Src) 97 F (36.1 C) (Oral)  Resp 20  Wt 138 lb (62.596 kg)  BMI 17.71 kg/m2       Review of Systems  Constitutional: Negative for fever, chills, appetite change and fatigue.  HENT: Negative for hearing loss, ear pain, congestion,  sore throat, trouble swallowing, neck stiffness, dental problem, voice change and tinnitus.   Eyes: Negative for pain, discharge and visual disturbance.  Respiratory: Negative for cough, chest tightness, wheezing and stridor.   Cardiovascular: Negative for chest pain, palpitations and leg swelling.  Gastrointestinal: Negative for nausea, vomiting, abdominal pain, diarrhea, constipation, blood in stool and abdominal distention.  Genitourinary: Negative for urgency, hematuria, flank pain, discharge, difficulty urinating and genital sores.  Musculoskeletal: Negative for myalgias, back pain, joint swelling, arthralgias and gait problem.  Skin: Negative for rash.  Neurological: Negative for dizziness, syncope, speech difficulty, weakness, numbness and headaches.  Hematological: Negative for adenopathy. Does not bruise/bleed easily.  Psychiatric/Behavioral: Positive for behavioral problems and confusion. Negative for dysphoric mood. The patient is not nervous/anxious.         Objective:   Physical Exam  Constitutional: He is oriented to person, place, and time. He appears well-developed.  Blood pressure 130/80  HENT:  Head: Normocephalic.  Right Ear: External ear normal.  Left Ear: External ear normal.  Eyes: Conjunctivae and EOM are normal.  Neck: Normal range of motion.  Cardiovascular: Normal rate and normal heart sounds.   Controlled ventricular response  Pulmonary/Chest: Breath sounds normal.  Abdominal: Bowel sounds are normal.  Musculoskeletal: Normal range of motion. He exhibits no edema and no tenderness.  Neurological: He is alert and oriented to person, place, and time.  Psychiatric: He has a normal mood and affect. His behavior is normal.          Assessment & Plan:   Hypertension Dementia Normal urinalysis without evidence of UTI Atrial fibrillation controlled ventricular response. We'll continue rate control medication  Recheck 3 months or when necessary

## 2012-06-08 NOTE — Patient Instructions (Signed)
Limit your sodium (Salt) intake  Return in 3 months for follow-up   

## 2012-06-16 ENCOUNTER — Other Ambulatory Visit: Payer: Self-pay | Admitting: Internal Medicine

## 2012-06-19 ENCOUNTER — Other Ambulatory Visit: Payer: Self-pay | Admitting: Internal Medicine

## 2012-07-10 ENCOUNTER — Encounter (INDEPENDENT_AMBULATORY_CARE_PROVIDER_SITE_OTHER): Payer: Medicare Other

## 2012-07-10 DIAGNOSIS — I70219 Atherosclerosis of native arteries of extremities with intermittent claudication, unspecified extremity: Secondary | ICD-10-CM

## 2012-07-10 DIAGNOSIS — I739 Peripheral vascular disease, unspecified: Secondary | ICD-10-CM

## 2012-07-10 DIAGNOSIS — L97909 Non-pressure chronic ulcer of unspecified part of unspecified lower leg with unspecified severity: Secondary | ICD-10-CM

## 2012-07-23 ENCOUNTER — Telehealth: Payer: Self-pay | Admitting: Internal Medicine

## 2012-07-23 MED ORDER — DILTIAZEM HCL ER COATED BEADS 180 MG PO CP24: ORAL_CAPSULE | ORAL | Status: DC

## 2012-07-23 NOTE — Addendum Note (Signed)
Addended by: Jimmye Norman on: 07/23/2012 02:03 PM   Modules accepted: Orders

## 2012-07-23 NOTE — Telephone Encounter (Signed)
PT daughter called to request a refill of his diltiazem (CARDIZEM CD) 180 MG 24 hr capsule. He has been out of this for two days, as the pharmacy stated that they sent over request for the last week. Please assist. She would like this sent to [facility] on [address]. She would also like to speak with someone about requesting resbit care for her father. Please assist.

## 2012-07-23 NOTE — Telephone Encounter (Signed)
Spoke to pt's son told him Rx was sent to pharmacy and I had not received a request from the pharmacy.

## 2012-07-29 ENCOUNTER — Encounter: Payer: Self-pay | Admitting: Cardiology

## 2012-07-29 NOTE — Progress Notes (Signed)
HPI: [name] is a pleasant gentleman with hypertension, history of atrial flutter ablation, and vascular disease. He also has atrial fibrillation that we are treating with rate control and anticoagulation. Previous cardiac catheterization in October 2006 showed nonobstructive coronary disease and an ejection fraction of 55%. There was a 90% left and 75% right renal artery stenosis. He had previous stenting of his left renal artery in October of 2007. Last echocardiogram in Feb 2012 showed nl LV function. There was mild mitral regurgitation. The left atrium was moderately dilated. The right was markedly dilated. The right ventricle was mildly dilated. Carotid Dopplers in February of 2012 showed 0-39% stenosis bilaterally and F/U recommended in 2 years. Last abdominal ultrasound in March showed AAA of 4.2 x 4.1 cml dilated common iliacs; fu recommended in six months. ABIs with Doppler in April of 2014 showed significant right iliac disease, severe stenosis of the distal right SFA, probable bilateral tibial occlusive disease. Since I last saw him in July of 2012,    Current Outpatient Prescriptions  Medication Sig Dispense Refill  . aspirin EC 81 MG tablet Take 81 mg by mouth daily.        . Diclofenac Sodium 3 % GEL Place 1 application onto the skin 2 (two) times daily.  12 Tube  6  . diltiazem (CARDIZEM CD) 180 MG 24 hr capsule TAKE ONE CAPSULE BY MOUTH EVERY DAY  30 capsule  12  . donepezil (ARICEPT ODT) 10 MG disintegrating tablet Take 1 tablet (10 mg total) by mouth every morning.  90 tablet  0  . KLOR-CON M20 20 MEQ tablet TAKE ONE TABLET BY MOUTH TWICE DAILY  180 each  3  . metoprolol (LOPRESSOR) 50 MG tablet Take 50 mg by mouth 2 (two) times daily.      . metoprolol (LOPRESSOR) 50 MG tablet TAKE ONE TABLET BY MOUTH TWICE DAILY  180 tablet  3  . pravastatin (PRAVACHOL) 40 MG tablet Take 40 mg by mouth at bedtime.       . pravastatin (PRAVACHOL) 40 MG tablet TAKE ONE TABLET BY MOUTH AT BEDTIME   90 tablet  3  . risperiDONE (RISPERDAL) 0.25 MG tablet TAKE ONE TABLET BY MOUTH EVERY DAY AT BEDTIME  30 tablet  0  . risperiDONE (RISPERDAL) 0.25 MG tablet TAKE ONE TABLET BY MOUTH EVERY DAY AT BEDTIME  30 tablet  1   No current facility-administered medications for this visit.     Past Medical History  Diagnosis Date  . Atrial fibrillation   . AAA (abdominal aortic aneurysm)   . Carotid stenosis   . Hypercholesteremia   . Cardiomyopathy     improved  . Renal artery stenosis   . COPD (chronic obstructive pulmonary disease)   . Osteoarthritis   . Prostate cancer   . Atrial flutter     s/p ablation  . Hip fracture   . DEMENTIA   . Hypertension   . Macular degeneration disease     Past Surgical History  Procedure Laterality Date  . Appendectomy    . Cholecystectomy    . Tonsillectomy      History   Social History  . Marital Status: Widowed    Spouse Name: N/A    Number of Children: N/A  . Years of Education: N/A   Occupational History  . Not on file.   Social History Main Topics  . Smoking status: Former Smoker -- 1.00 packs/day for 15 years    Types: Cigarettes  Quit date: 01/21/2001  . Smokeless tobacco: Former Neurosurgeon  . Alcohol Use: No  . Drug Use: No  . Sexually Active:    Other Topics Concern  . Not on file   Social History Narrative   ** Merged History Encounter **        ROS: no fevers or chills, productive cough, hemoptysis, dysphasia, odynophagia, melena, hematochezia, dysuria, hematuria, rash, seizure activity, orthopnea, PND, pedal edema, claudication. Remaining systems are negative.  Physical Exam: Well-developed well-nourished in no acute distress.  Skin is warm and dry.  HEENT is normal.  Neck is supple.  Chest is clear to auscultation with normal expansion.  Cardiovascular exam is regular rate and rhythm.  Abdominal exam nontender or distended. No masses palpated. Extremities show no edema. neuro grossly  intact  ECG     This encounter was created in error - please disregard.

## 2012-08-11 ENCOUNTER — Ambulatory Visit (INDEPENDENT_AMBULATORY_CARE_PROVIDER_SITE_OTHER): Payer: Medicare Other | Admitting: Cardiovascular Disease

## 2012-08-11 ENCOUNTER — Encounter: Payer: Self-pay | Admitting: Cardiovascular Disease

## 2012-08-11 VITALS — BP 149/92 | HR 66 | Wt 138.0 lb

## 2012-08-11 DIAGNOSIS — I6529 Occlusion and stenosis of unspecified carotid artery: Secondary | ICD-10-CM

## 2012-08-11 DIAGNOSIS — I739 Peripheral vascular disease, unspecified: Secondary | ICD-10-CM

## 2012-08-11 DIAGNOSIS — I4891 Unspecified atrial fibrillation: Secondary | ICD-10-CM

## 2012-08-11 NOTE — Patient Instructions (Addendum)
Your physician recommends that you follow-up as needed.  Your physician recommends that you continue on your current medications as directed. Please refer to the Current Medication list given to you today.

## 2012-08-11 NOTE — Progress Notes (Signed)
HPI  This is an 79 year old man who was referred by Dr. Leeanne Deed for evaluation and management of peripheral arterial disease. The patient has known history of chronic atrial fibrillation with previous atrial flutter ablation. He is not on anticoagulation due to recurrent GI bleed and falls. He had previous cardiac catheterization which showed mild nonobstructive coronary artery disease. He also had previous left renal artery stenting.his functional capacity is poor and he walks slowly with a walker. He denies any discomfort. He had recurrent small ulceration involving the big toes which required intermittent antibiotic treatment. He currently has a slowly healing ulceration at the tip of the left big toe. No ulcerations on the right side. He denies any discomfort.   No Known Allergies   Current Outpatient Prescriptions on File Prior to Visit  Medication Sig Dispense Refill  . aspirin EC 81 MG tablet Take 81 mg by mouth daily.        Marland Kitchen diltiazem (CARDIZEM CD) 180 MG 24 hr capsule TAKE ONE CAPSULE BY MOUTH EVERY DAY  30 capsule  12  . KLOR-CON M20 20 MEQ tablet TAKE ONE TABLET BY MOUTH TWICE DAILY  180 each  3  . metoprolol (LOPRESSOR) 50 MG tablet Take 50 mg by mouth 2 (two) times daily.      . pravastatin (PRAVACHOL) 40 MG tablet Take 40 mg by mouth at bedtime.       . risperiDONE (RISPERDAL) 0.25 MG tablet TAKE ONE TABLET BY MOUTH EVERY DAY AT BEDTIME  30 tablet  0   No current facility-administered medications on file prior to visit.     Past Medical History  Diagnosis Date  . Atrial fibrillation   . AAA (abdominal aortic aneurysm)   . Carotid stenosis   . Hypercholesteremia   . Cardiomyopathy     improved  . Renal artery stenosis   . COPD (chronic obstructive pulmonary disease)   . Osteoarthritis   . Prostate cancer   . Atrial flutter     s/p ablation  . Hip fracture   . DEMENTIA   . Hypertension   . Macular degeneration disease      Past Surgical History  Procedure  Laterality Date  . Appendectomy    . Cholecystectomy    . Tonsillectomy       Family History  Problem Relation Age of Onset  . Heart attack Father      History   Social History  . Marital Status: Widowed    Spouse Name: N/A    Number of Children: N/A  . Years of Education: N/A   Occupational History  . Not on file.   Social History Main Topics  . Smoking status: Former Smoker -- 1.00 packs/day for 15 years    Types: Cigarettes    Quit date: 01/21/2001  . Smokeless tobacco: Former Neurosurgeon  . Alcohol Use: No  . Drug Use: No  . Sexually Active:    Other Topics Concern  . Not on file   Social History Narrative   ** Merged History Encounter **         PHYSICAL EXAM   BP 149/92  Pulse 66  Wt 138 lb (62.596 kg)  BMI 17.71 kg/m2 Constitutional: He is oriented to person, place, and time. He appears well-developed and well-nourished. No distress.  HENT: No nasal discharge.  Head: Normocephalic and atraumatic.  Eyes: Pupils are equal and round. Right eye exhibits no discharge. Left eye exhibits no discharge.  Neck: Normal range of motion.  Neck supple. No JVD present. No thyromegaly present.  Cardiovascular: Normal rate, irregular rhythm, normal heart sounds and. Exam reveals no gallop and no friction rub. No murmur heard.  Pulmonary/Chest: Effort normal and breath sounds normal. No stridor. No respiratory distress. He has no wheezes. He has no rales. He exhibits no tenderness.  Abdominal: Soft. Bowel sounds are normal. He exhibits no distension. There is no tenderness. There is no rebound and no guarding.  Musculoskeletal: Normal range of motion. He exhibits no edema and no tenderness.  Neurological: He is alert and oriented to person, place, and time. Coordination normal.  Skin: Skin is warm and dry. No rash noted. He is not diaphoretic. No erythema. No pallor.  Psychiatric: He has a normal mood and affect. His behavior is normal. Judgment and thought content normal.    Vascular: Femoral pulses are normal bilaterally. Distal pulses are not palpable. There is a small ulceration at the left big toe.      EKG: atrial fibrillation with a ventricular rate of 66 beats per minute.    ASSESSMENT AND PLAN

## 2012-08-11 NOTE — Assessment & Plan Note (Signed)
The patient has evidence of peripheral arterial disease bilaterally. ABI was mildly reduced on both sides. however, this is likely underestimating the severity of the disease due to significant calcifications. He has no symptoms of claudication or rest pain. There is a small ulceration involving the left big toe. Arterial duplex showed evidence of right iliac and right SFA disease with bilateral tibial disease. There is no indication for intervention on the right side at this point given that he seems to be asymptomatic on that side. There is no potential areas of intervention on the left side above the knee. Intervening on below the knee arteries should be reserved for critical limb ischemia in this situation especially with his poor functional capacity and previous recurrent GI bleed which would make it difficult to give him another antiplatelet medication. Due to that, I recommend observation for now and reserving angiography for critical limb ischemia. He is not a smoker and does not have diabetes so hopefully he will not have significant progression.

## 2012-08-17 ENCOUNTER — Telehealth: Payer: Self-pay | Admitting: Internal Medicine

## 2012-08-17 NOTE — Telephone Encounter (Addendum)
Pt daughter is requesting respite care at [facility] [phone] for 1 wk. Pt has heart problems, alzheimer's and macular degeneration. Pt daugh is aware MD out of office this wk.

## 2012-08-24 NOTE — Telephone Encounter (Signed)
Spoke to pt's daughter [name] and told her need FL2 Form filled out and TB testing. [name] said he has already filled out 2 FL2 forms and they are incorrect she has another form to be filled out and pt had TB tesitng done. Told [name] okay bring by form and Dr. Amador Cunas will fill out. Pt verbalized understanding and stated will try to come by today.

## 2012-08-28 NOTE — Telephone Encounter (Signed)
Called [place] pt's daughter to see if dropped form off? [name] said no she has not had a chance to come by yet. Told her that is fine I just wanted to make sure that it did not get lost. [place] when she brings the form tell them to give it to Lupita Leash, Dr.K's nurse. Jena verbalized understanding.

## 2012-09-02 NOTE — Telephone Encounter (Signed)
Left detailed message that FL-2 Forms are ready for pick up will be at front desk.

## 2012-09-04 NOTE — Telephone Encounter (Signed)
Left detailed for [place] that FL-2 forms are ready for pick up.

## 2012-12-19 ENCOUNTER — Other Ambulatory Visit: Payer: Self-pay | Admitting: Internal Medicine

## 2012-12-23 ENCOUNTER — Other Ambulatory Visit: Payer: Self-pay | Admitting: Internal Medicine

## 2012-12-29 ENCOUNTER — Telehealth: Payer: Self-pay | Admitting: Internal Medicine
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
## 2013-01-31 ENCOUNTER — Emergency Department (HOSPITAL_COMMUNITY)
Admission: EM | Admit: 2013-01-31 | Discharge: 2013-01-31 | Disposition: A | Discharge status: Home / Self Care | Payer: Medicare Other | Attending: Emergency Medicine | Admitting: Emergency Medicine

## 2013-01-31 ENCOUNTER — Encounter (HOSPITAL_COMMUNITY): Payer: Self-pay | Admitting: Emergency Medicine

## 2013-01-31 DIAGNOSIS — Z87891 Personal history of nicotine dependence: Secondary | ICD-10-CM | POA: Insufficient documentation

## 2013-01-31 DIAGNOSIS — I10 Essential (primary) hypertension: Secondary | ICD-10-CM | POA: Insufficient documentation

## 2013-01-31 DIAGNOSIS — J449 Chronic obstructive pulmonary disease, unspecified: Secondary | ICD-10-CM | POA: Insufficient documentation

## 2013-01-31 DIAGNOSIS — J4489 Other specified chronic obstructive pulmonary disease: Secondary | ICD-10-CM | POA: Insufficient documentation

## 2013-01-31 DIAGNOSIS — R443 Hallucinations, unspecified: Secondary | ICD-10-CM | POA: Insufficient documentation

## 2013-01-31 DIAGNOSIS — F0391 Unspecified dementia with behavioral disturbance: Secondary | ICD-10-CM | POA: Insufficient documentation

## 2013-01-31 DIAGNOSIS — E7800 Pure hypercholesterolemia, unspecified: Secondary | ICD-10-CM | POA: Insufficient documentation

## 2013-01-31 DIAGNOSIS — I429 Cardiomyopathy, unspecified: Secondary | ICD-10-CM | POA: Insufficient documentation

## 2013-01-31 DIAGNOSIS — I4891 Unspecified atrial fibrillation: Secondary | ICD-10-CM | POA: Insufficient documentation

## 2013-01-31 DIAGNOSIS — M1990 Unspecified osteoarthritis, unspecified site: Secondary | ICD-10-CM | POA: Insufficient documentation

## 2013-01-31 DIAGNOSIS — Z79899 Other long term (current) drug therapy: Secondary | ICD-10-CM | POA: Insufficient documentation

## 2013-01-31 DIAGNOSIS — Z7982 Long term (current) use of aspirin: Secondary | ICD-10-CM | POA: Insufficient documentation

## 2013-01-31 DIAGNOSIS — R451 Restlessness and agitation: Secondary | ICD-10-CM

## 2013-01-31 DIAGNOSIS — F0390 Unspecified dementia without behavioral disturbance: Secondary | ICD-10-CM

## 2013-01-31 DIAGNOSIS — Z8546 Personal history of malignant neoplasm of prostate: Secondary | ICD-10-CM | POA: Insufficient documentation

## 2013-01-31 DIAGNOSIS — H3530 Unspecified macular degeneration: Secondary | ICD-10-CM | POA: Insufficient documentation

## 2013-01-31 DIAGNOSIS — I6529 Occlusion and stenosis of unspecified carotid artery: Secondary | ICD-10-CM | POA: Insufficient documentation

## 2013-01-31 DIAGNOSIS — I714 Abdominal aortic aneurysm, without rupture, unspecified: Secondary | ICD-10-CM | POA: Insufficient documentation

## 2013-01-31 DIAGNOSIS — I701 Atherosclerosis of renal artery: Secondary | ICD-10-CM | POA: Insufficient documentation

## 2013-01-31 DIAGNOSIS — F03918 Unspecified dementia, unspecified severity, with other behavioral disturbance: Secondary | ICD-10-CM | POA: Insufficient documentation

## 2013-01-31 LAB — ACETAMINOPHEN LEVEL: Acetaminophen (Tylenol), Serum: <15 ug/mL (ref 10–30)

## 2013-01-31 LAB — COMPREHENSIVE METABOLIC PANEL
ALT: 24 U/L (ref 0–53)
AST: 31 U/L (ref 0–37)
Albumin: 3.9 g/dL (ref 3.5–5.2)
Alkaline Phosphatase: 171 U/L — ABNORMAL HIGH (ref 39–117)
Potassium: 4.5 mEq/L (ref 3.5–5.1)
Sodium: 138 mEq/L (ref 135–145)
Total Protein: 7.2 g/dL (ref 6.0–8.3)

## 2013-01-31 LAB — URINALYSIS, ROUTINE W REFLEX MICROSCOPIC
Bilirubin Urine: NEGATIVE
Ketones, ur: NEGATIVE mg/dL
Nitrite: NEGATIVE
Urobilinogen, UA: 0.2 mg/dL (ref 0.0–1.0)

## 2013-01-31 LAB — RAPID URINE DRUG SCREEN, HOSP PERFORMED
Barbiturates: NOT DETECTED
Benzodiazepines: NOT DETECTED
Cocaine: NOT DETECTED
Tetrahydrocannabinol: NOT DETECTED

## 2013-01-31 LAB — CBC
Hemoglobin: 14.3 g/dL (ref 13.0–17.0)
MCHC: 34.2 g/dL (ref 30.0–36.0)
RDW: 14.9 % (ref 11.5–15.5)

## 2013-01-31 MED ORDER — LORAZEPAM 1 MG PO TABS
1.0000 mg | ORAL_TABLET | Freq: Once | ORAL | Status: AC
  Administered 2013-01-31: 1 mg via ORAL
  Filled 2013-01-31: qty 1

## 2013-01-31 MED ORDER — SODIUM CHLORIDE 0.9 % IV BOLUS (SEPSIS)
500.0000 mL | Freq: Once | INTRAVENOUS | Status: AC
  Administered 2013-01-31: 500 mL via INTRAVENOUS

## 2013-01-31 MED ORDER — LORAZEPAM 1 MG PO TABS: 1.0000 mg | ORAL_TABLET | Freq: Every day | ORAL | Status: DC

## 2013-01-31 NOTE — ED Notes (Signed)
Spoke with Drinda Butts Case Production designer, theatre/television/film on call to try help pt's family with home care as expressed by the daughter.

## 2013-01-31 NOTE — ED Notes (Addendum)
Pt reports pt has not slept for about 29 1/2 hours. Pt with increase thirst, auditory and visiual hallucinations, and aggressive behavior. Family reports found pt sitting on a bucket in their kitchen trying to go to the bathroom.

## 2013-01-31 NOTE — ED Provider Notes (Signed)
CSN: [id number]     Arrival date & time 01/31/13  1758 History   First MD Initiated Contact with Patient 01/31/13 1831     Chief Complaint  Patient presents with  . Fall  . Insomnia  . Aggressive Behavior  . Hallucinations   (Consider location/radiation/quality/duration/timing/severity/associated sxs/prior Treatment) HPI 79 y.o. Male with known dementia who presents with his daughter who is his caregiver.  She states he has been agitated and aggressive for several days.  He has not been sleeping.  He has been taking his medications.  She has not noted any fever, vomiting, or lethargy.  He has been in a nh in the past but she states he "used up his time".  He is not currently on blood thinners.  She has not noted focal deficits.  She thinks he may be urinating more than usual.  Past Medical History  Diagnosis Date  . Atrial fibrillation   . AAA (abdominal aortic aneurysm)   . Carotid stenosis   . Hypercholesteremia   . Cardiomyopathy     improved  . Renal artery stenosis   . COPD (chronic obstructive pulmonary disease)   . Osteoarthritis   . Prostate cancer   . Atrial flutter     s/p ablation  . Hip fracture   . DEMENTIA   . Hypertension   . Macular degeneration disease    Past Surgical History  Procedure Laterality Date  . Appendectomy    . Cholecystectomy    . Tonsillectomy     Family History  Problem Relation Age of Onset  . Heart attack Father    History  Substance Use Topics  . Smoking status: Former Smoker -- 1.00 packs/day for 15 years    Types: Cigarettes    Quit date: 01/21/2001  . Smokeless tobacco: Former Neurosurgeon  . Alcohol Use: No    Review of Systems  Unable to perform ROS   Allergies  Review of patient's allergies indicates no known allergies.  Home Medications   Current Outpatient Rx  Name  Route  Sig  Dispense  Refill  . aspirin EC 81 MG tablet   Oral   Take 81 mg by mouth every evening.          . diltiazem (CARDIZEM CD) 180 MG 24 hr  capsule   Oral   Take 180 mg by mouth daily.         . metoprolol (LOPRESSOR) 50 MG tablet   Oral   Take 50 mg by mouth 2 (two) times daily.         . potassium chloride SA (K-DUR,KLOR-CON) 20 MEQ tablet   Oral   Take 20 mEq by mouth 2 (two) times daily.         . pravastatin (PRAVACHOL) 40 MG tablet   Oral   Take 40 mg by mouth at bedtime.          . risperiDONE (RISPERDAL) 0.25 MG tablet   Oral   Take 0.25 mg by mouth at bedtime.          BP 156/84  Pulse 92  Temp(Src) 97.7 F (36.5 C) (Oral)  Resp 21  Ht 6\' 3"  (1.905 m)  Wt 144 lb (65.318 kg)  BMI 18.00 kg/m2  SpO2 100% Physical Exam  Nursing note and vitals reviewed. Constitutional: He appears well-developed and well-nourished.  Thin well appearing man who is compliant with exam.   HENT:  Head: Normocephalic and atraumatic.  Right Ear: External ear normal.  Left  Ear: External ear normal.  Nose: Nose normal.  Mouth/Throat: Oropharynx is clear and moist.  Eyes: Conjunctivae and EOM are normal. Pupils are equal, round, and reactive to light.  Neck: Normal range of motion. Neck supple.  Cardiovascular: An irregularly irregular rhythm present.  Pulmonary/Chest: Effort normal and breath sounds normal.  Abdominal: Soft. Bowel sounds are normal.  Musculoskeletal: Normal range of motion.  Neurological: He is alert. He has normal reflexes. No cranial nerve deficit. Coordination normal.  Oriented to person and place but not time.   Skin: Skin is warm and dry.  Psychiatric:  Patient calm here, affect flat.  Behavior appears normal. Thought content and judgement c.w. Severe dementia.     ED Course  Procedures (including critical care time) Labs Review Labs Reviewed  CBC - Abnormal; Notable for the following:    WBC 12.6 (*)    All other components within normal limits  COMPREHENSIVE METABOLIC PANEL - Abnormal; Notable for the following:    Glucose, Bld 127 (*)    Alkaline Phosphatase 171 (*)    GFR calc  non Af Amer 83 (*)    All other components within normal limits  SALICYLATE LEVEL - Abnormal; Notable for the following:    Salicylate Lvl <2.0 (*)    All other components within normal limits  ACETAMINOPHEN LEVEL  ETHANOL  URINALYSIS, ROUTINE W REFLEX MICROSCOPIC  URINE RAPID DRUG SCREEN (HOSP PERFORMED)   Imaging Review No results found.  EKG Interpretation     Ventricular Rate:  75 PR Interval:    QRS Duration: 98 QT Interval:  429 QTC Calculation: 479 R Axis:   12 Text Interpretation:  Atrial fibrillation Anterior infarct, old            MDM  No diagnosis found. No acute metabolic or medical cause of agitation found in the ed.  The patient has been given ativan with an rx for qhs ativan.  The daughter is advised close follow up with pmd for further assistance managing his care with dementia.    Hilario Quarry, MD 02/02/13 [phone]

## 2013-02-01 ENCOUNTER — Telehealth: Payer: Self-pay | Admitting: Internal Medicine
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
## 2013-02-01 NOTE — Progress Notes (Signed)
On call NCM received call from [facility] RN/ED to speak with patient's daughter regarding home health assistance. Per Melony Overly the patient is confused, falling at home, not sleeping, per medical workup blood work wnl, no UTI. Spoke with Janna/daughter regarding home situation. She is the primary caregiver and needs more assistance at home, she had called the insurance company and they cannot help. She noted the patient has dementia, difficulty walking d/t arthritis. Discussed with her f/u with PCP. She had called them on Friday. NCM suggested she contact the PCP on Monday, advise of the ED visit and schedule an appointment to discuss her issues/concerns. Advised her the insurance company typically needs an MD order for any home care services.  Suggested she can also contact the Department of Aging or the ACE program for assistance. Edmon Crape states she has been dealing with this for over four years and appreciates the information, has heard it before. NCM expressed empathy for her situation and reinforced contacting the PCP.
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
## 2013-02-01 NOTE — Telephone Encounter (Signed)
Please see message and advise 

## 2013-02-02 ENCOUNTER — Other Ambulatory Visit: Payer: Self-pay | Admitting: *Deleted

## 2013-02-02 ENCOUNTER — Other Ambulatory Visit: Payer: Self-pay | Admitting: Internal Medicine

## 2013-02-02 DIAGNOSIS — J449 Chronic obstructive pulmonary disease, unspecified: Secondary | ICD-10-CM

## 2013-02-02 DIAGNOSIS — M1990 Unspecified osteoarthritis, unspecified site: Secondary | ICD-10-CM

## 2013-02-02 DIAGNOSIS — I739 Peripheral vascular disease, unspecified: Secondary | ICD-10-CM

## 2013-02-02 DIAGNOSIS — F0390 Unspecified dementia without behavioral disturbance: Secondary | ICD-10-CM

## 2013-02-02 MED ORDER — DONEPEZIL HCL 5 MG PO TABS: 5.0000 mg | ORAL_TABLET | Freq: Every day | ORAL | Status: DC

## 2013-02-02 NOTE — Telephone Encounter (Signed)
Dr. Kirtland Bouchard spoke with [name], Rx for Aricept sent to pharmacy.

## 2013-02-02 NOTE — Telephone Encounter (Signed)
Patient Information:  Caller Name: [name]  Phone: [phone]  Patient: Martin, [name]  Gender: Male  DOB: [birth year]  Age: 79 Years  PCP: [name] (Family Practice > 51yrs old)  Office Follow Up:  Does the office need to follow up with this patient?: Yes  Instructions For The Office: See RN note  krs/can  RN Note:  Daughter states patient is still combative and has been awake hallucinating all night.  States [facility] did call her back after she complained about the ED discharging him "doped up on Ativan" and stated that they spoke with Dr. Amador Cunas, and should expect a call back from Dr. Kirtland Bouchard. advising next steps 02/01/13.  States she never received a call back, and patient was still combative overnight.  States she is unwilling to take to ED unless admitted due to feeling the staff will not "do" anything for him.  States he finally went to sleep 0600 02/02/13 after being up x 39 hours.  Awakened 02/02/13 1130 and is extremely confused, and does not recognize family members, but is not combative currently.  TC to office for assistance; info to office for provider review/callback.  May reach daughter at [phone].  krs/can  Symptoms  Reason For Call & Symptoms: follow up regarding patient hallucinating  Reviewed Health History In EMR: Yes  Reviewed Medications In EMR: Yes  Reviewed Allergies In EMR: Yes  Reviewed Surgeries / Procedures: Yes  Date of Onset of Symptoms: 01/31/2013  Guideline(s) Used:  No Protocol Available - Sick Adult  Disposition Per Guideline:   Discuss with PCP and Callback by Nurse within 1 Hour  Reason For Disposition Reached:   Nursing judgment  Advice Given:  N/A  Patient Will Follow Care Advice:  YES

## 2013-02-05 IMAGING — CR DG KNEE COMPLETE 4+V*R*
4 series · 4 of 4 positions shown · non-contrast
Comparison: None

CLINICAL DATA: Weakness, fall

RIGHT KNEE - COMPLETE 4+ VIEW

[t knee ap right]
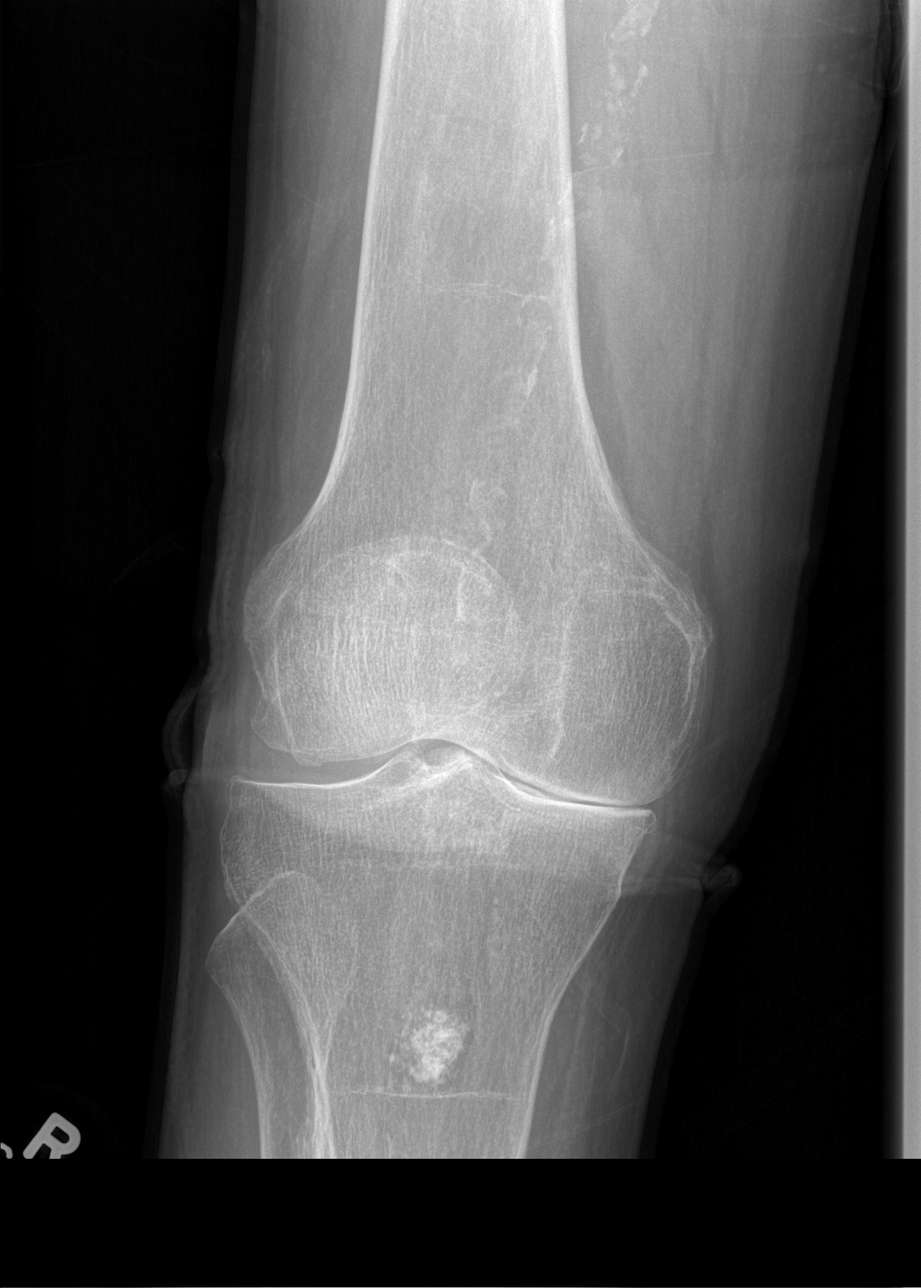

[t knee oblique right (1 of 2)]
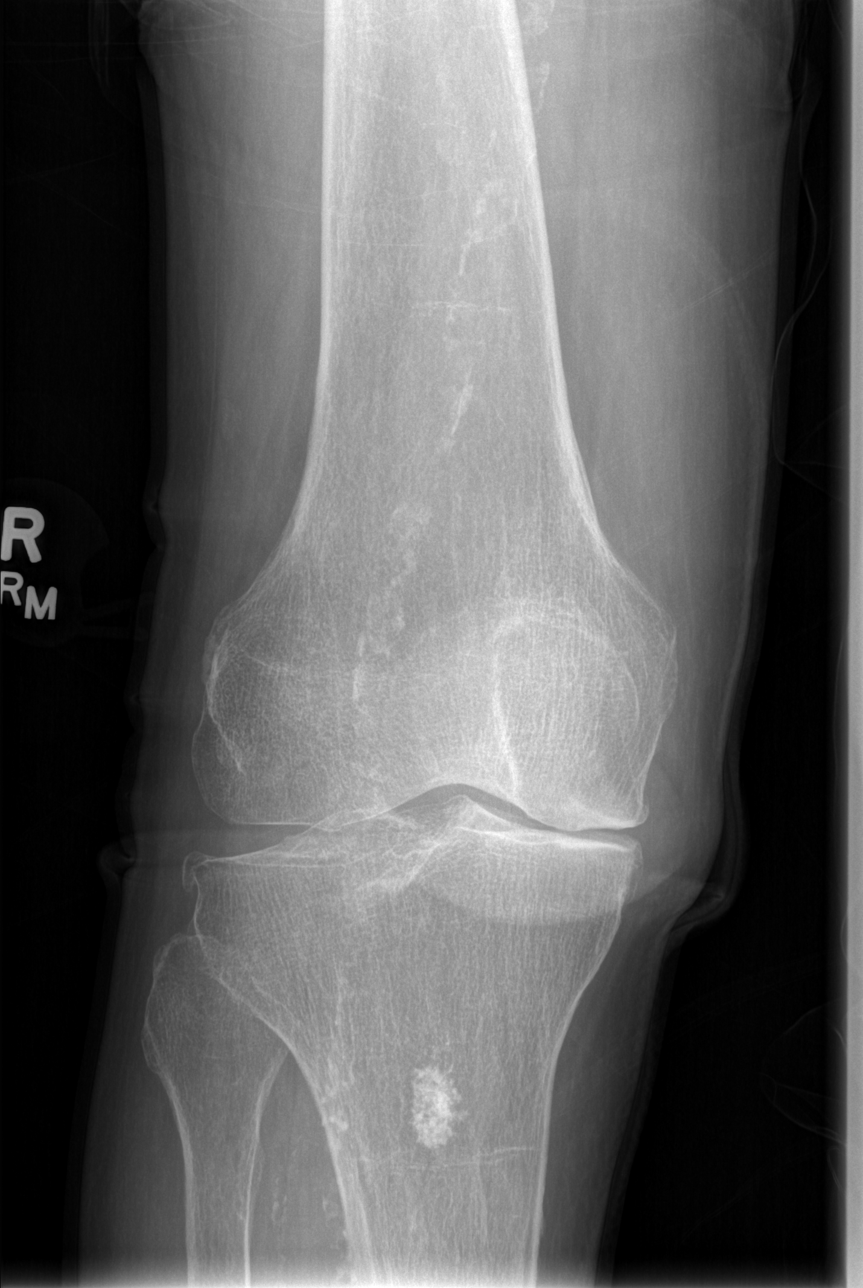

[t knee oblique right (2 of 2)]
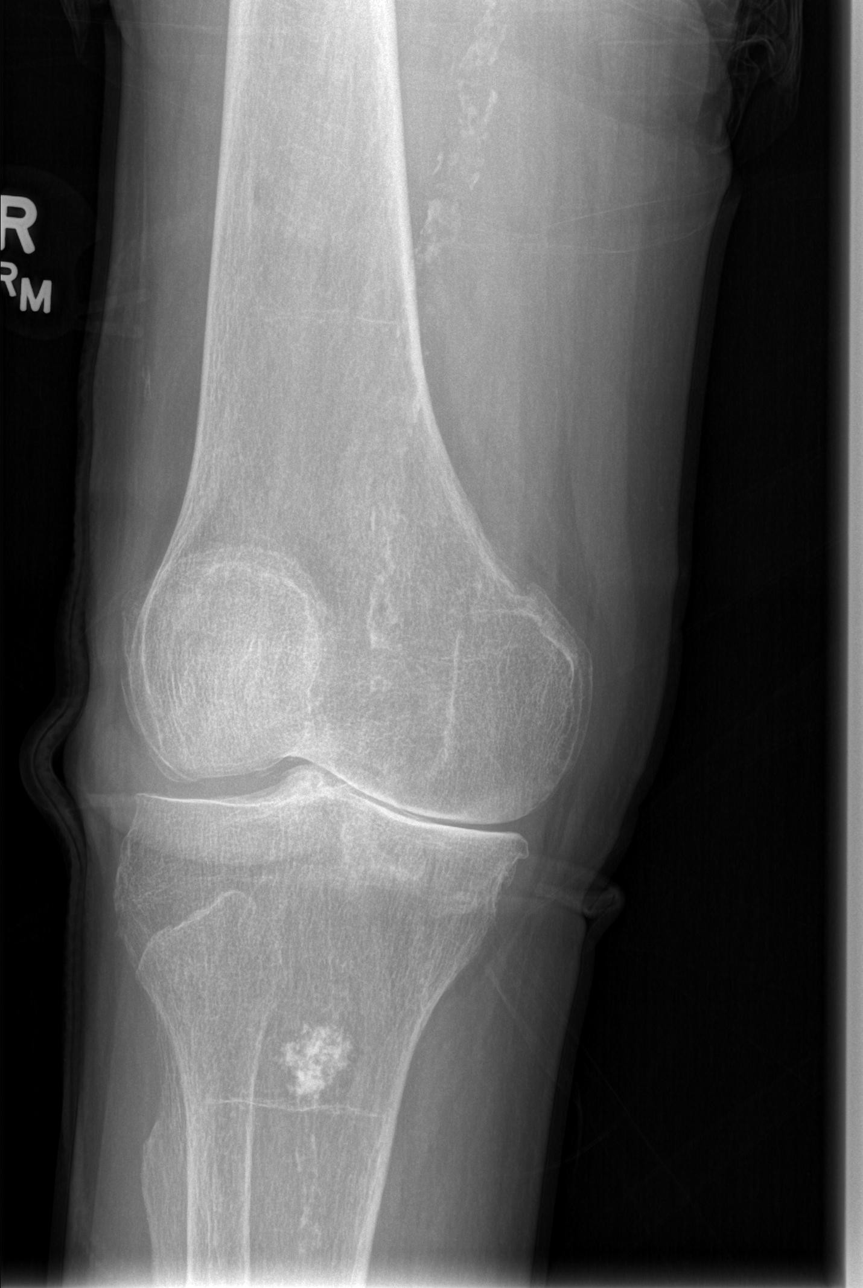

[t knee lat right]
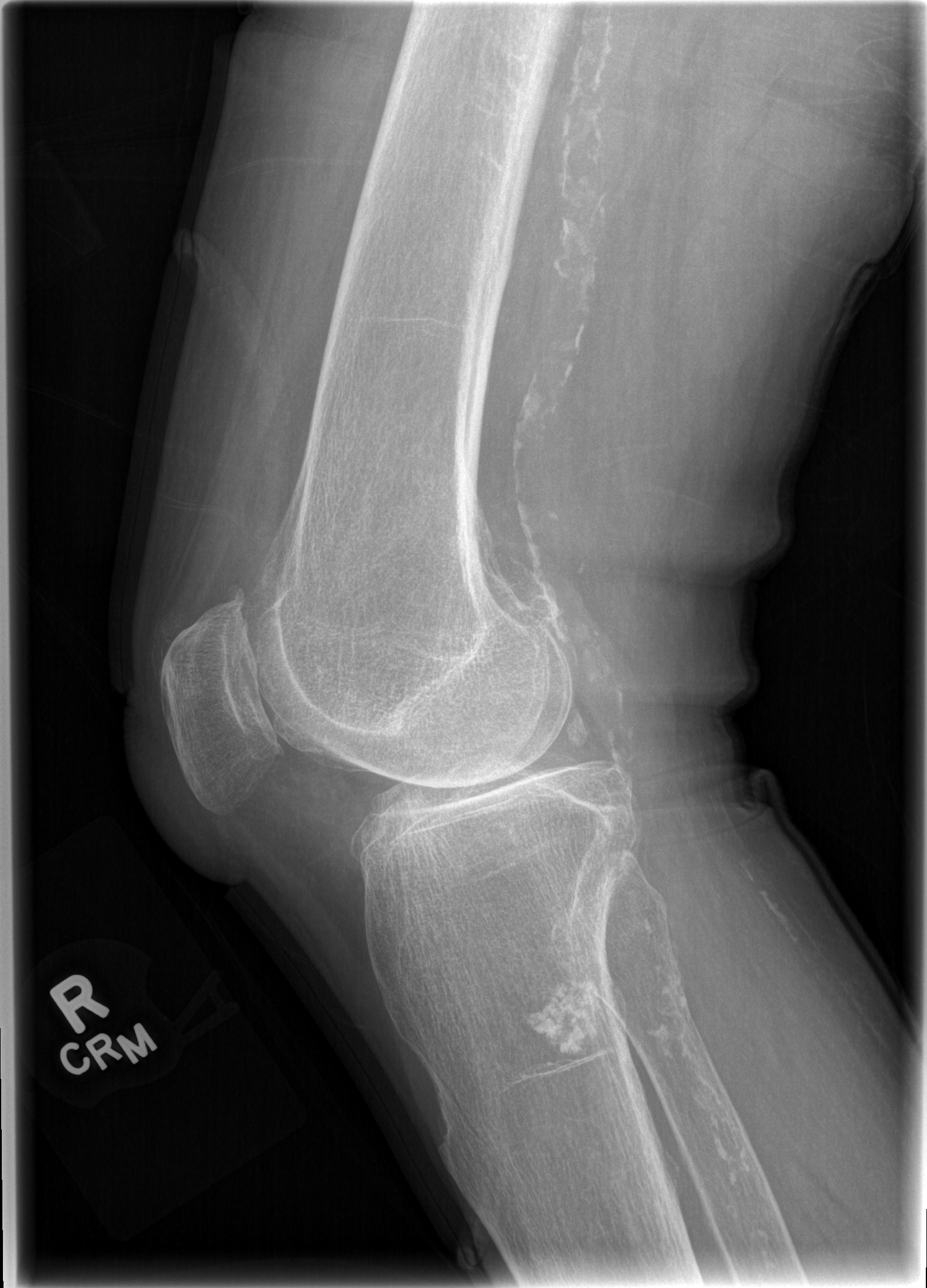

[4 of 4 positions shown; findings below may reference images not displayed]

FINDINGS: Osseous demineralization.
Osteoarthritic changes, greatest at medial compartment where marked
joint space narrowing is identified.
Minimal spur formation present at all three compartments.
No acute fracture, dislocation, or bone destruction.
No knee joint effusion.
Extensive atherosclerotic calcifications.
Focal calcific density at proximal tibia, 2 cm diameter, question
enchondroma.
IMPRESSION: Osseous demineralization with tricompartmental osteoarthritic
changes right knee.
No acute osseous abnormalities.

## 2013-02-05 IMAGING — CT CT HEAD W/O CM
1 of 2 series · 15 of 30 positions shown, 19 images · non-contrast
Comparison: 01/13/2005

CLINICAL DATA: Weakness, fell a few days ago

CT HEAD WITHOUT CONTRAST
TECHNIQUE: Contiguous axial images were obtained from the base of
the skull through the vertex without contrast.

[Series 2: head routine 4.8 h37s · axial · 0.47mm/px · z∈[-136,+16]mm · 15 of 36 slices shown, 19 images]
[im 2/36  brain]
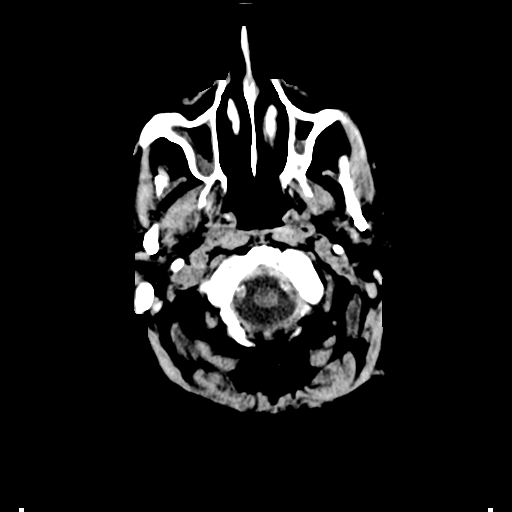
[im 2/36  bone]
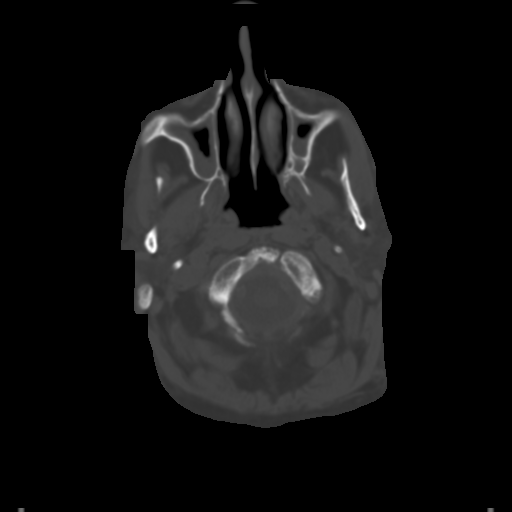
[im 6/36  brain]
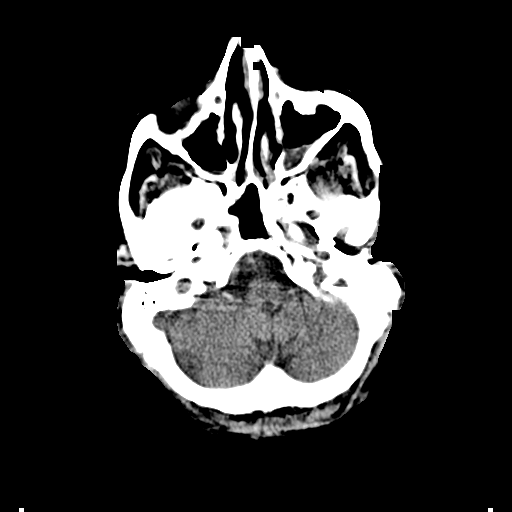
[im 7/36  brain]
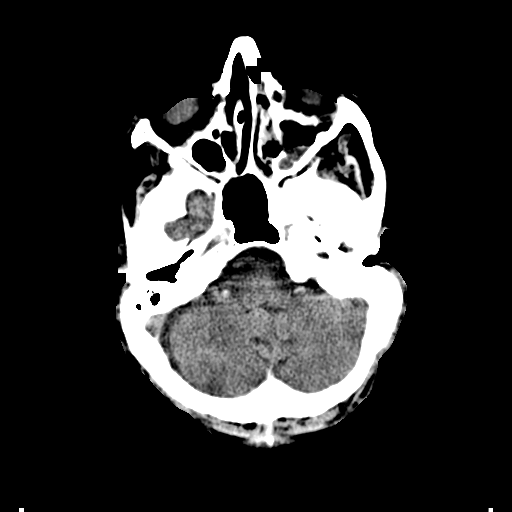
[im 9/36  brain]
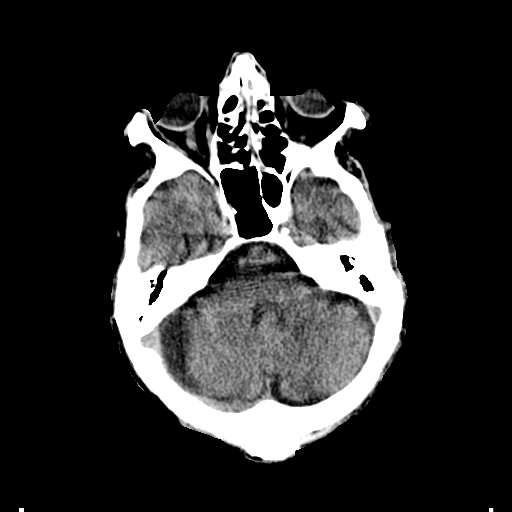
[im 12/36  brain]
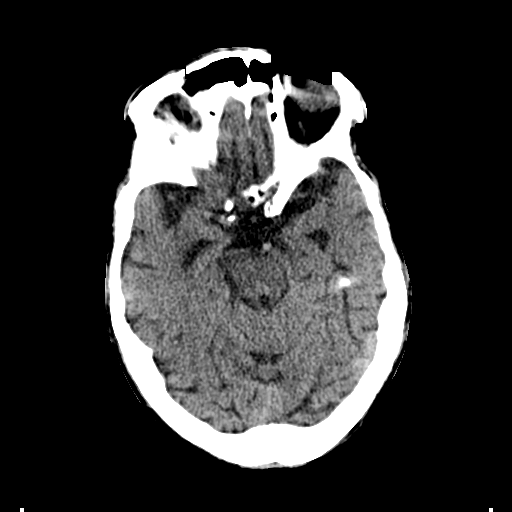
[im 12/36  bone]
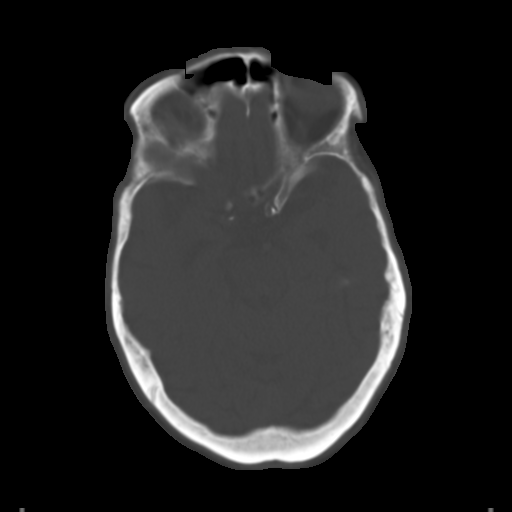
[im 14/36  brain]
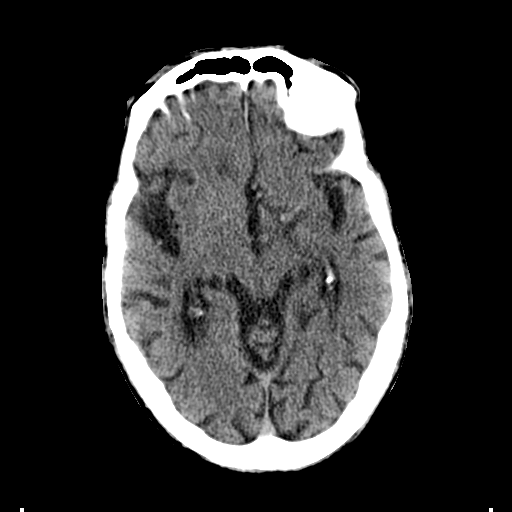
[im 16/36  brain]
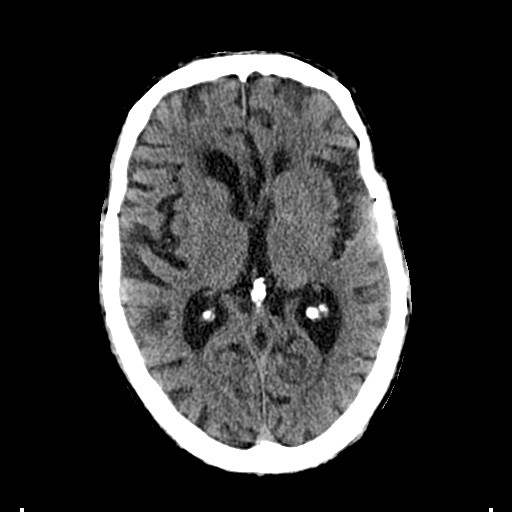
[im 19/36  brain]
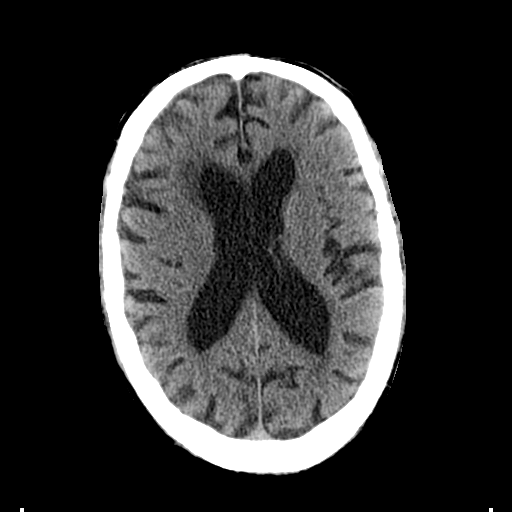
[im 21/36  brain]
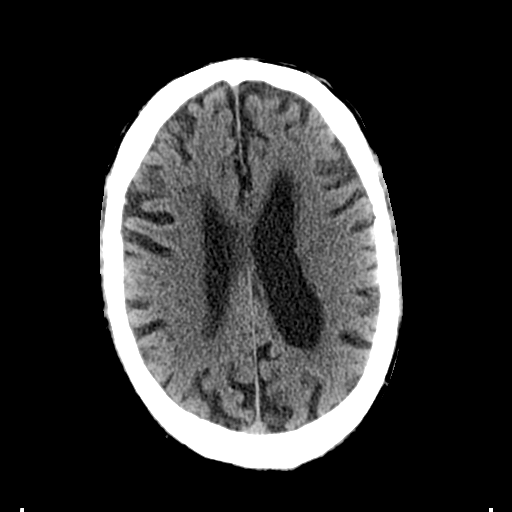
[im 21/36  bone]
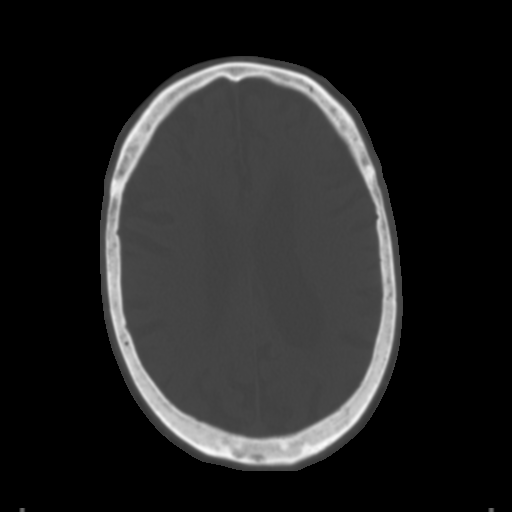
[im 22/36  brain]
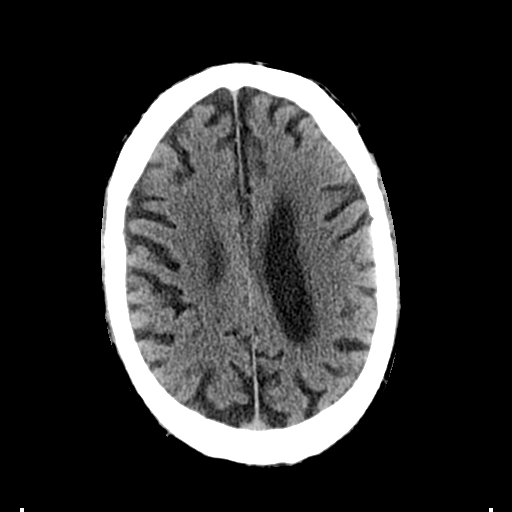
[im 26/36  brain]
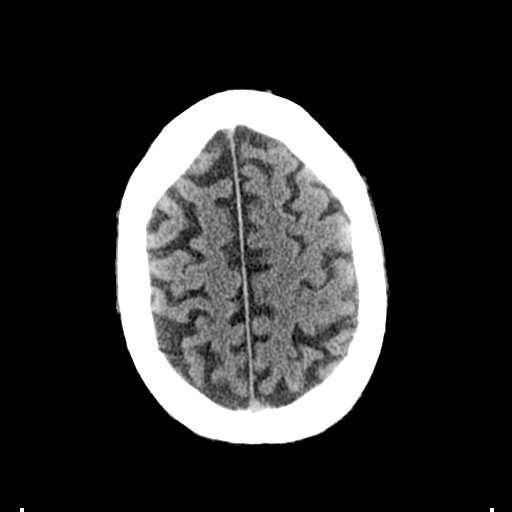
[im 27/36  brain]
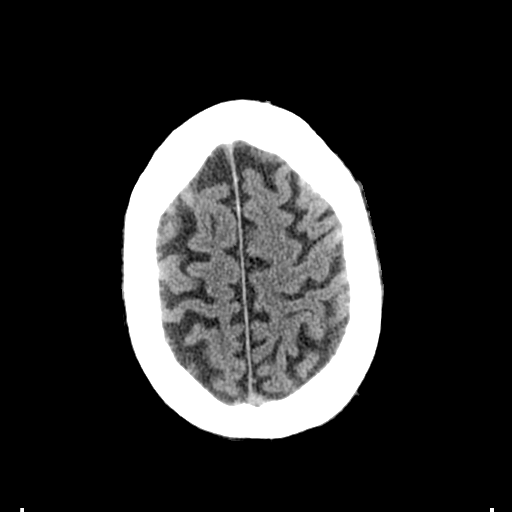
[im 29/36  brain]
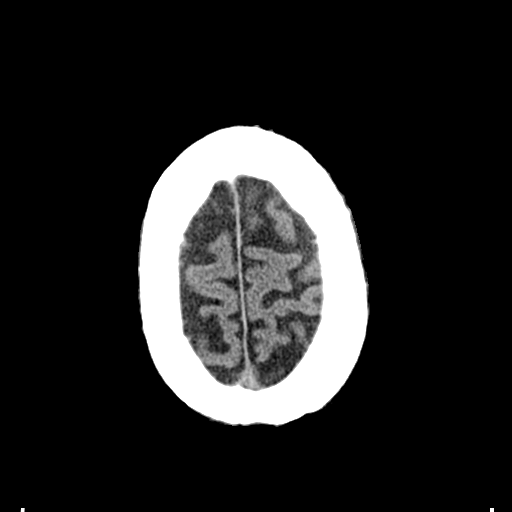
[im 29/36  bone]
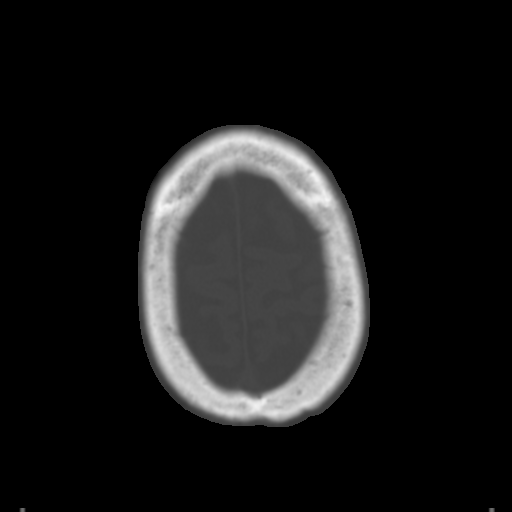
[im 32/36  brain]
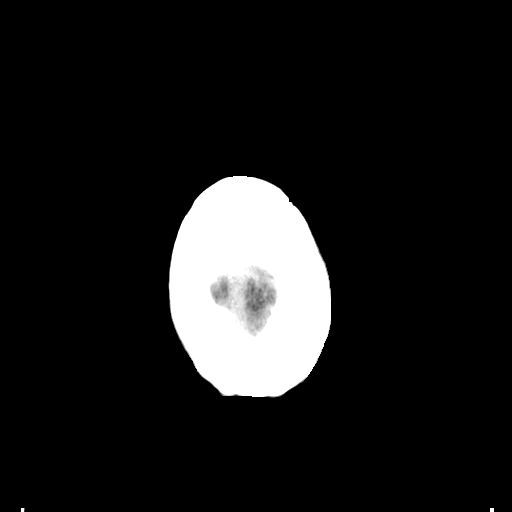
[im 34/36  brain]
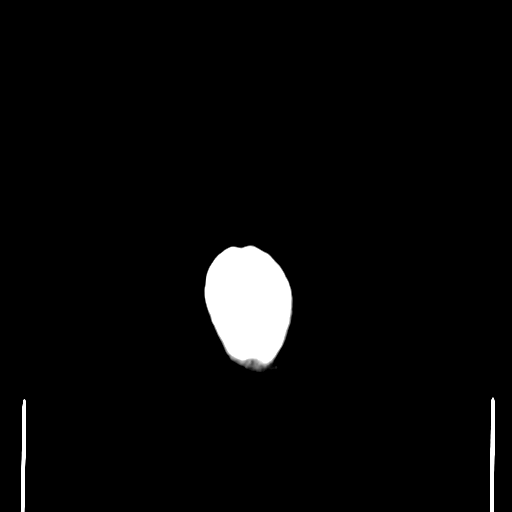

[15 of 30 positions shown; findings below may reference images not displayed]

FINDINGS: Motion artifacts at skull base on initial images, for which repeat
imaging was performed.
Generalized atrophy.
Normal ventricular morphology.
No midline shift or mass effect.
Small vessel chronic ischemic changes of deep cerebral white
matter.
No intracranial hemorrhage, mass lesion, or acute infarction.
Scattered mucosal thickening throughout the paranasal sinuses with
small air-fluid levels at the maxillary sinuses bilaterally as well
as in the left sphenoid sinus.
Atherosclerotic calcifications of internal carotid arteries at
skull base.
Bones unremarkable.
IMPRESSION: Atrophy with small vessel chronic ischemic changes of deep cerebral
white matter.
No acute intracranial abnormalities.
Sinus disease changes as above.

## 2013-02-12 ENCOUNTER — Telehealth: Payer: Self-pay | Admitting: Internal Medicine

## 2013-02-12 NOTE — Telephone Encounter (Signed)
Daughter would like someone to f/u w/ her on Monday concerning pt and his ongoing issues. Also following up on referral for health. pls call. She is stilling having all the same issues w pt.

## 2013-02-12 NOTE — Telephone Encounter (Signed)
Spoke to pt's daughter [name] asked her how I can help her? [name] said there was suppose to be a Home Health referral done and she has not heard from anyone. Told her the referral was done and I am not sure why no one has contacted her. Told her I will look into it and get back to her. Jena verbalized understanding.  [facility] that was in the chart for referral, but no one is working today, office closed.  Called back and left a message for [place] that someone will get in touch with her on Monday regarding referral.

## 2013-02-15 NOTE — Telephone Encounter (Signed)
Called [name] and told her Joyice Faster called [facility] and they said they would come out Friday to see pt. Starlyn Skeans phone number for [facility] so she can contact them. Jena verbalized understanding.

## 2013-02-16 ENCOUNTER — Other Ambulatory Visit: Payer: Self-pay | Admitting: Internal Medicine

## 2013-02-23 ENCOUNTER — Telehealth: Payer: Self-pay | Admitting: Internal Medicine

## 2013-02-23 NOTE — Telephone Encounter (Signed)
Pt was referred to [facility], Lauralyn Primes called to inform us that they finally received the referral but sadly there is no one in that area to follow patient.  Can you please refer him to another agency? Thanks

## 2013-03-08 ENCOUNTER — Telehealth: Payer: Self-pay | Admitting: Internal Medicine

## 2013-03-08 NOTE — Telephone Encounter (Signed)
Pt's daughter would like a call back, states that pt has been up since Friday, he has not slept. Please call pt back as soon as possible.

## 2013-03-09 NOTE — Telephone Encounter (Signed)
Spoke with daughter and she states " i don't care any more" and hung up the phone.

## 2013-03-19 ENCOUNTER — Ambulatory Visit: Payer: Self-pay | Admitting: Internal Medicine

## 2013-03-19 DIAGNOSIS — Z0289 Encounter for other administrative examinations: Secondary | ICD-10-CM

## 2013-03-23 DIAGNOSIS — I739 Peripheral vascular disease, unspecified: Secondary | ICD-10-CM

## 2013-03-23 DIAGNOSIS — I10 Essential (primary) hypertension: Secondary | ICD-10-CM

## 2013-03-23 DIAGNOSIS — F0390 Unspecified dementia without behavioral disturbance: Secondary | ICD-10-CM

## 2013-03-23 DIAGNOSIS — J449 Chronic obstructive pulmonary disease, unspecified: Secondary | ICD-10-CM

## 2013-03-26 ENCOUNTER — Other Ambulatory Visit: Payer: Self-pay | Admitting: Internal Medicine

## 2013-03-29 ENCOUNTER — Encounter: Payer: Self-pay | Admitting: Internal Medicine

## 2013-03-29 DIAGNOSIS — Z0289 Encounter for other administrative examinations: Secondary | ICD-10-CM

## 2013-04-23 ENCOUNTER — Ambulatory Visit: Payer: Self-pay | Admitting: Internal Medicine

## 2013-04-23 ENCOUNTER — Telehealth: Payer: Self-pay | Admitting: Internal Medicine

## 2013-04-23 ENCOUNTER — Ambulatory Visit: Payer: Self-pay | Admitting: Family Medicine

## 2013-04-23 NOTE — Telephone Encounter (Signed)
FYI, pt is on your schedule for today. 

## 2013-04-23 NOTE — Telephone Encounter (Signed)
Per scheduling pt's daughter called back and cancelled appointment due to pt not co operating. Dr. Raliegh Ip made aware.

## 2013-04-23 NOTE — Telephone Encounter (Signed)
Patient Information:  Caller Name: Herschel Senegal  Phone: [phone]  Patient: Martin, [name]  Gender: Male  DOB: 12-09-1923  Age: 80 Years  PCP: Bluford Kaufmann (Family Practice > 109yrs old)  Office Follow Up:  Does the office need to follow up with this patient?: No  Instructions For The Office: N/A  RN Note:  Onset 0430 04/23/13 and having dizziness, increased confusion.  States has dementia and has bad days; states increased confusion 04/23/13.  States "last time he was like this, went to ED and they gave him ativan and sent him home, and he became much worse."  States was told to "always call the office before bringing to ED."  Denies emergent symptoms per confusion protocol; advised appt within 24 hours due to "longstanding confusion and worsening."  krs/can  Symptoms  Reason For Call & Symptoms: lightheaded and dizzy  Reviewed Health History In EMR: Yes  Reviewed Medications In EMR: Yes  Reviewed Allergies In EMR: Yes  Reviewed Surgeries / Procedures: Yes  Date of Onset of Symptoms: 04/21/2013  Guideline(s) Used:  Diarrhea  Confusion - Delirium  Disposition Per Guideline:   See Today or Tomorrow in Office  Reason For Disposition Reached:   Longstanding confusion (e.g., dementia, stroke) and worsening  Advice Given:  N/A  Patient Will Follow Care Advice:  YES  Appointment Scheduled:  04/23/2013 16:30:00 Appointment Scheduled Provider:  Carolann Littler (Family Practice)

## 2013-05-25 NOTE — Telephone Encounter (Signed)
Is this done?

## 2013-05-31 ENCOUNTER — Encounter: Payer: Self-pay | Admitting: Family Medicine

## 2013-05-31 NOTE — Progress Notes (Signed)
error    This encounter was created in error - please disregard.

## 2013-07-31 ENCOUNTER — Encounter (HOSPITAL_COMMUNITY): Payer: Self-pay | Admitting: Emergency Medicine

## 2013-07-31 ENCOUNTER — Emergency Department (HOSPITAL_COMMUNITY): Payer: Medicare Other

## 2013-07-31 ENCOUNTER — Emergency Department (HOSPITAL_COMMUNITY)
Admission: EM | Admit: 2013-07-31 | Discharge: 2013-08-01 | Disposition: A | Discharge status: Home / Self Care | Payer: Medicare Other | Attending: Emergency Medicine | Admitting: Emergency Medicine

## 2013-07-31 DIAGNOSIS — Z8669 Personal history of other diseases of the nervous system and sense organs: Secondary | ICD-10-CM | POA: Insufficient documentation

## 2013-07-31 DIAGNOSIS — F0390 Unspecified dementia without behavioral disturbance: Secondary | ICD-10-CM | POA: Insufficient documentation

## 2013-07-31 DIAGNOSIS — Z7982 Long term (current) use of aspirin: Secondary | ICD-10-CM | POA: Insufficient documentation

## 2013-07-31 DIAGNOSIS — Z79899 Other long term (current) drug therapy: Secondary | ICD-10-CM | POA: Insufficient documentation

## 2013-07-31 DIAGNOSIS — I4891 Unspecified atrial fibrillation: Secondary | ICD-10-CM | POA: Insufficient documentation

## 2013-07-31 DIAGNOSIS — J189 Pneumonia, unspecified organism: Secondary | ICD-10-CM

## 2013-07-31 DIAGNOSIS — J159 Unspecified bacterial pneumonia: Secondary | ICD-10-CM | POA: Insufficient documentation

## 2013-07-31 DIAGNOSIS — I4892 Unspecified atrial flutter: Secondary | ICD-10-CM | POA: Insufficient documentation

## 2013-07-31 DIAGNOSIS — R079 Chest pain, unspecified: Secondary | ICD-10-CM | POA: Insufficient documentation

## 2013-07-31 DIAGNOSIS — Z8546 Personal history of malignant neoplasm of prostate: Secondary | ICD-10-CM | POA: Insufficient documentation

## 2013-07-31 DIAGNOSIS — M1990 Unspecified osteoarthritis, unspecified site: Secondary | ICD-10-CM | POA: Insufficient documentation

## 2013-07-31 DIAGNOSIS — E7800 Pure hypercholesterolemia, unspecified: Secondary | ICD-10-CM | POA: Insufficient documentation

## 2013-07-31 DIAGNOSIS — Z8781 Personal history of (healed) traumatic fracture: Secondary | ICD-10-CM | POA: Insufficient documentation

## 2013-07-31 DIAGNOSIS — R0602 Shortness of breath: Secondary | ICD-10-CM

## 2013-07-31 DIAGNOSIS — Z87891 Personal history of nicotine dependence: Secondary | ICD-10-CM | POA: Insufficient documentation

## 2013-07-31 DIAGNOSIS — J441 Chronic obstructive pulmonary disease with (acute) exacerbation: Secondary | ICD-10-CM | POA: Insufficient documentation

## 2013-07-31 DIAGNOSIS — I10 Essential (primary) hypertension: Secondary | ICD-10-CM | POA: Insufficient documentation

## 2013-07-31 LAB — BASIC METABOLIC PANEL
BUN: 12 mg/dL (ref 6–23)
CO2: 27 mEq/L (ref 19–32)
CREATININE: 0.83 mg/dL (ref 0.50–1.35)
Calcium: 9.7 mg/dL (ref 8.4–10.5)
Chloride: 99 mEq/L (ref 96–112)
GFR calc Af Amer: 89 mL/min — ABNORMAL LOW (ref >90)
GFR, EST NON AFRICAN AMERICAN: 77 mL/min — AB (ref >90)
GLUCOSE: 112 mg/dL — AB (ref 70–99)
POTASSIUM: 4.1 meq/L (ref 3.7–5.3)
Sodium: 140 mEq/L (ref 137–147)

## 2013-07-31 LAB — URINE MICROSCOPIC-ADD ON

## 2013-07-31 LAB — URINALYSIS, ROUTINE W REFLEX MICROSCOPIC
Bilirubin Urine: NEGATIVE
GLUCOSE, UA: NEGATIVE mg/dL
Hgb urine dipstick: NEGATIVE
Ketones, ur: NEGATIVE mg/dL
LEUKOCYTES UA: NEGATIVE
NITRITE: NEGATIVE
Protein, ur: 30 mg/dL — AB
SPECIFIC GRAVITY, URINE: 1.021 (ref 1.005–1.030)
Urobilinogen, UA: 1 mg/dL (ref 0.0–1.0)
pH: 6 (ref 5.0–8.0)

## 2013-07-31 LAB — CBC
HEMATOCRIT: 42.4 % (ref 39.0–52.0)
HEMOGLOBIN: 14.2 g/dL (ref 13.0–17.0)
MCH: 31.4 pg (ref 26.0–34.0)
MCHC: 33.5 g/dL (ref 30.0–36.0)
MCV: 93.8 fL (ref 78.0–100.0)
Platelets: 222 10*3/uL (ref 150–400)
RBC: 4.52 MIL/uL (ref 4.22–5.81)
RDW: 14.5 % (ref 11.5–15.5)
WBC: 9.6 10*3/uL (ref 4.0–10.5)

## 2013-07-31 LAB — I-STAT TROPONIN, ED
Troponin i, poc: 0 ng/mL (ref 0.00–0.08)
Troponin i, poc: 0.01 ng/mL (ref 0.00–0.08)

## 2013-07-31 LAB — I-STAT CG4 LACTIC ACID, ED: Lactic Acid, Venous: 0.75 mmol/L (ref 0.5–2.2)

## 2013-07-31 LAB — PRO B NATRIURETIC PEPTIDE: PRO B NATRI PEPTIDE: 1679 pg/mL — AB (ref 0–450)

## 2013-07-31 NOTE — ED Provider Notes (Signed)
CSN: [id number]     Arrival date & time 07/31/13  1828 History   First MD Initiated Contact with Patient 07/31/13 1912     Chief Complaint  Patient presents with  . Chest Pain      Patient is a 80 y.o. male with PMH as below and relevant for atrial fibirllation, HLD, and COPD who presents with complaints of intermittent CP and increased work of breathing.  Patient has dementia and thus history is somewhat limited.  He has no complaints but does say he was feeling SOB earlier today and felt like "rock" was in chest.  Daughter reports that patient has had increased WOB today, oral temp of 103, and has had one month h/o 3-4 bouts of NB diarrhea per day.  She also reports that for the past 3 days patient has had worsening non productive cough.        (Consider location/radiation/quality/duration/timing/severity/associated sxs/prior Treatment) Patient is a 80 y.o. male presenting with general illness. The history is provided by the patient and a relative.  Illness Severity:  Moderate Onset quality:  Gradual Timing:  Constant Progression:  Worsening Chronicity:  New   Past Medical History  Diagnosis Date  . Atrial fibrillation   . AAA (abdominal aortic aneurysm)   . Carotid stenosis   . Hypercholesteremia   . Cardiomyopathy     improved  . Renal artery stenosis   . COPD (chronic obstructive pulmonary disease)   . Osteoarthritis   . Prostate cancer   . Atrial flutter     s/p ablation  . Hip fracture   . DEMENTIA   . Hypertension   . Macular degeneration disease    Past Surgical History  Procedure Laterality Date  . Appendectomy    . Cholecystectomy    . Tonsillectomy     Family History  Problem Relation Age of Onset  . Heart attack Father    History  Substance Use Topics  . Smoking status: Former Smoker -- 1.00 packs/day for 15 years    Types: Cigarettes    Quit date: 01/21/2001  . Smokeless tobacco: Former Systems developer  . Alcohol Use: No    Review of Systems  All  other systems reviewed and are negative.     Allergies  Review of patient's allergies indicates no known allergies.  Home Medications   Prior to Admission medications   Medication Sig Start Date End Date Taking? Authorizing Provider  aspirin EC 81 MG tablet Take 81 mg by mouth every evening.    Yes Historical Provider, MD  diltiazem (CARDIZEM CD) 180 MG 24 hr capsule Take 180 mg by mouth daily.   Yes Historical Provider, MD  metoprolol (LOPRESSOR) 50 MG tablet Take 50 mg by mouth 2 (two) times daily.   Yes Historical Provider, MD  potassium chloride SA (K-DUR,KLOR-CON) 20 MEQ tablet Take 20 mEq by mouth 2 (two) times daily.   Yes Historical Provider, MD  pravastatin (PRAVACHOL) 40 MG tablet Take 40 mg by mouth at bedtime.    Yes Historical Provider, MD   BP 104/67  Pulse 115  Temp(Src) 98.2 F (36.8 C) (Oral)  Resp 17  Ht 6\' 2"  (1.88 m)  Wt 142 lb (64.411 kg)  BMI 18.22 kg/m2  SpO2 94% Physical Exam  Nursing note and vitals reviewed. Constitutional: He appears well-developed and well-nourished.  HENT:  Head: Normocephalic and atraumatic.  Right Ear: External ear normal.  Left Ear: External ear normal.  Nose: Nose normal.  Eyes: Conjunctivae are normal. Pupils  are equal, round, and reactive to light.  Neck: Normal range of motion. Neck supple.  Cardiovascular: Normal rate and intact distal pulses.  An irregularly irregular rhythm present.  Pulmonary/Chest: Effort normal and breath sounds normal.  Abdominal: Soft. Bowel sounds are normal.  Musculoskeletal: Normal range of motion. He exhibits no edema and no tenderness.  Neurological: He is alert.  Oriented only to person; baseline per daughter  Skin: Skin is warm and dry.  Psychiatric: He has a normal mood and affect.    ED Course  Procedures (including critical care time) Labs Review Labs Reviewed  BASIC METABOLIC PANEL - Abnormal; Notable for the following:    Glucose, Bld 112 (*)    GFR calc non Af Amer 77 (*)     GFR calc Af Amer 89 (*)    All other components within normal limits  PRO B NATRIURETIC PEPTIDE - Abnormal; Notable for the following:    Pro B Natriuretic peptide (BNP) 1679.0 (*)    All other components within normal limits  URINALYSIS, ROUTINE W REFLEX MICROSCOPIC - Abnormal; Notable for the following:    Color, Urine AMBER (*)    APPearance CLOUDY (*)    Protein, ur 30 (*)    All other components within normal limits  URINE MICROSCOPIC-ADD ON - Abnormal; Notable for the following:    Squamous Epithelial / LPF FEW (*)    Bacteria, UA FEW (*)    All other components within normal limits  CBC  I-STAT TROPOININ, ED  I-STAT CG4 LACTIC ACID, ED  Randolm Idol, ED    Imaging Review Dg Chest 2 View  07/31/2013   CLINICAL DATA:  Cough and chest pain  EXAM: CHEST  2 VIEW  COMPARISON:  01/21/2011  FINDINGS: Evidence of granulomatous disease reidentified. Heart size upper limits of normal. Clips project over the right upper quadrant. No pleural effusion. No acute osseous abnormality. Flowing osteophytosis is noted throughout the thoracic spine. Left upper lobe chain sutures noted.  IMPRESSION: No acute cardiopulmonary process. Evidence of granulomas disease reidentified.   Electronically Signed   By: Conchita Paris M.D.   On: 07/31/2013 19:22     EKG Interpretation None      Date: 07/31/2013  Rate: 91  Rhythm: atrial fibrillation  QRS Axis: normal  Intervals: normal and other than no PR  ST/T Wave abnormalities: normal  Conduction Disutrbances:none  Narrative Interpretation:   Old EKG Reviewed: unchanged   MDM   Final diagnoses:  Chest pain  SOB (shortness of breath)  Community acquired pneumonia    Patient presents with complaints of fever, cough, CP, and SOB.  VS here remarkable for no fever, no tachycardia (inital HR of 115 in triage but without intervention and during ED stay HR less than 100), no hypoxia, and no hypo/hypertension.  PE as above and remarkable for no  signs of volume overload, baseline MS, no neuro deficits, and otherwise as above.  EKG showed afib with no signs of ischemia.  Labs remarkable for no leukocytosis, normal lactate, and troponin wnl.  BNP elevated but with no signs of volume overload on exam and no signs of pulmonary edema on CXR it was not felt that CHF exacerbation was cause of patient's symptoms.  CXR, on my read, shows obscuring of left heat border concerning for PNA.  Delta troponin completed and negative.  Patient was reassessed and he remained HDS, and without complaints.  Likely etiology of patient's symptoms attributed to PNA and as such will discharge on  levaquin.  Patient was given strict return precautions including return of CP, vomiting, fevers, or with changes in MS.      Corlis Leak, MD 08/01/13 0030

## 2013-07-31 NOTE — ED Notes (Signed)
Pt. In xray 

## 2013-07-31 NOTE — ED Notes (Signed)
His daughter states he has alzheimer's and over the past week he has not been acting like his normal self. She says he has not eaten well for past 2 days. She states today he "seemed like he couldn't catch his breathe and he said it felt like there was a rock in his chest." she said he had a fever earlier today. She gave him ASA for the fever. He is alert and breathing easily in triage.

## 2013-07-31 NOTE — ED Notes (Signed)
Dr. Wofford at bedside 

## 2013-08-01 ENCOUNTER — Emergency Department (HOSPITAL_COMMUNITY): Payer: Medicare Other

## 2013-08-01 ENCOUNTER — Inpatient Hospital Stay (HOSPITAL_COMMUNITY): Payer: Medicare Other

## 2013-08-01 ENCOUNTER — Encounter (HOSPITAL_COMMUNITY): Payer: Self-pay | Admitting: Emergency Medicine

## 2013-08-01 ENCOUNTER — Inpatient Hospital Stay (HOSPITAL_COMMUNITY)
Admission: EM | Admit: 2013-08-01 | Discharge: 2013-08-13 | DRG: 480 | Disposition: A | Discharge status: Skilled Nursing Facility | Payer: Medicare Other | Attending: Internal Medicine | Admitting: Internal Medicine

## 2013-08-01 DIAGNOSIS — I959 Hypotension, unspecified: Secondary | ICD-10-CM

## 2013-08-01 DIAGNOSIS — I10 Essential (primary) hypertension: Secondary | ICD-10-CM

## 2013-08-01 DIAGNOSIS — R509 Fever, unspecified: Secondary | ICD-10-CM

## 2013-08-01 DIAGNOSIS — F0390 Unspecified dementia without behavioral disturbance: Secondary | ICD-10-CM

## 2013-08-01 DIAGNOSIS — I428 Other cardiomyopathies: Secondary | ICD-10-CM

## 2013-08-01 DIAGNOSIS — J4489 Other specified chronic obstructive pulmonary disease: Secondary | ICD-10-CM

## 2013-08-01 DIAGNOSIS — I2510 Atherosclerotic heart disease of native coronary artery without angina pectoris: Secondary | ICD-10-CM | POA: Diagnosis present

## 2013-08-01 DIAGNOSIS — I6529 Occlusion and stenosis of unspecified carotid artery: Secondary | ICD-10-CM

## 2013-08-01 DIAGNOSIS — M1990 Unspecified osteoarthritis, unspecified site: Secondary | ICD-10-CM

## 2013-08-01 DIAGNOSIS — J9600 Acute respiratory failure, unspecified whether with hypoxia or hypercapnia: Secondary | ICD-10-CM | POA: Diagnosis present

## 2013-08-01 DIAGNOSIS — M25559 Pain in unspecified hip: Secondary | ICD-10-CM

## 2013-08-01 DIAGNOSIS — I714 Abdominal aortic aneurysm, without rupture, unspecified: Secondary | ICD-10-CM

## 2013-08-01 DIAGNOSIS — S72001A Fracture of unspecified part of neck of right femur, initial encounter for closed fracture: Secondary | ICD-10-CM

## 2013-08-01 DIAGNOSIS — S72009A Fracture of unspecified part of neck of unspecified femur, initial encounter for closed fracture: Secondary | ICD-10-CM

## 2013-08-01 DIAGNOSIS — Z681 Body mass index (BMI) 19 or less, adult: Secondary | ICD-10-CM

## 2013-08-01 DIAGNOSIS — R197 Diarrhea, unspecified: Secondary | ICD-10-CM

## 2013-08-01 DIAGNOSIS — IMO0002 Reserved for concepts with insufficient information to code with codable children: Secondary | ICD-10-CM | POA: Diagnosis present

## 2013-08-01 DIAGNOSIS — Z79899 Other long term (current) drug therapy: Secondary | ICD-10-CM

## 2013-08-01 DIAGNOSIS — I4891 Unspecified atrial fibrillation: Secondary | ICD-10-CM

## 2013-08-01 DIAGNOSIS — T17308A Unspecified foreign body in larynx causing other injury, initial encounter: Secondary | ICD-10-CM | POA: Diagnosis present

## 2013-08-01 DIAGNOSIS — J9601 Acute respiratory failure with hypoxia: Secondary | ICD-10-CM

## 2013-08-01 DIAGNOSIS — H6120 Impacted cerumen, unspecified ear: Secondary | ICD-10-CM

## 2013-08-01 DIAGNOSIS — M545 Low back pain, unspecified: Secondary | ICD-10-CM

## 2013-08-01 DIAGNOSIS — J9811 Atelectasis: Secondary | ICD-10-CM

## 2013-08-01 DIAGNOSIS — Z66 Do not resuscitate: Secondary | ICD-10-CM | POA: Diagnosis present

## 2013-08-01 DIAGNOSIS — Y92009 Unspecified place in unspecified non-institutional (private) residence as the place of occurrence of the external cause: Secondary | ICD-10-CM

## 2013-08-01 DIAGNOSIS — E43 Unspecified severe protein-calorie malnutrition: Secondary | ICD-10-CM

## 2013-08-01 DIAGNOSIS — R7309 Other abnormal glucose: Secondary | ICD-10-CM | POA: Diagnosis present

## 2013-08-01 DIAGNOSIS — Z7901 Long term (current) use of anticoagulants: Secondary | ICD-10-CM

## 2013-08-01 DIAGNOSIS — K567 Ileus, unspecified: Secondary | ICD-10-CM

## 2013-08-01 DIAGNOSIS — G9341 Metabolic encephalopathy: Secondary | ICD-10-CM

## 2013-08-01 DIAGNOSIS — W06XXXA Fall from bed, initial encounter: Secondary | ICD-10-CM | POA: Diagnosis present

## 2013-08-01 DIAGNOSIS — T17908A Unspecified foreign body in respiratory tract, part unspecified causing other injury, initial encounter: Secondary | ICD-10-CM

## 2013-08-01 DIAGNOSIS — R5381 Other malaise: Secondary | ICD-10-CM

## 2013-08-01 DIAGNOSIS — S72143A Displaced intertrochanteric fracture of unspecified femur, initial encounter for closed fracture: Principal | ICD-10-CM | POA: Diagnosis present

## 2013-08-01 DIAGNOSIS — E739 Lactose intolerance, unspecified: Secondary | ICD-10-CM

## 2013-08-01 DIAGNOSIS — I739 Peripheral vascular disease, unspecified: Secondary | ICD-10-CM

## 2013-08-01 DIAGNOSIS — K560 Paralytic ileus: Secondary | ICD-10-CM

## 2013-08-01 DIAGNOSIS — R1310 Dysphagia, unspecified: Secondary | ICD-10-CM

## 2013-08-01 DIAGNOSIS — E876 Hypokalemia: Secondary | ICD-10-CM | POA: Diagnosis not present

## 2013-08-01 DIAGNOSIS — Z8673 Personal history of transient ischemic attack (TIA), and cerebral infarction without residual deficits: Secondary | ICD-10-CM

## 2013-08-01 DIAGNOSIS — N390 Urinary tract infection, site not specified: Secondary | ICD-10-CM

## 2013-08-01 DIAGNOSIS — I701 Atherosclerosis of renal artery: Secondary | ICD-10-CM

## 2013-08-01 DIAGNOSIS — H3530 Unspecified macular degeneration: Secondary | ICD-10-CM | POA: Diagnosis present

## 2013-08-01 DIAGNOSIS — J449 Chronic obstructive pulmonary disease, unspecified: Secondary | ICD-10-CM

## 2013-08-01 DIAGNOSIS — Z8546 Personal history of malignant neoplasm of prostate: Secondary | ICD-10-CM

## 2013-08-01 DIAGNOSIS — Z7982 Long term (current) use of aspirin: Secondary | ICD-10-CM

## 2013-08-01 DIAGNOSIS — S32511A Fracture of superior rim of right pubis, initial encounter for closed fracture: Secondary | ICD-10-CM

## 2013-08-01 DIAGNOSIS — H6500 Acute serous otitis media, unspecified ear: Secondary | ICD-10-CM

## 2013-08-01 DIAGNOSIS — Z87891 Personal history of nicotine dependence: Secondary | ICD-10-CM

## 2013-08-01 DIAGNOSIS — E7800 Pure hypercholesterolemia, unspecified: Secondary | ICD-10-CM

## 2013-08-01 DIAGNOSIS — J690 Pneumonitis due to inhalation of food and vomit: Secondary | ICD-10-CM | POA: Diagnosis present

## 2013-08-01 LAB — CBC WITH DIFFERENTIAL/PLATELET
BASOS ABS: 0 10*3/uL (ref 0.0–0.1)
BASOS PCT: 0 % (ref 0–1)
EOS ABS: 0.1 10*3/uL (ref 0.0–0.7)
EOS PCT: 1 % (ref 0–5)
HCT: 39.9 % (ref 39.0–52.0)
Hemoglobin: 13.2 g/dL (ref 13.0–17.0)
Lymphocytes Relative: 11 % — ABNORMAL LOW (ref 12–46)
Lymphs Abs: 1.3 10*3/uL (ref 0.7–4.0)
MCH: 31.1 pg (ref 26.0–34.0)
MCHC: 33.1 g/dL (ref 30.0–36.0)
MCV: 94.1 fL (ref 78.0–100.0)
Monocytes Absolute: 1.1 10*3/uL — ABNORMAL HIGH (ref 0.1–1.0)
Monocytes Relative: 10 % (ref 3–12)
NEUTROS PCT: 78 % — AB (ref 43–77)
Neutro Abs: 8.9 10*3/uL — ABNORMAL HIGH (ref 1.7–7.7)
PLATELETS: 215 10*3/uL (ref 150–400)
RBC: 4.24 MIL/uL (ref 4.22–5.81)
RDW: 14.5 % (ref 11.5–15.5)
WBC: 11.3 10*3/uL — ABNORMAL HIGH (ref 4.0–10.5)

## 2013-08-01 LAB — TROPONIN I

## 2013-08-01 LAB — COMPREHENSIVE METABOLIC PANEL
ALBUMIN: 3.7 g/dL (ref 3.5–5.2)
ALT: 22 U/L (ref 0–53)
AST: 27 U/L (ref 0–37)
Alkaline Phosphatase: 132 U/L — ABNORMAL HIGH (ref 39–117)
BUN: 16 mg/dL (ref 6–23)
CALCIUM: 9.2 mg/dL (ref 8.4–10.5)
CO2: 25 mEq/L (ref 19–32)
Chloride: 99 mEq/L (ref 96–112)
Creatinine, Ser: 1.02 mg/dL (ref 0.50–1.35)
GFR calc non Af Amer: 64 mL/min — ABNORMAL LOW (ref >90)
GFR, EST AFRICAN AMERICAN: 74 mL/min — AB (ref >90)
Glucose, Bld: 122 mg/dL — ABNORMAL HIGH (ref 70–99)
POTASSIUM: 4 meq/L (ref 3.7–5.3)
SODIUM: 137 meq/L (ref 137–147)
Total Bilirubin: 0.7 mg/dL (ref 0.3–1.2)
Total Protein: 7.1 g/dL (ref 6.0–8.3)

## 2013-08-01 LAB — PROTIME-INR
INR: 1.04 (ref 0.00–1.49)
Prothrombin Time: 13.4 seconds (ref 11.6–15.2)

## 2013-08-01 MED ORDER — IOHEXOL 300 MG/ML  SOLN
100.0000 mL | Freq: Once | INTRAMUSCULAR | Status: AC | PRN
  Administered 2013-08-01: 100 mL via INTRAVENOUS

## 2013-08-01 MED ORDER — IOHEXOL 350 MG/ML SOLN
80.0000 mL | Freq: Once | INTRAVENOUS | Status: AC | PRN
  Administered 2013-08-01: 80 mL via INTRAVENOUS

## 2013-08-01 MED ORDER — FENTANYL CITRATE 0.05 MG/ML IJ SOLN
50.0000 ug | Freq: Once | INTRAMUSCULAR | Status: AC
Start: 2013-08-01 — End: 2013-08-01
  Administered 2013-08-01: 50 ug via INTRAVENOUS
  Filled 2013-08-01: qty 2

## 2013-08-01 MED ORDER — LEVOFLOXACIN 750 MG PO TABS: 750.0000 mg | ORAL_TABLET | Freq: Every day | ORAL | Status: DC

## 2013-08-01 MED ORDER — ALBUTEROL SULFATE (2.5 MG/3ML) 0.083% IN NEBU
2.5000 mg | INHALATION_SOLUTION | RESPIRATORY_TRACT | Status: DC | PRN
  Administered 2013-08-01: 2.5 mg via RESPIRATORY_TRACT
  Filled 2013-08-01: qty 3

## 2013-08-01 NOTE — ED Notes (Signed)
Per EMS patient from home d/ced from ED with pnemonia yesterday. Patient found by family on floor patient std he had rolled off bed. Patient c/o right hip pain, no obvious deformity- some shortening noted.

## 2013-08-01 NOTE — ED Provider Notes (Signed)
CSN: [id number]     Arrival date & time 08/01/13  1521 History   First MD Initiated Contact with Patient 08/01/13 1527     Chief Complaint  Patient presents with  . Hip Pain     (Consider location/radiation/quality/duration/timing/severity/associated sxs/prior Treatment) HPI Comments: Patient presents from home with fall around 2 AM on the floor by family with right hip pain. He was seen in the ED yesterday and diagnosed with pneumonia and started on Levaquin. Complaining of shortness of breath and chest yesterday but none today. He denies hitting his head or losing consciousness. History is limited due to dementia. He is not on anticoagulants. He denies any head, neck, back or chest pain today. No abdominal pain.  The history is provided by the patient and the EMS personnel. The history is limited by the condition of the patient.    Past Medical History  Diagnosis Date  . Atrial fibrillation   . AAA (abdominal aortic aneurysm)   . Carotid stenosis   . Hypercholesteremia   . Cardiomyopathy     improved  . Renal artery stenosis   . COPD (chronic obstructive pulmonary disease)   . Osteoarthritis   . Atrial flutter     s/p ablation  . Hip fracture   . DEMENTIA   . Hypertension   . Macular degeneration disease   . Prostate cancer    Past Surgical History  Procedure Laterality Date  . Appendectomy    . Cholecystectomy    . Tonsillectomy     Family History  Problem Relation Age of Onset  . Heart attack Father    History  Substance Use Topics  . Smoking status: Former Smoker -- 1.00 packs/day for 15 years    Types: Cigarettes    Quit date: 01/21/2001  . Smokeless tobacco: Former Systems developer  . Alcohol Use: No    Review of Systems  Unable to perform ROS: Dementia      Allergies  Review of patient's allergies indicates no known allergies.  Home Medications   Prior to Admission medications   Medication Sig Start Date End Date Taking? Authorizing Provider  aspirin EC  81 MG tablet Take 81 mg by mouth every evening.    Yes Historical Provider, MD  diltiazem (CARDIZEM CD) 180 MG 24 hr capsule Take 180 mg by mouth daily.   Yes Historical Provider, MD  metoprolol (LOPRESSOR) 50 MG tablet Take 50 mg by mouth 2 (two) times daily.   Yes Historical Provider, MD  potassium chloride SA (K-DUR,KLOR-CON) 20 MEQ tablet Take 20 mEq by mouth 2 (two) times daily.   Yes Historical Provider, MD  pravastatin (PRAVACHOL) 40 MG tablet Take 40 mg by mouth at bedtime.    Yes Historical Provider, MD  levofloxacin (LEVAQUIN) 750 MG tablet Take 1 tablet (750 mg total) by mouth daily. X 7 days 08/01/13   Corlis Leak, MD   BP 113/46  Pulse 58  Temp(Src) 97.7 F (36.5 C) (Oral)  Resp 26  Ht 6\' 2"  (1.88 m)  Wt 142 lb (64.411 kg)  BMI 18.22 kg/m2  SpO2 95% Physical Exam  Constitutional: He appears well-developed and well-nourished. No distress.  HENT:  Head: Normocephalic and atraumatic.  Mouth/Throat: Oropharynx is clear and moist. No oropharyngeal exudate.  Eyes: Conjunctivae and EOM are normal. Pupils are equal, round, and reactive to light.  Neck: Normal range of motion. Neck supple.  No C spine pain  Cardiovascular: Normal rate and normal heart sounds.   irregular rhythm  Pulmonary/Chest:  Effort normal and breath sounds normal. No respiratory distress.  Abdominal: Soft. There is no tenderness. There is no rebound and no guarding.  Musculoskeletal: Normal range of motion. He exhibits tenderness.  Right leg is shortened and externally rotated. Intact DP and PT pulses No T. or L-spine tenderness  Neurological: He is alert. No cranial nerve deficit. He exhibits normal muscle tone. Coordination normal.  Skin: Skin is warm.    ED Course  Procedures (including critical care time) Labs Review Labs Reviewed  CBC WITH DIFFERENTIAL - Abnormal; Notable for the following:    WBC 11.3 (*)    Neutrophils Relative % 78 (*)    Neutro Abs 8.9 (*)    Lymphocytes Relative 11 (*)     Monocytes Absolute 1.1 (*)    All other components within normal limits  COMPREHENSIVE METABOLIC PANEL - Abnormal; Notable for the following:    Glucose, Bld 122 (*)    Alkaline Phosphatase 132 (*)    GFR calc non Af Amer 64 (*)    GFR calc Af Amer 74 (*)    All other components within normal limits  PROTIME-INR  TROPONIN I    Imaging Review Dg Chest 1 View  08/01/2013   CLINICAL DATA:  Fall.  Right hip pain.  EXAM: CHEST - 1 VIEW  COMPARISON:  DG CHEST 2 VIEW dated 07/31/2013  FINDINGS: Wedge resection clips in the left lung.  Thoracic spondylosis. Atherosclerotic calcification of the aortic arch. Old granulomatous disease bilaterally with calcified pulmonary granulomas.  Mild cardiomegaly. Indistinct vasculature with cephalization of blood flow favoring pulmonary venous hypertension.  Degenerative right glenohumeral arthropathy.  IMPRESSION: 1. Cardiomegaly with cephalization of blood flow suggesting pulmonary venous hypertension. 2. Old granulomatous disease. 3. Bony demineralization. 4. Atherosclerosis.   Electronically Signed   By: Sherryl Barters M.D.   On: 08/01/2013 17:11   Dg Chest 2 View  07/31/2013   CLINICAL DATA:  Cough and chest pain  EXAM: CHEST  2 VIEW  COMPARISON:  01/21/2011  FINDINGS: Evidence of granulomatous disease reidentified. Heart size upper limits of normal. Clips project over the right upper quadrant. No pleural effusion. No acute osseous abnormality. Flowing osteophytosis is noted throughout the thoracic spine. Left upper lobe chain sutures noted.  IMPRESSION: No acute cardiopulmonary process. Evidence of granulomas disease reidentified.   Electronically Signed   By: Conchita Paris M.D.   On: 07/31/2013 19:22   Dg Hip Complete Right  08/01/2013   CLINICAL DATA:  Fall.  Right hip pain.  EXAM: RIGHT HIP - COMPLETE 2+ VIEW  COMPARISON:  CT HIP*R* W/O CM dated 04/27/2010  FINDINGS: Left dynamic hip screw.  Prostate seed implants noted.  Prominent bony demineralization.  Acute right hip intertrochanteric fracture is present. There is a separate lesser trochanteric fragment.  Old deformity from prior right pubic ramus fractures noted.  Mildly dilated loops of bowel in the pelvis including a 4.2 cm right pelvic small bowel loop, possibly posttraumatic ileus.  IMPRESSION: 1. Acute right intertrochanteric fracture, with separate lesser trochanteric fragment. 2. Old deformity in the right pubic bones from old fractures. 3. Prominent bony demineralization. 4. Mildly dilated small bowel in the pelvis, query posttraumatic ileus.   Electronically Signed   By: Sherryl Barters M.D.   On: 08/01/2013 17:09   Ct Cervical Spine Wo Contrast  08/01/2013   CLINICAL DATA:  Found on floor.  EXAM: CT HEAD WITHOUT CONTRAST  CT CERVICAL SPINE WITHOUT CONTRAST  TECHNIQUE: Multidetector CT imaging of the head and  cervical spine was performed following the standard protocol without intravenous contrast. Multiplanar CT image reconstructions of the cervical spine were also generated.  COMPARISON:  CT head 01/21/2011  FINDINGS: CT HEAD FINDINGS  Moderate to advanced atrophy. Atherosclerotic disease. Mild chronic microvascular ischemic change in the white matter.  Negative for acute infarct, hemorrhage, or mass.  Negative for skull fracture. Mucosal thickening left maxillary sinus.  CT CERVICAL SPINE FINDINGS  Negative for fracture.  Cervical degenerative changes are most severe at C5-6 and C6-7 with disc and facet degeneration and moderate spinal stenosis. There is foraminal encroachment bilaterally due to spurring at C5-6 and C6-7.  IMPRESSION: No acute intracranial abnormality. Mild chronic microvascular ischemia.  Cervical spondylosis without fracture.   Electronically Signed   By: Franchot Gallo M.D.   On: 08/01/2013 16:53     EKG Interpretation   Date/Time:  Sunday Aug 01 2013 17:15:14 EDT Ventricular Rate:  71 PR Interval:    QRS Duration: 94 QT Interval:  407 QTC Calculation: 442 R Axis:    0 Text Interpretation:  Atrial fibrillation Ventricular premature complex  Anterior infarct, old nonspecific ST changes Confirmed by Wyvonnia Dusky  MD,  [place] [phone]) on 08/01/2013 5:31:12 PM      MDM   Final diagnoses:  None   Patient with fall from bed around 2 PM presenting with right hip pain. Uncertain loss of consciousness.  Right hip is shortened and externally rotated.  CT head and C-spine are negative. Right x-ray confirms intertrochanteric hip fracture. Neurovascularly intact. Discussed with Dr. Rolena Infante in [facility] orthopedics who will consult  Hip x-ray shows dilated bowel loops. Will obtain abdominal imaging. Patient also has history of aortic aneurysm. Admission discussed with Dr. [place] Pink who prefers to wait for CT results before accepting admission.  CT abdomen shows stable AAA. Ileus noted without mention of obstruction.    Ezequiel Essex, MD 08/02/13 [phone]

## 2013-08-01 NOTE — ED Notes (Addendum)
Dr. [place] Pink at bedside- updated on patients status. Daughter a part of decision-making. Patient sleeping.

## 2013-08-01 NOTE — ED Notes (Addendum)
RN took patient to CT angio and returned to room.   Patient in no acute distress patient transferred using sliding board.

## 2013-08-01 NOTE — ED Notes (Addendum)
Admitting updated sts patient will be placed on stepdown bed, family updated.

## 2013-08-01 NOTE — ED Notes (Signed)
Radiology called.  

## 2013-08-01 NOTE — ED Provider Notes (Signed)
I saw and evaluated the patient, reviewed the resident's note and I agree with the findings and plan.   EKG Interpretation   Date/Time:  Saturday Jul 31 2013 18:35:40 EDT Ventricular Rate:  91 PR Interval:    QRS Duration: 96 QT Interval:  354 QTC Calculation: 435 R Axis:   -156 Text Interpretation:  ** Suspect arm lead reversal, interpretation  assumes no reversal Atrial fibrillation Low voltage QRS Septal infarct ,  age undetermined Lateral infarct , age undetermined Abnormal ECG No  significant change was found Confirmed by [facility]  MD, TREY ([id number]) on  08/01/2013 12:13:00 AM      80 yo male with cough for a few days, fever at home, and transient complaint of chest pain and shortness of breath.  Pt has hx of dementia and denies these symptoms now.  On exam, elderly, thin, but nontoxic, not distressed, lungs CTAB, heart sounds irregularly irregular, abdomen soft and nontender.  ED workup unremarkable. Troponins negative. Plan DC with outpatient followup.  Clinical Impression: 1. Chest pain   2. SOB (shortness of breath)   3. Community acquired pneumonia       [name] III, MD 08/01/13 [phone]

## 2013-08-01 NOTE — Consult Note (Signed)
Patient ID: [name] MRN: [id number] DOB/AGE: March 09, 1924 80 y.o.  Admit date: 08/01/2013  Admission Diagnoses:  Active Problems:   * No active hospital problems. *   HPI: 80 yo wm is being seen for right hip fracture.  Was treated and released from the ED yesterday for pneumonia.  Daughter who is present states that she thinks he was sitting at the edge of his bed today when he fell off.  Was brought to the ED and xrays showed a IT hip fracture    Patient is a poor historian due to hx of dementia and being given narcotic pain meds.    Past Medical History: Past Medical History  Diagnosis Date  . Atrial fibrillation   . AAA (abdominal aortic aneurysm)   . Carotid stenosis   . Hypercholesteremia   . Cardiomyopathy     improved  . Renal artery stenosis   . COPD (chronic obstructive pulmonary disease)   . Osteoarthritis   . Atrial flutter     s/p ablation  . Hip fracture   . DEMENTIA   . Hypertension   . Macular degeneration disease   . Prostate cancer     Surgical History: Past Surgical History  Procedure Laterality Date  . Appendectomy    . Cholecystectomy    . Tonsillectomy      Family History: Family History  Problem Relation Age of Onset  . Heart attack Father     Social History: History   Social History  . Marital Status: Widowed    Spouse Name: N/A    Number of Children: N/A  . Years of Education: N/A   Occupational History  . Not on file.   Social History Main Topics  . Smoking status: Former Smoker -- 1.00 packs/day for 15 years    Types: Cigarettes    Quit date: 01/21/2001  . Smokeless tobacco: Former Systems developer  . Alcohol Use: No  . Drug Use: No  . Sexual Activity: Not on file   Other Topics Concern  . Not on file   Social History Narrative   ** Merged History Encounter **        Allergies: Review of patient's allergies indicates no known allergies.  Medications: I have reviewed the patient's current medications.  Vital  Signs: Patient Vitals for the past 24 hrs:  BP Temp Temp src Pulse Resp SpO2 Height Weight  08/01/13 1734 123/77 mmHg - - - 19 95 % - -  08/01/13 1600 113/46 mmHg - - 58 26 95 % - -  08/01/13 1545 110/58 mmHg - - 60 25 96 % - -  08/01/13 1532 115/45 mmHg 97.7 F (36.5 C) Oral 50 16 94 % 6\' 2"  (1.88 m) 64.411 kg (142 lb)  08/01/13 1530 115/45 mmHg - - 71 - 96 % - -    Radiology: Dg Chest 1 View  08/01/2013   CLINICAL DATA:  Fall.  Right hip pain.  EXAM: CHEST - 1 VIEW  COMPARISON:  DG CHEST 2 VIEW dated 07/31/2013  FINDINGS: Wedge resection clips in the left lung.  Thoracic spondylosis. Atherosclerotic calcification of the aortic arch. Old granulomatous disease bilaterally with calcified pulmonary granulomas.  Mild cardiomegaly. Indistinct vasculature with cephalization of blood flow favoring pulmonary venous hypertension.  Degenerative right glenohumeral arthropathy.  IMPRESSION: 1. Cardiomegaly with cephalization of blood flow suggesting pulmonary venous hypertension. 2. Old granulomatous disease. 3. Bony demineralization. 4. Atherosclerosis.   Electronically Signed   By: Sherryl Barters M.D.  On: 08/01/2013 17:11   Dg Chest 2 View  07/31/2013   CLINICAL DATA:  Cough and chest pain  EXAM: CHEST  2 VIEW  COMPARISON:  01/21/2011  FINDINGS: Evidence of granulomatous disease reidentified. Heart size upper limits of normal. Clips project over the right upper quadrant. No pleural effusion. No acute osseous abnormality. Flowing osteophytosis is noted throughout the thoracic spine. Left upper lobe chain sutures noted.  IMPRESSION: No acute cardiopulmonary process. Evidence of granulomas disease reidentified.   Electronically Signed   By: Conchita Paris M.D.   On: 07/31/2013 19:22   Dg Hip Complete Right  08/01/2013   CLINICAL DATA:  Fall.  Right hip pain.  EXAM: RIGHT HIP - COMPLETE 2+ VIEW  COMPARISON:  CT HIP*R* W/O CM dated 04/27/2010  FINDINGS: Left dynamic hip screw.  Prostate seed implants noted.   Prominent bony demineralization. Acute right hip intertrochanteric fracture is present. There is a separate lesser trochanteric fragment.  Old deformity from prior right pubic ramus fractures noted.  Mildly dilated loops of bowel in the pelvis including a 4.2 cm right pelvic small bowel loop, possibly posttraumatic ileus.  IMPRESSION: 1. Acute right intertrochanteric fracture, with separate lesser trochanteric fragment. 2. Old deformity in the right pubic bones from old fractures. 3. Prominent bony demineralization. 4. Mildly dilated small bowel in the pelvis, query posttraumatic ileus.   Electronically Signed   By: Sherryl Barters M.D.   On: 08/01/2013 17:09   Ct Cervical Spine Wo Contrast  08/01/2013   CLINICAL DATA:  Found on floor.  EXAM: CT HEAD WITHOUT CONTRAST  CT CERVICAL SPINE WITHOUT CONTRAST  TECHNIQUE: Multidetector CT imaging of the head and cervical spine was performed following the standard protocol without intravenous contrast. Multiplanar CT image reconstructions of the cervical spine were also generated.  COMPARISON:  CT head 01/21/2011  FINDINGS: CT HEAD FINDINGS  Moderate to advanced atrophy. Atherosclerotic disease. Mild chronic microvascular ischemic change in the white matter.  Negative for acute infarct, hemorrhage, or mass.  Negative for skull fracture. Mucosal thickening left maxillary sinus.  CT CERVICAL SPINE FINDINGS  Negative for fracture.  Cervical degenerative changes are most severe at C5-6 and C6-7 with disc and facet degeneration and moderate spinal stenosis. There is foraminal encroachment bilaterally due to spurring at C5-6 and C6-7.  IMPRESSION: No acute intracranial abnormality. Mild chronic microvascular ischemia.  Cervical spondylosis without fracture.   Electronically Signed   By: Franchot Gallo M.D.   On: 08/01/2013 16:53    Labs:  Recent Labs  07/31/13 1842 08/01/13 1554  WBC 9.6 11.3*  RBC 4.52 4.24  HCT 42.4 39.9  PLT 222 215    Recent Labs   07/31/13 1842 08/01/13 1554  NA 140 137  K 4.1 4.0  CL 99 99  CO2 27 25  BUN 12 16  CREATININE 0.83 1.02  GLUCOSE 112* 122*  CALCIUM 9.7 9.2    Recent Labs  08/01/13 1554  INR 1.04    Review of Systems: Review of Systems  Respiratory: Positive for cough.   Musculoskeletal: Positive for falls and joint pain.    Physical Exam: Pos log roll.  Slight shortening of right LE and externally rotated.  Hip tender to palpation.  Calf nt, nvi.    Assessment and Plan: Right hip fracture. Multiple medical issues Advised daughter that treatment for this issue would be ORIF.  Same as what he had on the left hip.  Will obviously need medical and cardiac clearance before proceeding.  Was  just released from the ED yesterday and diagnosed with pneumonia.  Medicine service will admit.  Will have ortho tech apply 5lbs bucks traction.    Melina Schools, MD [place] [phone]  Agree with above Patient seen at later time. Will require ORIF of hip when medically cleared Will discuss with my partner Dr Alvan Dame to provide definitive fracture management

## 2013-08-01 NOTE — Discharge Instructions (Signed)
Chest Pain (Nonspecific) °It is often hard to give a specific diagnosis for the cause of chest pain. There is always a chance that your pain could be related to something serious, such as a heart attack or a blood clot in the lungs. You need to follow up with your caregiver for further evaluation. °CAUSES  °· Heartburn. °· Pneumonia or bronchitis. °· Anxiety or stress. °· Inflammation around your heart (pericarditis) or lung (pleuritis or pleurisy). °· A blood clot in the lung. °· A collapsed lung (pneumothorax). It can develop suddenly on its own (spontaneous pneumothorax) or from injury (trauma) to the chest. °· Shingles infection (herpes zoster virus). °The chest wall is composed of bones, muscles, and cartilage. Any of these can be the source of the pain. °· The bones can be bruised by injury. °· The muscles or cartilage can be strained by coughing or overwork. °· The cartilage can be affected by inflammation and become sore (costochondritis). °DIAGNOSIS  °Lab tests or other studies, such as X-rays, electrocardiography, stress testing, or cardiac imaging, may be needed to find the cause of your pain.  °TREATMENT  °· Treatment depends on what may be causing your chest pain. Treatment may include: °· Acid blockers for heartburn. °· Anti-inflammatory medicine. °· Pain medicine for inflammatory conditions. °· Antibiotics if an infection is present. °· You may be advised to change lifestyle habits. This includes stopping smoking and avoiding alcohol, caffeine, and chocolate. °· You may be advised to keep your head raised (elevated) when sleeping. This reduces the chance of acid going backward from your stomach into your esophagus. °· Most of the time, nonspecific chest pain will improve within 2 to 3 days with rest and mild pain medicine. °HOME CARE INSTRUCTIONS  °· If antibiotics were prescribed, take your antibiotics as directed. Finish them even if you start to feel better. °· For the next few days, avoid physical  activities that bring on chest pain. Continue physical activities as directed. °· Do not smoke. °· Avoid drinking alcohol. °· Only take over-the-counter or prescription medicine for pain, discomfort, or fever as directed by your caregiver. °· Follow your caregiver's suggestions for further testing if your chest pain does not go away. °· Keep any follow-up appointments you made. If you do not go to an appointment, you could develop lasting (chronic) problems with pain. If there is any problem keeping an appointment, you must call to reschedule. °SEEK MEDICAL CARE IF:  °· You think you are having problems from the medicine you are taking. Read your medicine instructions carefully. °· Your chest pain does not go away, even after treatment. °· You develop a rash with blisters on your chest. °SEEK IMMEDIATE MEDICAL CARE IF:  °· You have increased chest pain or pain that spreads to your arm, neck, jaw, back, or abdomen. °· You develop shortness of breath, an increasing cough, or you are coughing up blood. °· You have severe back or abdominal pain, feel nauseous, or vomit. °· You develop severe weakness, fainting, or chills. °· You have a fever. °THIS IS AN EMERGENCY. Do not wait to see if the pain will go away. Get medical help at once. Call your local emergency services (911 in [place] Do not drive yourself to the hospital. °MAKE SURE YOU:  °· Understand these instructions. °· Will watch your condition. °· Will get help right away if you are not doing well or get worse. °Document Released: 12/12/2004 Document Revised: 05/27/2011 Document Reviewed: 10/08/2007 °[employer]® Patient Information ©2014 [employer]   [employer]. ° °

## 2013-08-01 NOTE — H&P (Addendum)
[facility] History and Physical  [name] CBJ:[id number] DOB: Jun 18, 1923 DOA: 08/01/2013   PCP: [name], MD  Specialists: has been followed by Dr. Stanford Breed in the past for his atrial fibrillation  Chief Complaint: pain in the right hip  HPI: [name] is a 80 y.o. male with a past medical history of atrial fibrillation, not on anticoagulation due to bleeding issues in the past, history of prostate cancer, history of a CVA, history of dementia, who lives with his daughter. Patient is accompanied by his daughter today. Patient has dementia and is unable to provide any history. According to the daughter he was sitting at the side of the bed and was feeling thirsty. She turned around to go to the kitchen and had walked a few steps and she heard a crashing sound. She immediately come back to his room and he was lying on the floor. He was awake. Apparently, there was no syncopal episode. He complained of pain in the right hip area. EMS was called and the patient was brought into the hospital. Over the past couple of weeks patient has been having cough and yesterday he had a fever up to 103F. He was brought into the emergency department. Chest x-ray was inconclusive, but based on clinical presentation he was thought to have some kind of respiratory tract infection and was prescribed Levaquin. She hasn't yet filled that prescription. Patient has had dysphagia in the past and used to be on a special diet, but not anymore according to the daughter. Denies any sick contacts. No further history is available from the patient due to his dementia  Home Medications: Prior to Admission medications   Medication Sig Start Date End Date Taking? Authorizing Provider  aspirin EC 81 MG tablet Take 81 mg by mouth every evening.    Yes Historical Provider, MD  diltiazem (CARDIZEM CD) 180 MG 24 hr capsule Take 180 mg by mouth daily.   Yes Historical Provider, MD  metoprolol (LOPRESSOR) 50 MG  tablet Take 50 mg by mouth 2 (two) times daily.   Yes Historical Provider, MD  potassium chloride SA (K-DUR,KLOR-CON) 20 MEQ tablet Take 20 mEq by mouth 2 (two) times daily.   Yes Historical Provider, MD  pravastatin (PRAVACHOL) 40 MG tablet Take 40 mg by mouth at bedtime.    Yes Historical Provider, MD  levofloxacin (LEVAQUIN) 750 MG tablet Take 1 tablet (750 mg total) by mouth daily. X 7 days 08/01/13   Corlis Leak, MD    Allergies: No Known Allergies  Past Medical History: Past Medical History  Diagnosis Date  . Atrial fibrillation   . AAA (abdominal aortic aneurysm)   . Carotid stenosis   . Hypercholesteremia   . Cardiomyopathy     improved  . Renal artery stenosis   . COPD (chronic obstructive pulmonary disease)   . Osteoarthritis   . Atrial flutter     s/p ablation  . Hip fracture   . DEMENTIA   . Hypertension   . Macular degeneration disease   . Prostate cancer     Past Surgical History  Procedure Laterality Date  . Appendectomy    . Cholecystectomy    . Tonsillectomy      Social History: lives with his daughter. Quit smoking 10 years ago. Has approximately 40-50-pack-year history of smoking. No alcohol use. Has a walker, but doesn't really ambulate much.  Family History:  Family History  Problem Relation Age of Onset  . Heart attack Father  Review of Systems - unable to do due to his dementia  Physical Examination  Filed Vitals:   08/01/13 1815 08/01/13 1830 08/01/13 1845 08/01/13 1900  BP: 111/63 116/68 121/52 106/60  Pulse: 87 84 90 83  Temp:      TempSrc:      Resp:      Height:      Weight:      SpO2: 93% 92% 90% 91%    BP 106/60  Pulse 83  Temp(Src) 97.7 F (36.5 C) (Oral)  Resp 19  Ht $R'6\' 2"'WE$  (1.88 m)  Wt 64.411 kg (142 lb)  BMI 18.22 kg/m2  SpO2 91%  General appearance: appears stated age, distracted, no distress and slowed mentation Head: Normocephalic, without obvious abnormality, atraumatic Eyes: conjunctivae/corneas clear.  PERRL, EOM's intact.  Throat: lips, mucosa, and tongue normal; teeth and gums normal Neck: no adenopathy, no carotid bruit, no JVD, supple, symmetrical, trachea midline and thyroid not enlarged, symmetric, no tenderness/mass/nodules Resp: clear to auscultation bilaterally Cardio: S1-S2 is irregularly irregular. No S3, S4. No rubs, murmurs, or bruit. No pedal edema. GI: soft, non-tender; bowel sounds normal; no masses,  no organomegaly Extremities: right lower extremity is externally located. Pulses: good radial pulses were poorly palpable Lower extremity pulses Skin: Skin color, texture, turgor normal. No rashes or lesions Lymph nodes: Cervical, supraclavicular, and axillary nodes normal. Neurologic: he is alert. Disoriented. No focal neurological deficits noted.  Laboratory Data: Results for orders placed during the hospital encounter of 08/01/13 (from the past 48 hour(s))  TROPONIN I     Status: None   Collection Time    08/01/13  3:29 PM      Result Value Ref Range   Troponin I <0.30  <0.30 ng/mL   Comment:            Due to the release kinetics of cTnI,     a negative result within the first hours     of the onset of symptoms does not rule out     myocardial infarction with certainty.     If myocardial infarction is still suspected,     repeat the test at appropriate intervals.  CBC WITH DIFFERENTIAL     Status: Abnormal   Collection Time    08/01/13  3:54 PM      Result Value Ref Range   WBC 11.3 (*) 4.0 - 10.5 K/uL   RBC 4.24  4.22 - 5.81 MIL/uL   Hemoglobin 13.2  13.0 - 17.0 g/dL   HCT 39.9  39.0 - 52.0 %   MCV 94.1  78.0 - 100.0 fL   MCH 31.1  26.0 - 34.0 pg   MCHC 33.1  30.0 - 36.0 g/dL   RDW 14.5  11.5 - 15.5 %   Platelets 215  150 - 400 K/uL   Neutrophils Relative % 78 (*) 43 - 77 %   Neutro Abs 8.9 (*) 1.7 - 7.7 K/uL   Lymphocytes Relative 11 (*) 12 - 46 %   Lymphs Abs 1.3  0.7 - 4.0 K/uL   Monocytes Relative 10  3 - 12 %   Monocytes Absolute 1.1 (*) 0.1 - 1.0  K/uL   Eosinophils Relative 1  0 - 5 %   Eosinophils Absolute 0.1  0.0 - 0.7 K/uL   Basophils Relative 0  0 - 1 %   Basophils Absolute 0.0  0.0 - 0.1 K/uL  COMPREHENSIVE METABOLIC PANEL     Status: Abnormal   Collection Time  08/01/13  3:54 PM      Result Value Ref Range   Sodium 137  137 - 147 mEq/L   Potassium 4.0  3.7 - 5.3 mEq/L   Chloride 99  96 - 112 mEq/L   CO2 25  19 - 32 mEq/L   Glucose, Bld 122 (*) 70 - 99 mg/dL   BUN 16  6 - 23 mg/dL   Creatinine, Ser 1.02  0.50 - 1.35 mg/dL   Calcium 9.2  8.4 - 10.5 mg/dL   Total Protein 7.1  6.0 - 8.3 g/dL   Albumin 3.7  3.5 - 5.2 g/dL   AST 27  0 - 37 U/L   ALT 22  0 - 53 U/L   Alkaline Phosphatase 132 (*) 39 - 117 U/L   Total Bilirubin 0.7  0.3 - 1.2 mg/dL   GFR calc non Af Amer 64 (*) >90 mL/min   GFR calc Af Amer 74 (*) >90 mL/min   Comment: (NOTE)     The eGFR has been calculated using the CKD EPI equation.     This calculation has not been validated in all clinical situations.     eGFR's persistently <90 mL/min signify possible Chronic Kidney     Disease.  PROTIME-INR     Status: None   Collection Time    08/01/13  3:54 PM      Result Value Ref Range   Prothrombin Time 13.4  11.6 - 15.2 seconds   INR 1.04  0.00 - 1.49    Radiology Reports: Dg Chest 1 View  08/01/2013   CLINICAL DATA:  Fall.  Right hip pain.  EXAM: CHEST - 1 VIEW  COMPARISON:  DG CHEST 2 VIEW dated 07/31/2013  FINDINGS: Wedge resection clips in the left lung.  Thoracic spondylosis. Atherosclerotic calcification of the aortic arch. Old granulomatous disease bilaterally with calcified pulmonary granulomas.  Mild cardiomegaly. Indistinct vasculature with cephalization of blood flow favoring pulmonary venous hypertension.  Degenerative right glenohumeral arthropathy.  IMPRESSION: 1. Cardiomegaly with cephalization of blood flow suggesting pulmonary venous hypertension. 2. Old granulomatous disease. 3. Bony demineralization. 4. Atherosclerosis.   Electronically  Signed   By: Sherryl Barters M.D.   On: 08/01/2013 17:11   Dg Chest 2 View  07/31/2013   CLINICAL DATA:  Cough and chest pain  EXAM: CHEST  2 VIEW  COMPARISON:  01/21/2011  FINDINGS: Evidence of granulomatous disease reidentified. Heart size upper limits of normal. Clips project over the right upper quadrant. No pleural effusion. No acute osseous abnormality. Flowing osteophytosis is noted throughout the thoracic spine. Left upper lobe chain sutures noted.  IMPRESSION: No acute cardiopulmonary process. Evidence of granulomas disease reidentified.   Electronically Signed   By: Conchita Paris M.D.   On: 07/31/2013 19:22   Dg Hip Complete Right  08/01/2013   CLINICAL DATA:  Fall.  Right hip pain.  EXAM: RIGHT HIP - COMPLETE 2+ VIEW  COMPARISON:  CT HIP*R* W/O CM dated 04/27/2010  FINDINGS: Left dynamic hip screw.  Prostate seed implants noted.  Prominent bony demineralization. Acute right hip intertrochanteric fracture is present. There is a separate lesser trochanteric fragment.  Old deformity from prior right pubic ramus fractures noted.  Mildly dilated loops of bowel in the pelvis including a 4.2 cm right pelvic small bowel loop, possibly posttraumatic ileus.  IMPRESSION: 1. Acute right intertrochanteric fracture, with separate lesser trochanteric fragment. 2. Old deformity in the right pubic bones from old fractures. 3. Prominent bony demineralization. 4. Mildly dilated  small bowel in the pelvis, query posttraumatic ileus.   Electronically Signed   By: Sherryl Barters M.D.   On: 08/01/2013 17:09   Ct Cervical Spine Wo Contrast  08/01/2013   CLINICAL DATA:  Found on floor.  EXAM: CT HEAD WITHOUT CONTRAST  CT CERVICAL SPINE WITHOUT CONTRAST  TECHNIQUE: Multidetector CT imaging of the head and cervical spine was performed following the standard protocol without intravenous contrast. Multiplanar CT image reconstructions of the cervical spine were also generated.  COMPARISON:  CT head 01/21/2011  FINDINGS: CT  HEAD FINDINGS  Moderate to advanced atrophy. Atherosclerotic disease. Mild chronic microvascular ischemic change in the white matter.  Negative for acute infarct, hemorrhage, or mass.  Negative for skull fracture. Mucosal thickening left maxillary sinus.  CT CERVICAL SPINE FINDINGS  Negative for fracture.  Cervical degenerative changes are most severe at C5-6 and C6-7 with disc and facet degeneration and moderate spinal stenosis. There is foraminal encroachment bilaterally due to spurring at C5-6 and C6-7.  IMPRESSION: No acute intracranial abnormality. Mild chronic microvascular ischemia.  Cervical spondylosis without fracture.   Electronically Signed   By: Franchot Gallo M.D.   On: 08/01/2013 16:53   Ct Abdomen Pelvis W Contrast  08/01/2013   CLINICAL DATA:  Abdominal pain.  EXAM: CT ABDOMEN AND PELVIS WITH CONTRAST  TECHNIQUE: Multidetector CT imaging of the abdomen and pelvis was performed using the standard protocol following bolus administration of intravenous contrast.  CONTRAST:  12mL OMNIPAQUE IOHEXOL 300 MG/ML  SOLN  COMPARISON:  None.  FINDINGS: Areas of atelectasis versus scarring within the lung bases. Evaluation is degraded by respiratory motion artifact.  Patient is status post cholecystectomy. The liver is otherwise unremarkable. The hepatic flexure is interposed between the liver and the anterior abdominal wall.  The adrenals and pancreas are unremarkable. Diffuse punctate calcifications within the spleen consistent prior granulomatous disease Bilateral nonenhancing benign-appearing cyst within the kidneys. No evidence hydronephrosis or solid masses.  An infrarenal abdominal aortic aneurysm is appreciated slightly larger when compared to previous ultrasound report dated 05/10/2010. Present measurements are 4.5 x 4.2 cm previous maximal diameter 4 cm. Mural calcifications are appreciated within the abdominal aorta and mesenteric vessels.  No abdominal or pelvic masses, free fluid, nor loculated  fluid collections. Multiple air-filled loops of large and air-filled distended loops of small bowel. A transition point is appreciated within the distal sigmoid colon in the posterior aspect of the pelvis on the left. Fluid filled rectosigmoid colon is appreciated.  No abdominal or pelvic masses, free fluid, nor loculated fluid collections.  A comminuted intertrochanteric right hip fracture. A superior right pubis ramus fracture is appreciated age indeterminate. Multilevel spondylosis within the spine. The left hip status postop reduction internal fixation.  IMPRESSION: 1. Comminuted intertrochanteric right hip fracture 2. Superior pubic ramus fracture on the right.  Age-indeterminate 3. Findings consistent with an ileus. 4. Slight increased size of the patient's known infrarenal abdominal aortic aneurysm 5. Bilateral Bosniak category 1 cysts 6. Scarring versus atelectasis within the lung bases   Electronically Signed   By: Margaree Mackintosh M.D.   On: 08/01/2013 19:55    Electrocardiogram: atrial fibrillation at 71 beats per minute. Normal axis. No definite Q waves. Poor R wave progression. No concerning ST changes. This is a poor quality EKG.  Problem List  Principal Problem:   Hip fracture, right Active Problems:   FIBRILLATION, ATRIAL   ANEURYSM, ABDOMINAL AORTIC   COPD   H/O   History of prostate cancer  Dementia   Assessment: this is an 80 year old, Caucasian male, who presents after a most likely a mechanical fall resulting in fracture of his right hip. He has multiple comorbidities, including history of atrial fibrillation. He also has an abdominal aortic aneurysm, which is considered to be inoperable due to his medical comorbidities.  Plan: #1 right hip fracture: This is most likely a mechanical fall. [facility] Safe Perioperative Cardiac Risk Index is about 2.58% for perioperative complications. He does have atrial fibrillation, but his heart rate is well controlled. Beta blocker will need to be  continued. He does not have any unstable cardiac process at this time. So, at this time he may proceed to surgery without further testing. Daughter understands the higher than normal risk. Orthopedics has been consulted. Pulmonary status does make him high risk for prolonged ventilatory support. **SEE ADDENDUM BELOW**  #2 history of atrial fibrillation: Heart rate is reasonably well controlled. Continue with his beta blocker and his calcium channel blocker. He's not on anticoagulation due to bleeding complications in the past according to the daughter. Continue with aspirin.  #3 recent cough and fever: Chest x-ray yesterday did not show any infiltrates nor does the chest x-ray today. We will continue with Levaquin for now. He is afebrile today. WBC is minimally elevated. Continue to monitor clinically. Due to his dementia will get a swallow evaluation to look for aspiration and dysphagia.  #4 history of abdominal aortic aneurysm: Size appears to be minimally larger on tonight's CT scan compared to previous. The patient is not operable candidate as per daughter.  #5 ileus: Abdomen was benign for the most part. Unclear why he is noted to have ileus on the CT scan. His daughter did mention episodes of diarrhea over the last few days. No mention of obstruction on the CT. Will utilize stool softeners. We'll repeat abdominal films in the morning. He does not have any nausea, or vomiting. If he doesn't improve he may require surgical consultation.  #6 history of dementia: Most probably vascular considering history of stroke. CT head does not show any acute process. Swallow evaluation, as discussed earlier.   DVT Prophylaxis: SCDs Code Status: DNR/DNI per her daughter Family Communication: discussed with the daughter  Disposition Plan: admit to telemetry.   Further management decisions will depend on results of further testing and patient's response to treatment.   Bonnielee Haff  [facility] Pager [phone]  If 7PM-7AM, please contact night-coverage [email or web address] Password [facility]  08/01/2013, 8:22 PM  Disclaimer: This note was dictated with voice recognition software. Similar sounding words can inadvertently be transcribed and may not be corrected upon review.   Was called by the RN that the patient was hypoxic. His saturations dropped into the mid 80s. Patient complained of some shortness of breath, and chest pain in the left side. Repeat chest x-ray showed possible atelectasis versus consolidation in the left lower base. It's quite possible that he may be developing a pneumonia or could have aspirated. Both these possibilities were discussed earlier. However, the sudden drop in oxygen requires further evaluation. So, we will proceed with a CT angio chest.  Bonnielee Haff 10:53 PM

## 2013-08-02 ENCOUNTER — Inpatient Hospital Stay (HOSPITAL_COMMUNITY): Payer: Medicare Other

## 2013-08-02 DIAGNOSIS — R509 Fever, unspecified: Secondary | ICD-10-CM | POA: Diagnosis present

## 2013-08-02 DIAGNOSIS — J9601 Acute respiratory failure with hypoxia: Secondary | ICD-10-CM | POA: Diagnosis present

## 2013-08-02 DIAGNOSIS — J9811 Atelectasis: Secondary | ICD-10-CM | POA: Diagnosis present

## 2013-08-02 DIAGNOSIS — G9341 Metabolic encephalopathy: Secondary | ICD-10-CM | POA: Diagnosis present

## 2013-08-02 LAB — COMPREHENSIVE METABOLIC PANEL
ALBUMIN: 3.4 g/dL — AB (ref 3.5–5.2)
ALT: 20 U/L (ref 0–53)
AST: 31 U/L (ref 0–37)
Alkaline Phosphatase: 124 U/L — ABNORMAL HIGH (ref 39–117)
BILIRUBIN TOTAL: 0.9 mg/dL (ref 0.3–1.2)
BUN: 14 mg/dL (ref 6–23)
CO2: 25 mEq/L (ref 19–32)
Calcium: 9 mg/dL (ref 8.4–10.5)
Chloride: 99 mEq/L (ref 96–112)
Creatinine, Ser: 0.82 mg/dL (ref 0.50–1.35)
GFR calc Af Amer: 90 mL/min — ABNORMAL LOW (ref >90)
GFR, EST NON AFRICAN AMERICAN: 77 mL/min — AB (ref >90)
Glucose, Bld: 161 mg/dL — ABNORMAL HIGH (ref 70–99)
Potassium: 4.2 mEq/L (ref 3.7–5.3)
Sodium: 137 mEq/L (ref 137–147)
Total Protein: 6.5 g/dL (ref 6.0–8.3)

## 2013-08-02 LAB — CBC
HCT: 38.7 % — ABNORMAL LOW (ref 39.0–52.0)
Hemoglobin: 12.8 g/dL — ABNORMAL LOW (ref 13.0–17.0)
MCH: 30.8 pg (ref 26.0–34.0)
MCHC: 33.1 g/dL (ref 30.0–36.0)
MCV: 93 fL (ref 78.0–100.0)
Platelets: 181 10*3/uL (ref 150–400)
RBC: 4.16 MIL/uL — AB (ref 4.22–5.81)
RDW: 14.6 % (ref 11.5–15.5)
WBC: 13 10*3/uL — ABNORMAL HIGH (ref 4.0–10.5)

## 2013-08-02 LAB — MRSA PCR SCREENING: MRSA BY PCR: NEGATIVE

## 2013-08-02 MED ORDER — ACETAMINOPHEN 650 MG RE SUPP: 650.0000 mg | Freq: Four times a day (QID) | RECTAL | Status: DC | PRN

## 2013-08-02 MED ORDER — METOPROLOL TARTRATE 50 MG PO TABS
50.0000 mg | ORAL_TABLET | Freq: Two times a day (BID) | ORAL | Status: DC
  Administered 2013-08-02: 50 mg via ORAL
  Filled 2013-08-02 (×2): qty 1

## 2013-08-02 MED ORDER — ONDANSETRON HCL 4 MG/2ML IJ SOLN: 4.0000 mg | Freq: Four times a day (QID) | INTRAMUSCULAR | Status: DC | PRN

## 2013-08-02 MED ORDER — METOPROLOL TARTRATE 1 MG/ML IV SOLN: 5.0000 mg | Freq: Four times a day (QID) | INTRAVENOUS | Status: DC

## 2013-08-02 MED ORDER — ONDANSETRON HCL 4 MG PO TABS: 4.0000 mg | ORAL_TABLET | Freq: Four times a day (QID) | ORAL | Status: DC | PRN

## 2013-08-02 MED ORDER — DILTIAZEM HCL ER COATED BEADS 180 MG PO CP24
180.0000 mg | ORAL_CAPSULE | Freq: Every day | ORAL | Status: DC
  Filled 2013-08-02: qty 1

## 2013-08-02 MED ORDER — PRAVASTATIN SODIUM 40 MG PO TABS
40.0000 mg | ORAL_TABLET | Freq: Every day | ORAL | Status: DC
  Filled 2013-08-02: qty 1

## 2013-08-02 MED ORDER — DEXTROSE-NACL 5-0.45 % IV SOLN
INTRAVENOUS | Status: AC
  Administered 2013-08-02: 40 mL/h via INTRAVENOUS

## 2013-08-02 MED ORDER — DOCUSATE SODIUM 100 MG PO CAPS
100.0000 mg | ORAL_CAPSULE | Freq: Two times a day (BID) | ORAL | Status: DC
  Administered 2013-08-02: 100 mg via ORAL
  Filled 2013-08-02 (×2): qty 1

## 2013-08-02 MED ORDER — ASPIRIN EC 81 MG PO TBEC
81.0000 mg | DELAYED_RELEASE_TABLET | Freq: Every evening | ORAL | Status: DC
Start: 2013-08-02 — End: 2013-08-02
  Filled 2013-08-02: qty 1

## 2013-08-02 MED ORDER — ACETAMINOPHEN 325 MG PO TABS
650.0000 mg | ORAL_TABLET | Freq: Four times a day (QID) | ORAL | Status: DC | PRN
  Administered 2013-08-06: 650 mg via ORAL
  Filled 2013-08-02: qty 2

## 2013-08-02 MED ORDER — SODIUM CHLORIDE 0.9 % IJ SOLN
3.0000 mL | Freq: Two times a day (BID) | INTRAMUSCULAR | Status: DC
  Administered 2013-08-02 – 2013-08-12 (×13): 3 mL via INTRAVENOUS

## 2013-08-02 MED ORDER — LEVOFLOXACIN IN D5W 500 MG/100ML IV SOLN
500.0000 mg | Freq: Every day | INTRAVENOUS | Status: DC
  Administered 2013-08-02 – 2013-08-03 (×2): 500 mg via INTRAVENOUS
  Filled 2013-08-02 (×2): qty 100

## 2013-08-02 MED ORDER — HYDRALAZINE HCL 20 MG/ML IJ SOLN: 10.0000 mg | Freq: Four times a day (QID) | INTRAMUSCULAR | Status: DC | PRN

## 2013-08-02 MED ORDER — MORPHINE SULFATE 2 MG/ML IJ SOLN: 0.5000 mg | INTRAMUSCULAR | Status: DC | PRN

## 2013-08-02 MED ORDER — MORPHINE SULFATE 2 MG/ML IJ SOLN
1.0000 mg | INTRAMUSCULAR | Status: DC | PRN
  Administered 2013-08-02 – 2013-08-03 (×2): 1 mg via INTRAVENOUS
  Filled 2013-08-02 (×2): qty 1

## 2013-08-02 MED ORDER — DEXTROSE-NACL 5-0.45 % IV SOLN
INTRAVENOUS | Status: DC
  Administered 2013-08-03 (×2): via INTRAVENOUS
  Administered 2013-08-03: 1000 mL via INTRAVENOUS

## 2013-08-02 MED ORDER — METOPROLOL TARTRATE 1 MG/ML IV SOLN: 5.0000 mg | INTRAVENOUS | Status: DC | PRN

## 2013-08-02 MED ORDER — LEVALBUTEROL HCL 0.63 MG/3ML IN NEBU
0.6300 mg | INHALATION_SOLUTION | RESPIRATORY_TRACT | Status: DC | PRN
  Administered 2013-08-04: 0.63 mg via RESPIRATORY_TRACT
  Filled 2013-08-02: qty 3

## 2013-08-02 MED ORDER — HYDROCODONE-ACETAMINOPHEN 5-325 MG PO TABS
1.0000 | ORAL_TABLET | Freq: Four times a day (QID) | ORAL | Status: DC | PRN
  Administered 2013-08-02: 1 via ORAL
  Filled 2013-08-02: qty 1

## 2013-08-02 MED ORDER — SIMVASTATIN 20 MG PO TABS
20.0000 mg | ORAL_TABLET | Freq: Every day | ORAL | Status: DC
  Filled 2013-08-02: qty 1

## 2013-08-02 MED ORDER — METOPROLOL TARTRATE 1 MG/ML IV SOLN
10.0000 mg | Freq: Four times a day (QID) | INTRAVENOUS | Status: DC
  Administered 2013-08-02 – 2013-08-03 (×4): 10 mg via INTRAVENOUS
  Filled 2013-08-02 (×7): qty 10

## 2013-08-02 MED ORDER — GUAIFENESIN 100 MG/5ML PO SYRP
200.0000 mg | ORAL_SOLUTION | ORAL | Status: DC | PRN
  Administered 2013-08-02: 200 mg via ORAL
  Filled 2013-08-02: qty 10

## 2013-08-02 NOTE — Progress Notes (Addendum)
Subjective: Patient comfortable.  Daughter present.    Objective: Vital signs in last 24 hours: Temp:  [97 F (36.1 C)-97.7 F (36.5 C)] 97.4 F (36.3 C) (05/18 1212) Pulse Rate:  [50-126] 83 (05/18 1212) Resp:  [16-32] 27 (05/18 1212) BP: (106-152)/(45-99) 140/99 mmHg (05/18 1212) SpO2:  [87 %-100 %] 100 % (05/18 1212) FiO2 (%):  [40 %-50 %] 50 % (05/18 0210) Weight:  [56.6 kg (124 lb 12.5 oz)-64.411 kg (142 lb)] 56.6 kg (124 lb 12.5 oz) (05/18 0401)  Intake/Output from previous day: 05/17 0701 - 05/18 0700 In: 3 [I.V.:3] Out: -  Intake/Output this shift: Total I/O In: 345 [I.V.:345] Out: -    Recent Labs  07/31/13 1842 08/01/13 1554 08/02/13 0450  HGB 14.2 13.2 12.8*    Recent Labs  08/01/13 1554 08/02/13 0450  WBC 11.3* 13.0*  RBC 4.24 4.16*  HCT 39.9 38.7*  PLT 215 181    Recent Labs  08/01/13 1554 08/02/13 0450  NA 137 137  K 4.0 4.2  CL 99 99  CO2 25 25  BUN 16 14  CREATININE 1.02 0.82  GLUCOSE 122* 161*  CALCIUM 9.2 9.0    Recent Labs  08/01/13 1554  INR 1.04    Neurovascular intact Sensation intact distally Dorsiflexion/Plantar flexion intact Compartment soft  Assessment/Plan: Right hip fracture. When patient is medically stable, Dr Adriana Mccallum will proceed with ORIF right hip.   Please let us know when patient is cleared.     Ventura Hollenbeck 08/02/2013, 12:59 PM     Agree with above

## 2013-08-02 NOTE — Progress Notes (Addendum)
[name]  [name] VPX:[id number] DOB: Oct 12, 1923 DOA: 08/01/2013 PCP: [name], MD  Time spent :  26mins  Brief narrative: 80 y.o. male with a past medical history of atrial fibrillation, not on anticoagulation due to bleeding issues in the past, history of prostate cancer, history of a CVA, history of dementia, who lives with his daughter. Patient was accompanied by his daughter. According to the daughter he was sitting at the side of the bed and was feeling thirsty. She turned around to go to the kitchen and had walked a few steps and she heard a crashing sound. She immediately came back to his room and found him lying on the floor. He was awake. Apparently, there was no syncopal episode. He had complained of pain in the right hip area. EMS was called and the patient was brought into the hospital. Evaluation revealed an acute right intertrochanteric fracture.  Over the past couple of weeks patient has been having cough and the day before he had a fever up to 103F. He was evaluated in the emergency department 5/16 . Chest x-ray was inconclusive, but based on clinical presentation he was thought to have some kind of respiratory tract infection and was prescribed Levaquin. She hadn't yet filled that prescription. Patient has had dysphagia in the past and used to be on a special diet, but not anymore according to the daughter. Denied any sick contacts.   HPI/Subjective: Pt has been sedated with pain medication-daughter at bedside.  Pt appears comfortable, but is requiring supplemental O2.    Assessment/Plan:  Hip fracture, right -Ortho following for planning of surgery  -[facility] traction pending -if respiratory status remains stable over night, pt will be medically cleared to precede w/ surgery on 5/19 with no clear excluding cardiac signs/sx, but with general frailty and advanced age carrying with it a signif risk of perioperative  cardiac event - this was discussed in very clear terms with his daughter at the bedside again today, recognizing that alternative is life-long bedridden status which also carries a high mortality rate  Acute respiratory failure with hypoxia:   A) Fever/Atelectasis - LLL pneumonia/pneumonitis likely due to aspiration   B) COPD -fever onset 24 hrs prior to hip fracture -CT Chest c/w LLL mucous plug v/s aspiration  -cont IVFs and pulmonary toilet with TCDB -cont nebs -cont IVFs  Possible ileus -no evidence on exam to suggest this clinically -keeping NPO regardless due to sedation -avoid NG unless vomiting noted    HYPERTENSION -BP controlled -too sleepy to swallow safely so change to IV meds  FIBRILLATION, ATRIAL - rate reasonably controlled -cont IV BB and since was on CCB pre admit if needed can use Cardizem gtt  ANEURYSM, ABDOMINAL AORTIC -not a surgical candidate  History of prostate cancer  Dementia/Metabolic encephalopathy -known dysphagia - consider SLP eval post-op to determine if there is a better/safe consistency for intake    DVT prophylaxis: SCDs Code Status: DO NOT RESUSCITATE Family Communication: lengthy discussion with daughter at bedside Disposition Plan/Expected LOS: SDU  Consultants: Orthopedics  Procedures: ORIF of right hip - pending  Antibiotics: levaquin 5/17 >>  Objective: Blood pressure 152/95, pulse 92, temperature 97.6 F (36.4 C), temperature source Axillary, resp. rate 20, height 6\' 2"  (1.88 m), weight 124 lb 12.5 oz (56.6 kg), SpO2 93.00%.  Intake/Output Summary (Last 24 hours) at 08/02/13 1205 Last data filed at 08/02/13 1102  Gross per 24 hour  Intake  188 ml  Output      0 ml  Net    188 ml   Exam: General: No acute respiratory distress Lungs: mild bibasilar crackles - no wheeze  Cardiovascular: Irregular rate and rhythm without murmur gallop or rub normal S1 and S2, no peripheral edema or JVD Abdomen: Nontender,  nondistended, soft, bowel sounds positive, no rebound, no ascites, no appreciable mass Musculoskeletal: No significant cyanosis, clubbing of bilateral lower extremities   Scheduled Meds:  Scheduled Meds: . levofloxacin (LEVAQUIN) IV  500 mg Intravenous Daily  . sodium chloride  3 mL Intravenous Q12H   Data Reviewed: Basic Metabolic Panel:  Recent Labs Lab 07/31/13 1842 08/01/13 1554 08/02/13 0450  NA 140 137 137  K 4.1 4.0 4.2  CL 99 99 99  CO2 27 25 25   GLUCOSE 112* 122* 161*  BUN 12 16 14   CREATININE 0.83 1.02 0.82  CALCIUM 9.7 9.2 9.0   Liver Function Tests:  Recent Labs Lab 08/01/13 1554 08/02/13 0450  AST 27 31  ALT 22 20  ALKPHOS 132* 124*  BILITOT 0.7 0.9  PROT 7.1 6.5  ALBUMIN 3.7 3.4*   CBC:  Recent Labs Lab 07/31/13 1842 08/01/13 1554 08/02/13 0450  WBC 9.6 11.3* 13.0*  NEUTROABS  --  8.9*  --   HGB 14.2 13.2 12.8*  HCT 42.4 39.9 38.7*  MCV 93.8 94.1 93.0  PLT 222 215 181   Cardiac Enzymes:  Recent Labs Lab 08/01/13 1529  TROPONINI <0.30   BNP (last 3 results)  Recent Labs  07/31/13 1842  PROBNP 1679.0*    Recent Results (from the past 240 hour(s))  MRSA PCR SCREENING     Status: None   Collection Time    08/02/13  2:06 AM      Result Value Ref Range Status   MRSA by PCR NEGATIVE  NEGATIVE Final   Comment:            The GeneXpert MRSA Assay (FDA     approved for NASAL specimens     only), is one component of a     comprehensive MRSA colonization     surveillance program. It is not     intended to diagnose MRSA     infection nor to guide or     monitor treatment for     MRSA infections.     Studies:  Recent x-ray studies have been reviewed in detail by the Attending Physician  Erin Hearing, ANP [facility] Office  [phone] Pager [phone]  **If unable to reach the above provider after paging please contact the [place] @ [phone]  On-Call/Text Page:      [email or web address]      password [email or web address]  If  7PM-7AM, please contact night-coverage [email or web address] Password [email or web address] 08/02/2013, 12:05 PM   LOS: 1 day   I have personally examined this patient and reviewed the entire database. I have reviewed the above note, made any necessary editorial changes, and agree with its content.  [name], MD [facility]

## 2013-08-02 NOTE — Progress Notes (Signed)
Received patient to unit, admitted to room 2c12. Patient alert to self, follows some commands, not a good historian, unable to answer admission questions appropriately. Initial assessment complete. No family at bedside. Bed alarm initiated, three side rails up. Patient safety maintained. Will continue to monitor.

## 2013-08-02 NOTE — Progress Notes (Signed)
Speech Language Pathology  Patient Details Name: [name] MRN: [id number] DOB: 04-12-1923 Today's Date: 08/02/2013 Time: 9242-     MBS scheduled for today at 1030.  Orbie Pyo [place].Ed [employer] [phone]  08/02/2013

## 2013-08-02 NOTE — Evaluation (Signed)
Clinical/Bedside Swallow Evaluation Patient Details  Name: [name] MRN: [id number] Date of Birth: 1923-10-04  Today's Date: 08/02/2013 Time: 1110-1127 SLP Time Calculation (min): 17 min  Past Medical History:  Past Medical History  Diagnosis Date  . Atrial fibrillation   . AAA (abdominal aortic aneurysm)   . Carotid stenosis   . Hypercholesteremia   . Cardiomyopathy     improved  . Renal artery stenosis   . COPD (chronic obstructive pulmonary disease)   . Osteoarthritis   . Atrial flutter     s/p ablation  . Hip fracture   . DEMENTIA   . Hypertension   . Macular degeneration disease   . Prostate cancer    Past Surgical History:  Past Surgical History  Procedure Laterality Date  . Appendectomy    . Cholecystectomy    . Tonsillectomy     HPI:  80 y.o. male with a past medical history of atrial fibrillation, HTN, AAA, COPD, prostate cancer, history of a CVA, history of dementia, who lives with his daughter. Cough over past few weeks MD note documented fever up to 103F.   Pt. admitted after fall with resultant right hip fractureHistory of dysphagia and used to be on a special diet, but not anymore according to the daughter. Pt. has had 4 previous MBS' with Dys 3, nectar for past 3 (09/18/10). CXR 5/17 revealed interval worsening aeration at the left base compatible with developing left lower lobe atelectasis. Superimposed airspace consolidation from infection or recent aspiration is not excluded.  Chest CT revealed soft tissue effaces the left lower lobe bronchus suggesting mucus plugging/aspiration, with bronchial wall thickening/bronchitis within the left upper lobe and lingula.    Assessment / Plan / Recommendation Clinical Impression  Pt.'s chart reviewed (history of dysphagia and 4 prior MBS') and SLP discussed possible MBS today today to objectively evaluate swallow function and pt. agreeable.  Received page from xray reporting pt.'s daughter did not want MBS performed  and SLP went to room.  Pt.'s daughter is willing to have MBS done but not presently due to his lethargy/pain meds and probable poor outcome.  SLP educated daughter on pt.'s high aspiration risk which daughter agrees and reported difficutly with pill several weeks ago.  SLP provided oral hygiene for xerostomia due to mouth breathing and venti mask.  Pt. should remain NPO and SLP will return next date for possible objective assessment.     Aspiration Risk  Severe    Diet Recommendation NPO        Other  Recommendations Recommended Consults: MBS Oral Care Recommendations: Oral care BID   Follow Up Recommendations   (TBD)    Frequency and Duration min 2x/week  2 weeks   Pertinent Vitals/Pain WDL        Swallow Study          Oral/Motor/Sensory Function Overall Oral Motor/Sensory Function:  (decreased lingual/labial ROM)   Ice Chips Ice chips: Not tested   Thin Liquid Thin Liquid: Not tested    Nectar Thick Nectar Thick Liquid: Not tested   Honey Thick Honey Thick Liquid: Not tested   Puree Puree: Not tested   Solid   GO    Solid: Not tested       [name] M.[employer] [phone]  08/02/2013

## 2013-08-02 NOTE — ED Provider Notes (Signed)
I saw and evaluated the patient, reviewed the resident's note and I agree with the findings and plan.   EKG Interpretation   Date/Time:  Saturday Jul 31 2013 18:35:40 EDT Ventricular Rate:  91 PR Interval:    QRS Duration: 96 QT Interval:  354 QTC Calculation: 435 R Axis:   -156 Text Interpretation:  ** Suspect arm lead reversal, interpretation  assumes no reversal Atrial fibrillation Low voltage QRS Septal infarct ,  age undetermined Lateral infarct , age undetermined Abnormal ECG No  significant change was found Confirmed by [facility]  MD, TREY ([id number]) on  08/01/2013 12:13:00 AM        [name] III, MD 08/02/13 1150

## 2013-08-02 NOTE — Progress Notes (Signed)
Clinical Social Work Department BRIEF PSYCHOSOCIAL ASSESSMENT 08/02/2013  Patient:  [name], [name]     Account Number:  [id number]     Admit date:  08/01/2013  Clinical Social Worker:  Freeman Caldron  Date/Time:  08/02/2013 03:07 PM  Referred by:  Physician  Date Referred:  08/02/2013 Referred for  SNF Placement   Other Referral:   Interview type:  Family Other interview type:    PSYCHOSOCIAL DATA Living Status:  FAMILY Admitted from facility:   Level of care:   Primary support name:  Anda Kraft [phone]) Primary support relationship to patient:  CHILD, ADULT Degree of support available:   Good--pt lives with daughter and son-in-law.    CURRENT CONCERNS Current Concerns  Post-Acute Placement   Other Concerns:    SOCIAL WORK ASSESSMENT / PLAN CSW spoke extensively with pt's daughter at bedside. Daughter states pt was living with her before hospitalization. Daughter believes she may need to seek long-term placement for pt at this point. He has been to [facility] in the past. Daughter and her husband are addressing outstanding payment at [employer] today. Pt to have surgery tomorrow, and daughter expressed concern that pt may not survive surgery. CSW provided support as daughter described fears. Made referrals to [facility] and [facility].   Assessment/plan status:  Psychosocial Support/Ongoing Assessment of Needs Other assessment/ plan:   Information/referral to community resources:   SNF    PATIENT'S/FAMILY'S RESPONSE TO PLAN OF CARE: Good--pt's daughter engaged in conversation with CSW. Pt not oriented x4 and resting in bed during assessment. Pt understanding of CSW role and CSW will follow up to provide support and assist with discharge planning as needed.       Ky Barban, MSW, [facility] Clinical Social Worker [phone]

## 2013-08-02 NOTE — Progress Notes (Signed)
INITIAL NUTRITION ASSESSMENT  DOCUMENTATION CODES Per approved criteria  -Severe malnutrition in the context of chronic illness -Underweight   INTERVENTION: Advance diet as medically appropriate RD to follow for nutrition care plan, add supplements accordingly  NUTRITION DIAGNOSIS: Increased nutrient needs related to hip fracture as evidenced by estimated nutrition needs  Goal: Pt to meet >/= 90% of their estimated nutrition needs   Monitor:  PO & supplemental intake, weight, labs, I/O's  Reason for Assessment: Hip Fracture Protocol  80 y.o. male  Admitting Dx: Hip fracture, right  ASSESSMENT: 80 y.o. male with a PMH of atrial fibrillation, prostate cancer, CVA and dementia.  Patient s/p fall at home. EMS was called and the patient was brought into the hospital.  X-ray showed IT hip fracture.  Patient on venturi-mask upon RD visit; spoke with patient's daughter at bedside; reports patient usually consumes 6 small meals per day; she states with his Alzheimer's dementia sometimes he forgets to eat and at times he will want to eat something for example at 4 AM; + visible severe muscle loss to upper body; no significant weight loss per wt readings below; patient does enjoy Strawberry Ensure supplements -- will order when/as able.  RD re-weighed patient on bed scale at 132 lbs.  Nutrition Focused Physical Exam:  Subcutaneous Fat:  Orbital Region: N/A Upper Arm Region: severe depletion Thoracic and Lumbar Region: N/A  Muscle: Temple Region: severe depletion Clavicle Bone Region: severe depletion Clavicle and Acromion Bone Region: severe depletion Scapular Bone Region: N/A Dorsal Hand: N/A Patellar Region: N/A Anterior Thigh Region: N/A Posterior Calf Region: N/A  Edema: none  Patient meets criteria for severe malnutrition in the context of chronic illness as evidenced by severe muscle loss and severe subcutaneous fat loss.  Height: Ht Readings from Last 1 Encounters:   08/01/13 6\' 2"  (1.88 m)    Weight: Wt Readings from Last 1 Encounters:  08/02/13 124 lb 12.5 oz (56.6 kg)    Ideal Body Weight: 190 lb  % Ideal Body Weight: 65%  Wt Readings from Last 10 Encounters:  08/02/13 124 lb 12.5 oz (56.6 kg)  07/31/13 142 lb (64.411 kg)  01/31/13 144 lb (65.318 kg)  08/11/12 138 lb (62.596 kg)  06/08/12 138 lb (62.596 kg)  03/03/12 142 lb (64.411 kg)  11/12/11 140 lb (63.504 kg)  07/17/11 130 lb (58.968 kg)  04/18/11 138 lb 4 oz (62.71 kg)  03/06/11 127 lb (57.607 kg)  08/02/12 132 lbs (RD re-weighed)  Usual Body Weight: 144 lb -- November 2014  % Usual Body Weight:   BMI:  16.9 kg/m2 (using 132 lbs)  Estimated Nutritional Needs: Kcal: 1600-1800 Protein: 80-90 gm Fluid: 1.6-1.8 L  Skin: Intact  Diet Order: NPO  EDUCATION NEEDS: -No education needs identified at this time   Intake/Output Summary (Last 24 hours) at 08/02/13 1153 Last data filed at 08/02/13 1102  Gross per 24 hour  Intake    188 ml  Output      0 ml  Net    188 ml    Labs:   Recent Labs Lab 07/31/13 1842 08/01/13 1554 08/02/13 0450  NA 140 137 137  K 4.1 4.0 4.2  CL 99 99 99  CO2 27 25 25   BUN 12 16 14   CREATININE 0.83 1.02 0.82  CALCIUM 9.7 9.2 9.0  GLUCOSE 112* 122* 161*    Scheduled Meds: . levofloxacin (LEVAQUIN) IV  500 mg Intravenous Daily  . sodium chloride  3 mL Intravenous Q12H  Continuous Infusions: . dextrose 5 % and 0.45% NaCl 40 mL/hr at 08/02/13 0800    Past Medical History  Diagnosis Date  . Atrial fibrillation   . AAA (abdominal aortic aneurysm)   . Carotid stenosis   . Hypercholesteremia   . Cardiomyopathy     improved  . Renal artery stenosis   . COPD (chronic obstructive pulmonary disease)   . Osteoarthritis   . Atrial flutter     s/p ablation  . Hip fracture   . DEMENTIA   . Hypertension   . Macular degeneration disease   . Prostate cancer     Past Surgical History  Procedure Laterality Date  .  Appendectomy    . Cholecystectomy    . Tonsillectomy      Arthur Holms, RD, LDN Pager #: [phone] After-Hours Pager #: [phone]

## 2013-08-02 NOTE — Progress Notes (Signed)
Utilization Review Completed.  

## 2013-08-03 ENCOUNTER — Inpatient Hospital Stay (HOSPITAL_COMMUNITY): Payer: Medicare Other

## 2013-08-03 ENCOUNTER — Encounter (HOSPITAL_COMMUNITY)
Admission: EM | Disposition: A | Discharge status: Skilled Nursing Facility | Payer: Self-pay | Source: Home / Self Care | Attending: Internal Medicine

## 2013-08-03 ENCOUNTER — Inpatient Hospital Stay (HOSPITAL_COMMUNITY): Payer: Medicare Other | Admitting: Certified Registered Nurse Anesthetist

## 2013-08-03 ENCOUNTER — Encounter (HOSPITAL_COMMUNITY): Payer: Self-pay | Admitting: Anesthesiology

## 2013-08-03 ENCOUNTER — Encounter (HOSPITAL_COMMUNITY): Payer: Medicare Other | Admitting: Certified Registered Nurse Anesthetist

## 2013-08-03 DIAGNOSIS — J9819 Other pulmonary collapse: Secondary | ICD-10-CM

## 2013-08-03 DIAGNOSIS — G9341 Metabolic encephalopathy: Secondary | ICD-10-CM

## 2013-08-03 DIAGNOSIS — J9600 Acute respiratory failure, unspecified whether with hypoxia or hypercapnia: Secondary | ICD-10-CM

## 2013-08-03 DIAGNOSIS — I10 Essential (primary) hypertension: Secondary | ICD-10-CM

## 2013-08-03 DIAGNOSIS — I959 Hypotension, unspecified: Secondary | ICD-10-CM

## 2013-08-03 DIAGNOSIS — I714 Abdominal aortic aneurysm, without rupture, unspecified: Secondary | ICD-10-CM

## 2013-08-03 DIAGNOSIS — Z8546 Personal history of malignant neoplasm of prostate: Secondary | ICD-10-CM

## 2013-08-03 HISTORY — PX: INTRAMEDULLARY (IM) NAIL INTERTROCHANTERIC: SHX5875

## 2013-08-03 LAB — LACTIC ACID, PLASMA: Lactic Acid, Venous: 1.2 mmol/L (ref 0.5–2.2)

## 2013-08-03 LAB — BLOOD GAS, ARTERIAL
Acid-base deficit: 2.2 mmol/L — ABNORMAL HIGH (ref 0.0–2.0)
BICARBONATE: 22.7 meq/L (ref 20.0–24.0)
Drawn by: 362771
FIO2: 0.5 %
MECHVT: 450 mL
O2 Saturation: 96.2 %
PCO2 ART: 43.3 mmHg (ref 35.0–45.0)
PEEP/CPAP: 5 cmH2O
PH ART: 7.339 — AB (ref 7.350–7.450)
Patient temperature: 98.6
RATE: 14 resp/min
TCO2: 24 mmol/L (ref 0–100)
pO2, Arterial: 98.7 mmHg (ref 80.0–100.0)

## 2013-08-03 LAB — BASIC METABOLIC PANEL
BUN: 16 mg/dL (ref 6–23)
BUN: 15 mg/dL (ref 6–23)
CHLORIDE: 105 meq/L (ref 96–112)
CHLORIDE: 103 meq/L (ref 96–112)
CO2: 25 mEq/L (ref 19–32)
CO2: 24 mEq/L (ref 19–32)
CREATININE: 0.54 mg/dL (ref 0.50–1.35)
Calcium: 8.7 mg/dL (ref 8.4–10.5)
Calcium: 8 mg/dL — ABNORMAL LOW (ref 8.4–10.5)
Creatinine, Ser: 0.69 mg/dL (ref 0.50–1.35)
GFR calc non Af Amer: >90 mL/min (ref >90)
GFR, EST NON AFRICAN AMERICAN: 83 mL/min — AB (ref >90)
GLUCOSE: 140 mg/dL — AB (ref 70–99)
Glucose, Bld: 113 mg/dL — ABNORMAL HIGH (ref 70–99)
Potassium: 4.1 mEq/L (ref 3.7–5.3)
Potassium: 4.4 mEq/L (ref 3.7–5.3)
Sodium: 141 mEq/L (ref 137–147)
Sodium: 138 mEq/L (ref 137–147)

## 2013-08-03 LAB — GLUCOSE, CAPILLARY
Glucose-Capillary: 126 mg/dL — ABNORMAL HIGH (ref 70–99)
Glucose-Capillary: 133 mg/dL — ABNORMAL HIGH (ref 70–99)

## 2013-08-03 LAB — URINALYSIS, ROUTINE W REFLEX MICROSCOPIC
Bilirubin Urine: NEGATIVE
GLUCOSE, UA: NEGATIVE mg/dL
HGB URINE DIPSTICK: NEGATIVE
Ketones, ur: 15 mg/dL — AB
Leukocytes, UA: NEGATIVE
Nitrite: NEGATIVE
PH: 5.5 (ref 5.0–8.0)
Protein, ur: 30 mg/dL — AB
SPECIFIC GRAVITY, URINE: 1.038 — AB (ref 1.005–1.030)
UROBILINOGEN UA: 1 mg/dL (ref 0.0–1.0)

## 2013-08-03 LAB — CBC
HEMATOCRIT: 37.2 % — AB (ref 39.0–52.0)
HEMATOCRIT: 31.4 % — AB (ref 39.0–52.0)
Hemoglobin: 12.3 g/dL — ABNORMAL LOW (ref 13.0–17.0)
Hemoglobin: 10.4 g/dL — ABNORMAL LOW (ref 13.0–17.0)
MCH: 31.1 pg (ref 26.0–34.0)
MCH: 31 pg (ref 26.0–34.0)
MCHC: 33.1 g/dL (ref 30.0–36.0)
MCHC: 33.1 g/dL (ref 30.0–36.0)
MCV: 93.9 fL (ref 78.0–100.0)
MCV: 93.5 fL (ref 78.0–100.0)
PLATELETS: 163 10*3/uL (ref 150–400)
Platelets: 146 10*3/uL — ABNORMAL LOW (ref 150–400)
RBC: 3.96 MIL/uL — ABNORMAL LOW (ref 4.22–5.81)
RBC: 3.36 MIL/uL — AB (ref 4.22–5.81)
RDW: 14.4 % (ref 11.5–15.5)
RDW: 14.3 % (ref 11.5–15.5)
WBC: 14.3 10*3/uL — AB (ref 4.0–10.5)
WBC: 13.6 10*3/uL — ABNORMAL HIGH (ref 4.0–10.5)

## 2013-08-03 LAB — URINE MICROSCOPIC-ADD ON

## 2013-08-03 LAB — TYPE AND SCREEN
ABO/RH(D): O POS
ANTIBODY SCREEN: NEGATIVE

## 2013-08-03 LAB — PROCALCITONIN

## 2013-08-03 SURGERY — FIXATION, FRACTURE, INTERTROCHANTERIC, WITH INTRAMEDULLARY ROD
Anesthesia: General | Site: Hip | Laterality: Right

## 2013-08-03 MED ORDER — DILTIAZEM HCL 100 MG IV SOLR
0.0000 mg/h | INTRAVENOUS | Status: DC
  Administered 2013-08-03: 5 mg/h via INTRAVENOUS
  Filled 2013-08-03: qty 100

## 2013-08-03 MED ORDER — METOPROLOL TARTRATE 1 MG/ML IV SOLN
2.5000 mg | INTRAVENOUS | Status: DC | PRN
  Administered 2013-08-03 – 2013-08-04 (×2): 5 mg via INTRAVENOUS
  Administered 2013-08-04: 2.5 mg via INTRAVENOUS
  Administered 2013-08-04 – 2013-08-05 (×4): 5 mg via INTRAVENOUS
  Administered 2013-08-06 (×3): 2.5 mg via INTRAVENOUS
  Filled 2013-08-03 (×5): qty 5

## 2013-08-03 MED ORDER — FENTANYL BOLUS VIA INFUSION
25.0000 ug | INTRAVENOUS | Status: DC | PRN
  Filled 2013-08-03: qty 50

## 2013-08-03 MED ORDER — ARTIFICIAL TEARS OP OINT
TOPICAL_OINTMENT | OPHTHALMIC | Status: DC | PRN
  Administered 2013-08-03: 1 via OPHTHALMIC

## 2013-08-03 MED ORDER — ROCURONIUM BROMIDE 50 MG/5ML IV SOLN
INTRAVENOUS | Status: AC
  Filled 2013-08-03: qty 1

## 2013-08-03 MED ORDER — PROPOFOL 10 MG/ML IV BOLUS
INTRAVENOUS | Status: DC | PRN
  Administered 2013-08-03: 90 mg via INTRAVENOUS

## 2013-08-03 MED ORDER — PROPOFOL 10 MG/ML IV EMUL
INTRAVENOUS | Status: AC
  Filled 2013-08-03: qty 100

## 2013-08-03 MED ORDER — TRAMADOL-ACETAMINOPHEN 37.5-325 MG PO TABS: 1.0000 | ORAL_TABLET | Freq: Four times a day (QID) | ORAL | Status: DC | PRN

## 2013-08-03 MED ORDER — PHENOL 1.4 % MT LIQD
1.0000 | OROMUCOSAL | Status: DC | PRN
  Filled 2013-08-03: qty 177

## 2013-08-03 MED ORDER — 0.9 % SODIUM CHLORIDE (POUR BTL) OPTIME
TOPICAL | Status: DC | PRN
  Administered 2013-08-03: 1000 mL

## 2013-08-03 MED ORDER — SUCCINYLCHOLINE CHLORIDE 20 MG/ML IJ SOLN
INTRAMUSCULAR | Status: DC | PRN
  Administered 2013-08-03: 100 mg via INTRAVENOUS

## 2013-08-03 MED ORDER — PHENYLEPHRINE HCL 10 MG/ML IJ SOLN
10.0000 mg | INTRAVENOUS | Status: DC | PRN
  Administered 2013-08-03: 20 ug/min via INTRAVENOUS

## 2013-08-03 MED ORDER — MENTHOL 3 MG MT LOZG
1.0000 | LOZENGE | OROMUCOSAL | Status: DC | PRN
  Filled 2013-08-03: qty 9

## 2013-08-03 MED ORDER — LIDOCAINE HCL (CARDIAC) 20 MG/ML IV SOLN
INTRAVENOUS | Status: DC | PRN
Start: 2013-08-03 — End: 2013-08-03
  Administered 2013-08-03: 50 mg via INTRAVENOUS

## 2013-08-03 MED ORDER — ENOXAPARIN SODIUM 40 MG/0.4ML ~~LOC~~ SOLN
40.0000 mg | SUBCUTANEOUS | Status: DC
  Administered 2013-08-04 – 2013-08-13 (×10): 40 mg via SUBCUTANEOUS
  Filled 2013-08-03 (×10): qty 0.4

## 2013-08-03 MED ORDER — ONDANSETRON HCL 4 MG/2ML IJ SOLN: 4.0000 mg | Freq: Once | INTRAMUSCULAR | Status: DC | PRN

## 2013-08-03 MED ORDER — PHENYLEPHRINE HCL 10 MG/ML IJ SOLN
30.0000 ug/min | INTRAVENOUS | Status: DC
  Administered 2013-08-04: 50 ug/min via INTRAVENOUS
  Administered 2013-08-04: 30 ug/min via INTRAVENOUS
  Filled 2013-08-03: qty 1

## 2013-08-03 MED ORDER — DILTIAZEM LOAD VIA INFUSION
10.0000 mg | Freq: Once | INTRAVENOUS | Status: AC
  Administered 2013-08-03: 10 mg via INTRAVENOUS
  Filled 2013-08-03: qty 10

## 2013-08-03 MED ORDER — NEOSTIGMINE METHYLSULFATE 10 MG/10ML IV SOLN
INTRAVENOUS | Status: AC
  Filled 2013-08-03: qty 1

## 2013-08-03 MED ORDER — EPHEDRINE SULFATE 50 MG/ML IJ SOLN
INTRAMUSCULAR | Status: AC
  Filled 2013-08-03: qty 1

## 2013-08-03 MED ORDER — GLYCOPYRROLATE 0.2 MG/ML IJ SOLN
INTRAMUSCULAR | Status: AC
  Filled 2013-08-03: qty 2

## 2013-08-03 MED ORDER — METOCLOPRAMIDE HCL 5 MG/ML IJ SOLN
5.0000 mg | Freq: Three times a day (TID) | INTRAMUSCULAR | Status: DC | PRN
  Filled 2013-08-03: qty 2

## 2013-08-03 MED ORDER — BIOTENE DRY MOUTH MT LIQD
15.0000 mL | Freq: Four times a day (QID) | OROMUCOSAL | Status: DC
  Administered 2013-08-04 – 2013-08-13 (×34): 15 mL via OROMUCOSAL

## 2013-08-03 MED ORDER — DOCUSATE SODIUM 100 MG PO CAPS
100.0000 mg | ORAL_CAPSULE | Freq: Two times a day (BID) | ORAL | Status: DC
  Filled 2013-08-03: qty 1

## 2013-08-03 MED ORDER — LACTATED RINGERS IV SOLN
INTRAVENOUS | Status: DC | PRN
  Administered 2013-08-03: 17:00:00 via INTRAVENOUS

## 2013-08-03 MED ORDER — LACTATED RINGERS IV SOLN
INTRAVENOUS | Status: DC | PRN
  Administered 2013-08-03: 16:00:00 via INTRAVENOUS

## 2013-08-03 MED ORDER — MORPHINE SULFATE 2 MG/ML IJ SOLN: 0.5000 mg | INTRAMUSCULAR | Status: DC | PRN

## 2013-08-03 MED ORDER — ONDANSETRON HCL 4 MG/2ML IJ SOLN: 4.0000 mg | Freq: Four times a day (QID) | INTRAMUSCULAR | Status: DC | PRN

## 2013-08-03 MED ORDER — PANTOPRAZOLE SODIUM 40 MG IV SOLR
40.0000 mg | Freq: Every day | INTRAVENOUS | Status: DC
  Administered 2013-08-03 – 2013-08-04 (×2): 40 mg via INTRAVENOUS
  Filled 2013-08-03 (×3): qty 40

## 2013-08-03 MED ORDER — ONDANSETRON HCL 4 MG/2ML IJ SOLN
INTRAMUSCULAR | Status: AC
  Filled 2013-08-03: qty 2

## 2013-08-03 MED ORDER — ONDANSETRON HCL 4 MG PO TABS: 4.0000 mg | ORAL_TABLET | Freq: Four times a day (QID) | ORAL | Status: DC | PRN

## 2013-08-03 MED ORDER — SUCCINYLCHOLINE CHLORIDE 20 MG/ML IJ SOLN
INTRAMUSCULAR | Status: AC
  Filled 2013-08-03: qty 1

## 2013-08-03 MED ORDER — SODIUM CHLORIDE 0.9 % IV SOLN
INTRAVENOUS | Status: DC
  Filled 2013-08-03: qty 1000

## 2013-08-03 MED ORDER — PROPOFOL INFUSION 10 MG/ML OPTIME
INTRAVENOUS | Status: DC | PRN
  Administered 2013-08-03: 50 ug/kg/min via INTRAVENOUS

## 2013-08-03 MED ORDER — HYDROMORPHONE HCL PF 1 MG/ML IJ SOLN: 0.2500 mg | INTRAMUSCULAR | Status: DC | PRN

## 2013-08-03 MED ORDER — CHLORHEXIDINE GLUCONATE 0.12 % MT SOLN
15.0000 mL | Freq: Two times a day (BID) | OROMUCOSAL | Status: DC
  Administered 2013-08-03 – 2013-08-13 (×19): 15 mL via OROMUCOSAL
  Filled 2013-08-03 (×22): qty 15

## 2013-08-03 MED ORDER — INSULIN ASPART 100 UNIT/ML ~~LOC~~ SOLN
1.0000 [IU] | SUBCUTANEOUS | Status: DC
  Administered 2013-08-03 – 2013-08-04 (×4): 1 [IU] via SUBCUTANEOUS

## 2013-08-03 MED ORDER — SODIUM CHLORIDE 0.9 % IV BOLUS (SEPSIS)
500.0000 mL | Freq: Once | INTRAVENOUS | Status: AC
  Administered 2013-08-03: 500 mL via INTRAVENOUS

## 2013-08-03 MED ORDER — METOCLOPRAMIDE HCL 10 MG PO TABS: 5.0000 mg | ORAL_TABLET | Freq: Three times a day (TID) | ORAL | Status: DC | PRN

## 2013-08-03 MED ORDER — SODIUM CHLORIDE 0.9 % IV SOLN
0.0000 ug/h | INTRAVENOUS | Status: DC
  Administered 2013-08-03: 50 ug/h via INTRAVENOUS
  Filled 2013-08-03: qty 50

## 2013-08-03 MED ORDER — HYDROCODONE-ACETAMINOPHEN 5-325 MG PO TABS: 1.0000 | ORAL_TABLET | Freq: Four times a day (QID) | ORAL | Status: DC | PRN

## 2013-08-03 MED ORDER — PIPERACILLIN-TAZOBACTAM 3.375 G IVPB
3.3750 g | Freq: Three times a day (TID) | INTRAVENOUS | Status: DC
  Administered 2013-08-03 – 2013-08-04 (×2): 3.375 g via INTRAVENOUS
  Filled 2013-08-03 (×4): qty 50

## 2013-08-03 MED ORDER — PROPOFOL 10 MG/ML IV BOLUS
INTRAVENOUS | Status: AC
  Filled 2013-08-03: qty 20

## 2013-08-03 MED ORDER — ARTIFICIAL TEARS OP OINT
TOPICAL_OINTMENT | OPHTHALMIC | Status: AC
  Filled 2013-08-03: qty 3.5

## 2013-08-03 MED ORDER — ROCURONIUM BROMIDE 100 MG/10ML IV SOLN
INTRAVENOUS | Status: DC | PRN
  Administered 2013-08-03: 35 mg via INTRAVENOUS
  Administered 2013-08-03: 15 mg via INTRAVENOUS

## 2013-08-03 MED ORDER — FENTANYL CITRATE 0.05 MG/ML IJ SOLN
INTRAMUSCULAR | Status: AC
  Filled 2013-08-03: qty 5

## 2013-08-03 MED ORDER — ACETAMINOPHEN 650 MG RE SUPP: 650.0000 mg | Freq: Four times a day (QID) | RECTAL | Status: DC | PRN

## 2013-08-03 MED ORDER — ACETAMINOPHEN 325 MG PO TABS: 650.0000 mg | ORAL_TABLET | Freq: Four times a day (QID) | ORAL | Status: DC | PRN

## 2013-08-03 MED ORDER — FENTANYL CITRATE 0.05 MG/ML IJ SOLN
INTRAMUSCULAR | Status: DC | PRN
  Administered 2013-08-03 (×2): 50 ug via INTRAVENOUS
  Administered 2013-08-03: 100 ug via INTRAVENOUS
  Administered 2013-08-03: 50 ug via INTRAVENOUS

## 2013-08-03 MED ORDER — FENTANYL CITRATE 0.05 MG/ML IJ SOLN: 50.0000 ug | Freq: Once | INTRAMUSCULAR | Status: DC

## 2013-08-03 MED ORDER — POLYETHYLENE GLYCOL 3350 17 G PO PACK
17.0000 g | PACK | Freq: Every day | ORAL | Status: DC | PRN
  Filled 2013-08-03: qty 1

## 2013-08-03 MED ORDER — PHENYLEPHRINE 40 MCG/ML (10ML) SYRINGE FOR IV PUSH (FOR BLOOD PRESSURE SUPPORT)
PREFILLED_SYRINGE | INTRAVENOUS | Status: AC
  Filled 2013-08-03: qty 10

## 2013-08-03 SURGICAL SUPPLY — 47 items
APL SKNCLS STERI-STRIP NONHPOA (GAUZE/BANDAGES/DRESSINGS)
BANDAGE GAUZE ELAST BULKY 4 IN (GAUZE/BANDAGES/DRESSINGS) ×2 IMPLANT
BENZOIN TINCTURE PRP APPL 2/3 (GAUZE/BANDAGES/DRESSINGS) ×1 IMPLANT
BIT DRILL CANN LG 4.3MM (BIT) IMPLANT
BOOTCOVER CLEANROOM LRG (PROTECTIVE WEAR) ×6 IMPLANT
CLOSURE WOUND 1/2 X4 (GAUZE/BANDAGES/DRESSINGS)
COVER PERINEAL POST (MISCELLANEOUS) ×3 IMPLANT
COVER SURGICAL LIGHT HANDLE (MISCELLANEOUS) ×3 IMPLANT
DRAPE STERI IOBAN 125X83 (DRAPES) ×3 IMPLANT
DRILL BIT CANN LG 4.3MM (BIT) ×3
DRSG MEPILEX BORDER 4X12 (GAUZE/BANDAGES/DRESSINGS) ×2 IMPLANT
DRSG MEPILEX BORDER 4X4 (GAUZE/BANDAGES/DRESSINGS) ×1 IMPLANT
DRSG MEPILEX BORDER 4X8 (GAUZE/BANDAGES/DRESSINGS) ×2 IMPLANT
DURAPREP 26ML APPLICATOR (WOUND CARE) ×3 IMPLANT
ELECT CAUTERY BLADE 6.4 (BLADE) ×3 IMPLANT
ELECT REM PT RETURN 9FT ADLT (ELECTROSURGICAL)
ELECTRODE REM PT RTRN 9FT ADLT (ELECTROSURGICAL) ×1 IMPLANT
EVACUATOR 1/8 PVC DRAIN (DRAIN) IMPLANT
FACESHIELD WRAPAROUND (MASK) IMPLANT
FACESHIELD WRAPAROUND OR TEAM (MASK) ×2 IMPLANT
GAUZE XEROFORM 5X9 LF (GAUZE/BANDAGES/DRESSINGS) ×1 IMPLANT
GLOVE BIOGEL PI IND STRL 7.5 (GLOVE) IMPLANT
GLOVE BIOGEL PI INDICATOR 7.5 (GLOVE) ×2
GUIDEPIN 3.2X17.5 THRD DISP (PIN) ×4 IMPLANT
HIP FR NAIL LAG SCREW 10.5X110 (Orthopedic Implant) ×3 IMPLANT
KIT ROOM TURNOVER OR (KITS) ×3 IMPLANT
LINER BOOT UNIVERSAL DISP (MISCELLANEOUS) ×3 IMPLANT
MANIFOLD NEPTUNE II (INSTRUMENTS) ×3 IMPLANT
NAIL HIP FRACT 130D 11X180 (Screw) ×2 IMPLANT
NS IRRIG 1000ML POUR BTL (IV SOLUTION) ×3 IMPLANT
PACK GENERAL/GYN (CUSTOM PROCEDURE TRAY) ×3 IMPLANT
PAD ARMBOARD 7.5X6 YLW CONV (MISCELLANEOUS) ×6 IMPLANT
SCREW BONE CORTICAL 5.0X42 (Screw) ×2 IMPLANT
SCREW LAG HIP FR NAIL 10.5X110 (Orthopedic Implant) IMPLANT
STAPLER VISISTAT 35W (STAPLE) ×3 IMPLANT
STRIP CLOSURE SKIN 1/2X4 (GAUZE/BANDAGES/DRESSINGS) ×1 IMPLANT
SUT VIC AB 0 CTB1 27 (SUTURE) ×1 IMPLANT
SUT VIC AB 2-0 CT1 27 (SUTURE) ×3
SUT VIC AB 2-0 CT1 TAPERPNT 27 (SUTURE) IMPLANT
SUT VIC AB 2-0 FS1 27 (SUTURE) ×1 IMPLANT
SUT VIC AB 2-0 SH 27 (SUTURE)
SUT VIC AB 2-0 SH 27XBRD (SUTURE) IMPLANT
SUT VIC AB 3-0 SH 8-18 (SUTURE) ×1 IMPLANT
TOWEL OR 17X24 6PK STRL BLUE (TOWEL DISPOSABLE) ×3 IMPLANT
TOWEL OR 17X26 10 PK STRL BLUE (TOWEL DISPOSABLE) ×3 IMPLANT
TRAY FOLEY CATH 16FRSI W/METER (SET/KITS/TRAYS/PACK) ×2 IMPLANT
WATER STERILE IRR 1000ML POUR (IV SOLUTION) ×3 IMPLANT

## 2013-08-03 NOTE — Consult Note (Signed)
PULMONARY / CRITICAL CARE MEDICINE   Name: [name] MRN: [id number] DOB: 1924/02/25    ADMISSION DATE:  08/01/2013 CONSULTATION DATE:  08/03/2013  REFERRING MD :  Sherral Hammers PRIMARY SERVICE: PCCM  CHIEF COMPLAINT:  Shock  BRIEF PATIENT DESCRIPTION: 80 year old male, former smoker,  with PAF and dementia fell at home 5/17. In ED he was found to have R hip fracture and presumed URI. He was admitted and underwent ORIF of R hip 5/19. Postoperatively he remains intubated and has been transferred to ICU, PCCM to manage ICU care.   SIGNIFICANT EVENTS / STUDIES:  5/17 - CT spine > No acute fracture, No acute intracranial abnormality, Cervical spondylosis without fracture 5/17 - R hip XR > Acute right intertrochanteric fracture, with separate lesser trochanteric fragment. 5/17 - CT abd/pelvis -  intertrochanteric right hip fracture, Superior pubic ramus fracture on the right, Suspicious for Ileus. Slight increase in size of infrarenal AAA 5/18 - CTA chest >Negative for PE, nodule LLL, "severe COPD" 5/19 - ORIF R hip - On vent in ICU post-op  LINES / TUBES: OETT 5/19 >>> RIJ CVL 5/19 >>>  CULTURES: none   ANTIBIOTICS: levaquin 5/17-5/19 zosyn 5/19 >>>  HISTORY OF PRESENT ILLNESS:  80 y.o. male with a past medical history of atrial fibrillation (not on anticoagulation due to bleeding issues in the past), prostate cancer, CVA, dementia, who lives with his daughter.  According to the daughter he was sitting at the side of the bed and was feeling thirsty. She turned around to go to the kitchen and had walked a few steps and she heard a crashing sound. She immediately come back to his room and he was lying on the floor. He was awake. Apparently, there was no syncopal episode. He complained of pain in the right hip area. EMS was called and the patient was brought into the hospital. Over the past couple of weeks patient has been having cough and yesterday he had a fever up to 103F. Family states cough  was precipitated by an aspiration episode. He was brought into the emergency department. Chest x-ray was inconclusive, but based on clinical presentation he was thought to have some kind of respiratory tract infection and was prescribed Levaquin. He was admitted to the hospital, treated with ABX and underwent ORIF 5/19. He remains intubated and hypotensive in the postoperative period. He has been transferred to the ICU.   PAST MEDICAL HISTORY :  Past Medical History  Diagnosis Date  . Atrial fibrillation   . AAA (abdominal aortic aneurysm)   . Carotid stenosis   . Hypercholesteremia   . Cardiomyopathy     improved  . Renal artery stenosis   . COPD (chronic obstructive pulmonary disease)   . Osteoarthritis   . Atrial flutter     s/p ablation  . Hip fracture   . DEMENTIA   . Hypertension   . Macular degeneration disease   . Prostate cancer    Past Surgical History  Procedure Laterality Date  . Appendectomy    . Cholecystectomy    . Tonsillectomy     Prior to Admission medications   Medication Sig Start Date End Date Taking? Authorizing Provider  aspirin EC 81 MG tablet Take 81 mg by mouth every evening.    Yes Historical Provider, MD  diltiazem (CARDIZEM CD) 180 MG 24 hr capsule Take 180 mg by mouth daily.   Yes Historical Provider, MD  metoprolol (LOPRESSOR) 50 MG tablet Take 50 mg by mouth  2 (two) times daily.   Yes Historical Provider, MD  potassium chloride SA (K-DUR,KLOR-CON) 20 MEQ tablet Take 20 mEq by mouth 2 (two) times daily.   Yes Historical Provider, MD  pravastatin (PRAVACHOL) 40 MG tablet Take 40 mg by mouth at bedtime.    Yes Historical Provider, MD  levofloxacin (LEVAQUIN) 750 MG tablet Take 1 tablet (750 mg total) by mouth daily. X 7 days 08/01/13   Corlis Leak, MD  traMADol-acetaminophen (ULTRACET) 37.5-325 MG per tablet Take 1 tablet by mouth every 6 (six) hours as needed. 08/03/13   Mauri Pole, MD   No Known Allergies  FAMILY HISTORY:  Family History   Problem Relation Age of Onset  . Heart attack Father    SOCIAL HISTORY:  reports that he quit smoking about 12 years ago. His smoking use included Cigarettes. He has a 15 pack-year smoking history. He has quit using smokeless tobacco. He reports that he does not drink alcohol or use illicit drugs.  REVIEW OF SYSTEMS:  Unable as patient is intubated.   SUBJECTIVE:   VITAL SIGNS: Temp:  [97.4 F (36.3 C)-98.4 F (36.9 C)] 97.4 F (36.3 C) (05/19 1848) Pulse Rate:  [64-142] 93 (05/19 2000) Resp:  [14-29] 16 (05/19 2100) BP: (90-149)/(40-85) 90/64 mmHg (05/19 2100) SpO2:  [94 %-100 %] 100 % (05/19 2000) Arterial Line BP: (96-121)/(51-56) 99/51 mmHg (05/19 2100) FiO2 (%):  [50 %] 50 % (05/19 1830) HEMODYNAMICS:   VENTILATOR SETTINGS: Vent Mode:  [-] PRVC FiO2 (%):  [50 %] 50 % Set Rate:  [16 bmp] 16 bmp Vt Set:  [450 mL] 450 mL PEEP:  [5 cmH20] 5 cmH20 Plateau Pressure:  [14 cmH20] 14 cmH20 INTAKE / OUTPUT: Intake/Output     05/19 0701 - 05/20 0700   I.V. (mL/kg) 1180 (20.8)   Total Intake(mL/kg) 1180 (20.8)   Urine (mL/kg/hr) 475 (0.6)   Blood 50 (0.1)   Total Output 525   Net +655         PHYSICAL EXAMINATION: General:  Intubated cachectic male in NAD Neuro:  Sedated on vent. Pupils pinpoint.  HEENT:  [place]/AT, No JVD noted Cardiovascular:  Tachy, irreg irreg Lungs:  Scant rales.  Abdomen:  Soft, non-distended Musculoskeletal:  No acute deformity. Pedal pulses 1+bilateral Skin:  Intact   LABS:  CBC  Recent Labs Lab 08/02/13 0450 08/03/13 0357 08/03/13 2000  WBC 13.0* 14.3* 13.6*  HGB 12.8* 12.3* 10.4*  HCT 38.7* 37.2* 31.4*  PLT 181 163 146*   Coag's  Recent Labs Lab 08/01/13 1554  INR 1.04   BMET  Recent Labs Lab 08/02/13 0450 08/03/13 0357 08/03/13 2000  NA 137 141 138  K 4.2 4.1 4.4  CL 99 105 103  CO2 25 25 24   BUN 14 16 15   CREATININE 0.82 0.69 0.54  GLUCOSE 161* 113* 140*   Electrolytes  Recent Labs Lab 08/02/13 0450  08/03/13 0357 08/03/13 2000  CALCIUM 9.0 8.7 8.0*   Sepsis Markers  Recent Labs Lab 07/31/13 2241 08/03/13 2000  LATICACIDVEN 0.75 1.2   ABG  Recent Labs Lab 08/03/13 2025  PHART 7.339*  PCO2ART 43.3  PO2ART 98.7   Liver Enzymes  Recent Labs Lab 08/01/13 1554 08/02/13 0450  AST 27 31  ALT 22 20  ALKPHOS 132* 124*  BILITOT 0.7 0.9  ALBUMIN 3.7 3.4*   Cardiac Enzymes  Recent Labs Lab 07/31/13 1842 08/01/13 1529  TROPONINI  --  <0.30  PROBNP 1679.0*  --    Glucose  Recent Labs Lab 08/03/13 1843  GLUCAP 126*    Imaging Ct Angio Chest Pe W/cm &/or Wo Cm  08/02/2013   CLINICAL DATA:  Hypoxia, pneumonia.  Fall from bed.  EXAM: CT ANGIOGRAPHY CHEST WITH CONTRAST  TECHNIQUE: Multidetector CT imaging of the chest was performed using the standard protocol during bolus administration of intravenous contrast. Multiplanar CT image reconstructions and MIPs were obtained to evaluate the vascular anatomy.  CONTRAST:  40mL OMNIPAQUE IOHEXOL 350 MG/ML SOLN  COMPARISON:  DG CHEST 1V PORT dated 08/01/2013  FINDINGS: Adequate pulmonary arterial contrast opacification. Main pulmonary artery is mildly enlarged, 3.4 cm in transaxial dimension. No pulmonary arterial filling defects to the subsegmental branches.  Heart is moderately enlarged, with prominent right atrium. Great vessel Coronary artery calcifications. Pericardium is nonsuspicious. Moderate calcific atherosclerosis of the aortic arch and descending aorta which is overall normal in course and caliber. Left hilar abnormal soft tissue likely reflects lymphadenopathy, right calcified hilar lymph nodes.  Severe centrilobular emphysema. Azygos fissure. Multiple subpleural nodules may be benign, measuring up to 6 mm. Scattered calcified granulomas. Air cyst in left lung base and mild interstitial prominence. 6 mm sub solid left lower lobe pulmonary nodule, lateral segment. Bronchial wall thickening, with soft tissue effacing the left  lower lobe pulmonary artery, and debris within the bronchi. Near complete effacement of the lingular and to lesser extent left upper lobe bronchi. No pneumothorax.  Included view of the abdomen demonstrates splenic granulomas. Patient is cachectic. Remote left posterior rib fractures. Chronic appearing mild wedging of vertebral body T3. Osteopenia.  Review of the MIP images confirms the above findings.  IMPRESSION: No acute pulmonary embolism. Enlarged main pulmonary artery suggests pulmonary arterial hypertension in a background of severe COPD.  Soft tissue effaces the left lower lobe bronchus suggesting mucus plugging/aspiration, with bronchial wall thickening/bronchitis within the left upper lobe and lingula. Left lung base atelectasis. Fullness of these soft tissues about the left hilum may reflect lymphadenopathy.  Cardiomegaly without overt findings of failure.  6 mm sub solid pulmonary nodule in left lower lobe in a background of chronic granulomatous changes; recommend follow-up CT in 3 months to confirm persistence.   Electronically Signed   By: Elon Alas   On: 08/02/2013 00:37   Portable Chest Xray  08/03/2013   CLINICAL DATA:  Endotracheal tube and OG tube placement. Acute respiratory failure.  EXAM: PORTABLE CHEST - 1 VIEW 8:44 p.m.  COMPARISON:  08/03/2013 at 4:41 a.m.  FINDINGS: Endotracheal tube is in good position 2 cm above the aortic arch. OG tube tip is below the diaphragm. Right jugular vein catheter tip is in the superior vena cava just above the level of the carina.  Heart size and vascularity are normal. No acute abnormality of the right lung. New atelectasis at the left lung base. Surgical staples in the left lung. Calcified granulomas bilaterally.  IMPRESSION: Central line, ET tube, and OG tube as described. New atelectasis at the left lung base. Chronic lung disease.   Electronically Signed   By: Rozetta Nunnery M.D.   On: 08/03/2013 21:06   Dg Chest Port 1 View  08/03/2013    CLINICAL DATA:  Follow-up of left lower lobe infiltrate.  EXAM: PORTABLE CHEST - 1 VIEW  COMPARISON:  CT ANGIO CHEST W/CM &/OR WO/CM dated 08/01/2013; DG CHEST 1V PORT dated 08/01/2013  FINDINGS: The lungs are well-expanded. Density previously demonstrated in the left lower lobe is less conspicuous on the current exam. The cardiac silhouette is top-normal  in size. The pulmonary vascularity is not engorged. The mediastinum is normal in width. There is evidence of previous granulomatous infection which is stable.  IMPRESSION: There has been improvement in the appearance of the left lower lobe with evidence of resolution of atelectasis. Surgical suture material appears unchanged overlying the lateral costophrenic gutter. There are findings consistent with COPD and previous granulomatous infection.   Electronically Signed   By: Azam Gervasi  [place]   On: 08/03/2013 06:26   Dg Chest Port 1 View  08/01/2013   CLINICAL DATA:  Hypoxia.  Decreased oxygen saturations.  EXAM: PORTABLE CHEST - 1 VIEW  COMPARISON:  Chest x-ray 08/01/2013.  FINDINGS: Emphysematous changes are noted throughout the lungs bilaterally. Multiple calcified granulomas are noted. Compared to the recent prior examinations, there is developing opacification in the left base partially obscuring the left hemidiaphragm, concerning for developing atelectasis and/or consolidation (there is slight right-to-left shift of cardiomediastinal structures, indicative of a significant component of atelectasis). There is mild blunting of the left costophrenic sulcus, which could suggest a small left pleural effusion. No evidence of pulmonary edema. Heart size is upper limits of normal. Mediastinal contours are distorted by leftward shift of cardiomediastinal structures. Atherosclerosis in the thoracic aorta. Surgical clips project over the right upper quadrant of the abdomen, compatible with prior cholecystectomy. There are also suture lines projecting over the lateral aspect  of the left lung base and the left mid to upper lung, suggesting prior wedge resections.  IMPRESSION: 1. Interval worsening aeration at the left base compatible with developing left lower lobe atelectasis. Superimposed airspace consolidation from infection or recent aspiration is not excluded. 2. Postoperative changes, as above. 3. Atherosclerosis. 4. Emphysema.   Electronically Signed   By: Vinnie Langton M.D.   On: 08/01/2013 22:47   Dg Abd Portable 2v  08/02/2013   CLINICAL DATA:  Ileus.  EXAM: PORTABLE ABDOMEN - 2 VIEW  COMPARISON:  CT 08/01/2013.  FINDINGS: Distended loops of small and large bowel are again noted and. No interim change noted from prior CT. No free air. Surgical clips right upper quadrant. Contrast noted in the renal collecting systems and bladder. Mild left base atelectasis and/or infiltrate. Diffuse lumbar spine degenerative change and osteopenia. Aortoiliac atherosclerotic vascular disease. Vascular stent is noted over the left upper abdomen.  IMPRESSION: Findings consistent with persistent unchanged adynamic ileus .   Electronically Signed   By: Marcello Moores  Register   On: 08/02/2013 07:21     ASSESSMENT / PLAN:  PULMONARY A: Acute Respiratory Failure LLL lung nodule Probable aspiration PNA PE ruled out on CTA 5/18  P:   Mechanical ventilatory support F/u CXR SBT in AM, hope to extubate 5/20.  PRN BD's   CARDIOVASCULAR A:  Atrial Fib - RVR - not on chronic coagulation d/t fall risk  Hypotension - suspect this was propofol induced. Considering septic vs cardiogenic etiologies  P:  Stop propofol in favor of fentanyl gtt Hold diltiazem gtt until BP can tolerate Bolus 500cc NS. Neo to keep map > 65 PRn lopressor to keep HR less than 110 Check lactic acid 2D echo Strict I&O Hold home anti-HTN meds (dilt, lopressor)  MUSCULOSKELETAL A:   S/p ORIF R hip fx  P:   Per ortho  RENAL A:   No acute issues  P:   Follow BMP  GASTROINTESTINAL A:    Questionable Ileus  P:   NPO Delay tube feeds for now.   HEMATOLOGIC A:   No acute issues VTE prophylaxis.  P:  SQ enoxaprin - no need to delay per Ortho  INFECTIOUS  A:   SIRS ? Sepsis with potential pulmonary source ? Aspiration PNA CXR is not dignostic, but history would support this.   P:   Change ABX to Zosyn to add anaerobic coverage.  Check PCT Follow WBC and fever curve  ENDOCRINE A:   No known issues   P:   CBG and SSI while intubated  NEUROLOGIC A:   Acute encephalopathy Dementia -Dependant on family at baseline, lives with daughter.   P:   RASS goal: -1 D/c Propofol in favor of fentanyl in setting of hypotension.  WUA in AM with SBT   Georgann Housekeeper, ACNP  Pulmonology/Critical Care Pager [phone] or [phone]   TODAY'S SUMMARY:   I have personally obtained a history, examined the patient, evaluated laboratory and imaging results, formulated the assessment and plan and placed orders. CRITICAL CARE: The patient is critically ill with multiple organ systems failure and requires high complexity decision making for assessment and support, frequent evaluation and titration of therapies, application of advanced monitoring technologies and extensive interpretation of multiple databases. Critical Care Time devoted to patient care services described in this note is 30 minutes.   Merton Border, MD ; [facility] [phone].  After 5:30 PM or weekends, call [phone]

## 2013-08-03 NOTE — Interval H&P Note (Signed)
History and Physical Interval Note:  08/03/2013 4:33 PM  [name]  has presented today for surgery, with the diagnosis of right intertroch fracture  The various methods of treatment have been discussed with the patient and family. After consideration of risks, benefits and other options for treatment, the patient has consented to  Procedure(s): INTRAMEDULLARY (IM) NAIL INTERTROCHANTRIC (Right) as a surgical intervention .  The patient's history has been reviewed, patient examined, no change in status, stable for surgery.  I have reviewed the patient's chart and labs.  Questions were answered to the patient's satisfaction.     Mauri Pole

## 2013-08-03 NOTE — Progress Notes (Signed)
Pharmacy  CONSULT NOTE - INITIAL  Pharmacy Consult for Zosyn and Lovenox Indication: Aspiration pneumonia and VTE prophylaxis  No Known Allergies  Patient Measurements: Height: 6\' 2"  (188 cm) Weight: 124 lb 12.5 oz (56.6 kg) IBW/kg (Calculated) : 82.2 Adjusted Body Weight: 57 kg  Vital Signs: Temp: 97.4 F (36.3 C) (05/19 1848) Temp src: Oral (05/19 1848) BP: 90/64 mmHg (05/19 2100) Pulse Rate: 115 (05/19 2045) Intake/Output from previous day: 05/18 0701 - 05/19 0700 In: 687 [I.V.:687] Out: 525 [Urine:525] Intake/Output from this shift: Total I/O In: 580 [I.V.:580] Out: 300 [Urine:300]  Labs:  Recent Labs  08/02/13 0450 08/03/13 0357 08/03/13 2000  WBC 13.0* 14.3* 13.6*  HGB 12.8* 12.3* 10.4*  PLT 181 163 146*  CREATININE 0.82 0.69 0.54   Estimated Creatinine Clearance: 52.1 ml/min (by C-G formula based on Cr of 0.54). No results found for this basename: VANCOTROUGH, Corlis Leak, VANCORANDOM, [place], GENTPEAK, GENTRANDOM, TOBRATROUGH, TOBRAPEAK, TOBRARND, AMIKACINPEAK, AMIKACINTROU, AMIKACIN,  in the last 72 hours   Microbiology: Recent Results (from the past 720 hour(s))  MRSA PCR SCREENING     Status: None   Collection Time    08/02/13  2:06 AM      Result Value Ref Range Status   MRSA by PCR NEGATIVE  NEGATIVE Final   Comment:            The GeneXpert MRSA Assay (FDA     approved for NASAL specimens     only), is one component of a     comprehensive MRSA colonization     surveillance program. It is not     intended to diagnose MRSA     infection nor to guide or     monitor treatment for     MRSA infections.    Medical History: Past Medical History  Diagnosis Date  . Atrial fibrillation   . AAA (abdominal aortic aneurysm)   . Carotid stenosis   . Hypercholesteremia   . Cardiomyopathy     improved  . Renal artery stenosis   . COPD (chronic obstructive pulmonary disease)   . Osteoarthritis   . Atrial flutter     s/p ablation  . Hip  fracture   . DEMENTIA   . Hypertension   . Macular degeneration disease   . Prostate cancer     Medications:  Scheduled:  . [START ON 08/04/2013] antiseptic oral rinse  15 mL Mouth Rinse QID  . chlorhexidine  15 mL Mouth Rinse BID  . [START ON 08/04/2013] enoxaparin (LOVENOX) injection  40 mg Subcutaneous Q24H  . fentaNYL  50 mcg Intravenous Once  . insulin aspart  1-3 Units Subcutaneous 6 times per day  . pantoprazole (PROTONIX) IV  40 mg Intravenous Daily  . piperacillin-tazobactam (ZOSYN)  IV  3.375 g Intravenous 3 times per day  . sodium chloride  3 mL Intravenous Q12H   Infusions:  . diltiazem (CARDIZEM) infusion Stopped (08/03/13 1919)  . fentaNYL infusion INTRAVENOUS 50 mcg/hr (08/03/13 2009)  . phenylephrine (NEO-SYNEPHRINE) Adult infusion     Assessment: 80 year old man who fell on 5/17 and sustained a right hip fracture, s/p repair today.  He remains intubated post-operatively with a concern for aspiration pneumonia.  Levofloxacin to change to Zosyn.    Also asked to start Lovenox for VTE prophylaxis  Goal of Therapy:  Treat infection and prevent VTE  Plan:  Lovenox 40mg  SQ daily starting tomorrow Zosyn 3.375g IV q8h (infuse over 4 hours) Follow renal function and culture results.  Gearldine Bienenstock Keoshia Steinmetz 08/03/2013,9:52 PM

## 2013-08-03 NOTE — Progress Notes (Signed)
eLink Physician-Brief Progress Note Patient Name: [name] DOB: 1923-04-05 MRN: [id number]  Date of Service  08/03/2013   HPI/Events of Note   Patient arrived in ICU, PCCM not notified Currently intubated, on propofol, in shock on neo and cardizem  eICU Interventions  Stat labs, ekg Stop propofol, stop cardizem Bolus saline 500cc now   Intervention Category Major Interventions: Respiratory failure - evaluation and management  Juanito Doom 08/03/2013, 7:27 PM

## 2013-08-03 NOTE — Progress Notes (Signed)
Subjective: Patient comfortable.  Daughter present.     Objective: Vital signs in last 24 hours: Temp:  [97.4 F (36.3 C)-98 F (36.7 C)] 97.6 F (36.4 C) (05/19 0400) Pulse Rate:  [83-113] 97 (05/19 0400) Resp:  [20-28] 24 (05/19 0400) BP: (129-152)/(70-99) 129/85 mmHg (05/19 0400) SpO2:  [93 %-100 %] 100 % (05/19 0400) FiO2 (%):  [50 %] 50 % (05/19 0400)  Intake/Output from previous day: 05/18 0701 - 05/19 0700 In: 687 [I.V.:687] Out: 525 [Urine:525] Intake/Output this shift:     Recent Labs  07/31/13 1842 08/01/13 1554 08/02/13 0450 08/03/13 0357  HGB 14.2 13.2 12.8* 12.3*    Recent Labs  08/02/13 0450 08/03/13 0357  WBC 13.0* 14.3*  RBC 4.16* 3.96*  HCT 38.7* 37.2*  PLT 181 163    Recent Labs  08/02/13 0450 08/03/13 0357  NA 137 141  K 4.2 4.1  CL 99 105  CO2 25 25  BUN 14 16  CREATININE 0.82 0.69  GLUCOSE 161* 113*  CALCIUM 9.0 8.7    Recent Labs  08/01/13 1554  INR 1.04    Neurologically intact.    Assessment/Plan: Right hip fracture.  Proceed with surgery if medically cleared today.  Dr Olin can post surgery for 1600hrs.   Continue present care.     [name] 08/03/2013, 7:36 AM      

## 2013-08-03 NOTE — Transfer of Care (Signed)
Immediate Anesthesia Transfer of Care Note  Patient: [name]  Procedure(s) Performed: Procedure(s): INTRAMEDULLARY (IM) NAIL INTERTROCHANTRIC (Right)  Patient Location: ICU  Anesthesia Type:General  Level of Consciousness: sedated, unresponsive and Patient remains intubated per anesthesia plan  Airway & Oxygen Therapy: Patient remains intubated per anesthesia plan and Patient placed on Ventilator (see vital sign flow sheet for setting)  Post-op Assessment: Report given to PACU RN and Post -op Vital signs reviewed and stable  Post vital signs: Reviewed and stable  Complications: No apparent anesthesia complications

## 2013-08-03 NOTE — Anesthesia Preprocedure Evaluation (Addendum)
Anesthesia Evaluation  Patient identified by MRN, date of birth, ID band Patient awake and Patient confused    Reviewed: Allergy & Precautions, H&P , NPO status , Patient's Chart, lab work & pertinent test results, reviewed documented beta blocker date and time   Airway Mallampati: II TM Distance: >3 FB Neck ROM: Full    Dental  (+) Edentulous Upper, Edentulous Lower   Pulmonary shortness of breath, pneumonia -, COPDformer smoker,  + rhonchi   + decreased breath sounds      Cardiovascular hypertension, + CAD and + Peripheral Vascular Disease Rhythm:Irregular Rate:Tachycardia     Neuro/Psych    GI/Hepatic   Endo/Other    Renal/GU Renal disease     Musculoskeletal   Abdominal   Peds  Hematology   Anesthesia Other Findings   Reproductive/Obstetrics                         Anesthesia Physical Anesthesia Plan  ASA: IV  Anesthesia Plan: General   Post-op Pain Management:    Induction: Intravenous  Airway Management Planned: Oral ETT  Additional Equipment: Arterial line, CVP and Ultrasound Guidance Line Placement  Intra-op Plan:   Post-operative Plan: Possible Post-op intubation/ventilation  Informed Consent: I have reviewed the patients History and Physical, chart, labs and discussed the procedure including the risks, benefits and alternatives for the proposed anesthesia with the patient or authorized representative who has indicated his/her understanding and acceptance.   Dental advisory given  Plan Discussed with: CRNA, Anesthesiologist and Surgeon  Anesthesia Plan Comments:         Anesthesia Quick Evaluation

## 2013-08-03 NOTE — H&P (View-Only) (Signed)
Subjective: Patient comfortable.  Daughter present.     Objective: Vital signs in last 24 hours: Temp:  [97.4 F (36.3 C)-98 F (36.7 C)] 97.6 F (36.4 C) (05/19 0400) Pulse Rate:  [83-113] 97 (05/19 0400) Resp:  [20-28] 24 (05/19 0400) BP: (129-152)/(70-99) 129/85 mmHg (05/19 0400) SpO2:  [93 %-100 %] 100 % (05/19 0400) FiO2 (%):  [50 %] 50 % (05/19 0400)  Intake/Output from previous day: 05/18 0701 - 05/19 0700 In: 687 [I.V.:687] Out: 525 [Urine:525] Intake/Output this shift:     Recent Labs  07/31/13 1842 08/01/13 1554 08/02/13 0450 08/03/13 0357  HGB 14.2 13.2 12.8* 12.3*    Recent Labs  08/02/13 0450 08/03/13 0357  WBC 13.0* 14.3*  RBC 4.16* 3.96*  HCT 38.7* 37.2*  PLT 181 163    Recent Labs  08/02/13 0450 08/03/13 0357  NA 137 141  K 4.2 4.1  CL 99 105  CO2 25 25  BUN 14 16  CREATININE 0.82 0.69  GLUCOSE 161* 113*  CALCIUM 9.0 8.7    Recent Labs  08/01/13 1554  INR 1.04    Neurologically intact.    Assessment/Plan: Right hip fracture.  Proceed with surgery if medically cleared today.  Dr Alvan Dame can post surgery for 1600hrs.   Continue present care.     Deanthony Maull 08/03/2013, 7:36 AM

## 2013-08-03 NOTE — Progress Notes (Signed)
Patient ID: [name], male   DOB: [birth year], 80 y.o.   MRN: [id number]   I had a phone conversation with Erin Hearing NP this morning.  States that patient is cleared from a medical standpoint to have ORIF Right hip today.  Posted for 1600hrs with Dr Adriana Mccallum.  OR notified.  Patient is now NPO. Ebony Hail will let me know if there are any changes with our current plan.

## 2013-08-03 NOTE — Progress Notes (Signed)
08/03/2013 patient heart rate was 140's to 160's at 1315 notified Dr Sherral Hammers. Orders were given for a stat 12 lead ekg. Result of ekg patient was in a-fib. [facility] RN.

## 2013-08-03 NOTE — Progress Notes (Signed)
SLP Cancellation Note  Patient Details Name: [name] MRN: [id number] DOB: 25-Nov-1923   Cancelled treatment:        Pt. Unable to participate in MBS today due to NPO for surgery planned this afternoon.   Orbie Pyo Cosby.Ed [employer] [phone] 08/03/2013

## 2013-08-03 NOTE — Brief Op Note (Signed)
08/01/2013 - 08/03/2013  4:33 PM  PATIENT:  [name]  80 y.o. male  PRE-OPERATIVE DIAGNOSIS:  right intertrochanteric femur fracture  POST-OPERATIVE DIAGNOSIS:  right intertrochanteric femur fracture  PROCEDURE:  Procedure(s): INTRAMEDULLARY (IM) NAIL INTERTROCHANTRIC (Right), ORIF  SURGEON:  Surgeon(s) and Role:    * Mauri Pole, MD - Primary  PHYSICIAN ASSISTANT: None  ANESTHESIA:   general  EBL:  Total I/O In: 0  Out: 375 [Urine:375]  BLOOD ADMINISTERED:none  DRAINS: none   LOCAL MEDICATIONS USED:  NONE  SPECIMEN:  No Specimen  DISPOSITION OF SPECIMEN:  N/A  COUNTS:  YES  TOURNIQUET:  * No tourniquets in log *  DICTATION: .Other Dictation: Dictation Number [id number]  PLAN OF CARE: Admit to inpatient   PATIENT DISPOSITION:  ICU - intubated and hemodynamically stable.   Delay start of Pharmacological VTE agent (>24hrs) due to surgical blood loss or risk of bleeding: no

## 2013-08-03 NOTE — Progress Notes (Signed)
Pt arrived to room [id number] at this time accompanied by pacu staff and CRNA.  Pt is on full ventilator support.  On Neosynephrine and Cardizem iv gtt.  VSS.  Swabbed pt for mrsa.  Prophylactic sacral dressing placed on pt.  CHG bath done.

## 2013-08-03 NOTE — Progress Notes (Signed)
[name]  [name] KZS:[id number] DOB: 1923/05/28 DOA: 08/01/2013 PCP: [name], MD  Time spent :  91mins  Brief narrative: 80 y.o. male with a past medical history of atrial fibrillation, not on anticoagulation due to bleeding issues in the past, history of prostate cancer, history of a CVA, history of dementia, who lives with his daughter. Patient was accompanied by his daughter. According to the daughter he was sitting at the side of the bed and was feeling thirsty. She turned around to go to the kitchen and had walked a few steps and she heard a crashing sound. She immediately came back to his room and found him lying on the floor. He was awake. Apparently, there was no syncopal episode. He had complained of pain in the right hip area. EMS was called and the patient was brought into the hospital. Evaluation revealed an acute right intertrochanteric fracture.  Over the past couple of weeks patient has been having cough and the day before he had a fever up to 103F. He was evaluated in the emergency department 5/16 . Chest x-ray was inconclusive, but based on clinical presentation he was thought to have some kind of respiratory tract infection and was prescribed Levaquin. She hadn't yet filled that prescription. Patient has had dysphagia in the past and used to be on a special diet, but not anymore according to the daughter. Denied any sick contacts.   HPI/Subjective: More alert today- endorsing pain in affected hip  Assessment/Plan:  Hip fracture, right -Ortho following -plan OR 5/20 -[place] traction pending -if respiratory status remains stable over night, pt will be medically cleared to precede w/ surgery on 5/19 with no clear excluding cardiac signs/sx, but with general frailty and advanced age carrying with it a signif risk of perioperative cardiac event - this was discussed in very clear terms with his daughter at the bedside  again today, recognizing that alternative is life-long bedridden status which also carries a high mortality rate  Atrial Fibrillation/RVR -rates up to 130-140s- may be a little dry so IVFs increased from 40 to 75 while NPO -start Cardizem gtt-dc IV Hydralazine since resuming CCB -cont scheduled IV Lopressor-was on CCB PO pre admit -will need ECHO  Acute respiratory failure with hypoxia:   A) Fever/Atelectasis - LLL pneumonia/pneumonitis likely due to aspiration   B) COPD -fever onset 24 hrs prior to hip fracture -CT Chest c/w LLL mucous plug v/s aspiration -FU CXR 5/19 with improved aeration LLL but atlx remains -cont IVFs and pulmonary toilet with TCDB -cont nebs -cont IVFs  Possible ileus -no evidence on exam to suggest this clinically -keeping NPO regardless due to sedation -avoid NG unless vomiting noted    HYPERTENSION -BP controlled -too sleepy to swallow safely so change to IV meds -dc IV Hydralazine (see above)  ANEURYSM, ABDOMINAL AORTIC -not a surgical candidate  History of prostate cancer  Dementia/Metabolic encephalopathy -known dysphagia - consider SLP eval post-op to determine if there is a better/safe consistency for intake    DVT prophylaxis: SCDs Code Status: DO NOT RESUSCITATE Family Communication: daughter at bedside Disposition Plan/Expected LOS: SDU  Consultants: Orthopedics  Procedures: ORIF of right hip - pending  Antibiotics: levaquin 5/17 >>  Objective: Blood pressure 113/40, pulse 88, temperature 98.4 F (36.9 C), temperature source Axillary, resp. rate 27, height 6\' 2"  (1.88 m), weight 124 lb 12.5 oz (56.6 kg), SpO2 100.00%.  Intake/Output Summary (Last 24 hours) at 08/03/13  [place] filed at 08/03/13 1250  Gross per 24 hour  Intake    342 ml  Output    900 ml  Net   -558 ml   Exam: General: No acute respiratory distress Lungs: mild bibasilar crackles - no wheeze  Cardiovascular: Irregular rate which is tachycardic and  rhythm without murmur gallop or rub normal S1 and S2, no peripheral edema or JVD Abdomen: Nontender, nondistended, soft, bowel sounds positive, no rebound, no ascites, no appreciable mass Musculoskeletal: No significant cyanosis, clubbing of bilateral lower extremities   Scheduled Meds:  Scheduled Meds: . diltiazem  10 mg Intravenous Once  . levofloxacin (LEVAQUIN) IV  500 mg Intravenous Daily  . metoprolol  10 mg Intravenous 4 times per day  . sodium chloride  3 mL Intravenous Q12H   Data Reviewed: Basic Metabolic Panel:  Recent Labs Lab 07/31/13 1842 08/01/13 1554 08/02/13 0450 08/03/13 0357  NA 140 137 137 141  K 4.1 4.0 4.2 4.1  CL 99 99 99 105  CO2 27 25 25 25   GLUCOSE 112* 122* 161* 113*  BUN 12 16 14 16   CREATININE 0.83 1.02 0.82 0.69  CALCIUM 9.7 9.2 9.0 8.7   Liver Function Tests:  Recent Labs Lab 08/01/13 1554 08/02/13 0450  AST 27 31  ALT 22 20  ALKPHOS 132* 124*  BILITOT 0.7 0.9  PROT 7.1 6.5  ALBUMIN 3.7 3.4*   CBC:  Recent Labs Lab 07/31/13 1842 08/01/13 1554 08/02/13 0450 08/03/13 0357  WBC 9.6 11.3* 13.0* 14.3*  NEUTROABS  --  8.9*  --   --   HGB 14.2 13.2 12.8* 12.3*  HCT 42.4 39.9 38.7* 37.2*  MCV 93.8 94.1 93.0 93.9  PLT 222 215 181 163   Cardiac Enzymes:  Recent Labs Lab 08/01/13 1529  TROPONINI <0.30   BNP (last 3 results)  Recent Labs  07/31/13 1842  PROBNP 1679.0*    Recent Results (from the past 240 hour(s))  MRSA PCR SCREENING     Status: None   Collection Time    08/02/13  2:06 AM      Result Value Ref Range Status   MRSA by PCR NEGATIVE  NEGATIVE Final   Comment:            The GeneXpert MRSA Assay (FDA     approved for NASAL specimens     only), is one component of a     comprehensive MRSA colonization     surveillance program. It is not     intended to diagnose MRSA     infection nor to guide or     monitor treatment for     MRSA infections.     Studies:  Recent x-ray studies have been reviewed  in detail by the Attending Physician  Erin Hearing, ANP [facility] Office  [phone] Pager [phone]  **If unable to reach the above provider after paging please contact the [place] @ [phone]  On-Call/Text Page:      [email or web address]      password [email or web address]  If 7PM-7AM, please contact night-coverage [email or web address] Password [email or web address] 08/03/2013, 1:39 PM   LOS: 2 days    Examined patient with ANP Ebony Hail and agree with the above plan of starting patient on diltiazem for his A. fib new onset. Counseled family that surgery may not take him for his hip surgery today until A. fib better controlled but would be up to them. All questions answered. Greater than 35 minutes spent on medical management  of patient with multiple medical problems

## 2013-08-03 NOTE — Clinical Documentation Improvement (Signed)
Possible Clinical conditions   Underweight w/BMI 16. 9   And  Severe Malnutrition  Other condition___________________  Cannot clinically determine _____________  Clinical Indicators  Per 08/02/13 Nutritional Consult: Patient meets criteria for Severe malnutrition in the context of chronic illness  -Underweight  Patient meets criteria for severe malnutrition in the context of chronic illness as evidenced by severe muscle loss and severe subcutaneous fat loss.   Thank You, Serena Colonel ,RN Clinical Documentation Specialist:  [place] Information Management

## 2013-08-04 ENCOUNTER — Inpatient Hospital Stay (HOSPITAL_COMMUNITY): Payer: Medicare Other

## 2013-08-04 LAB — CBC
HCT: 26.3 % — ABNORMAL LOW (ref 39.0–52.0)
Hemoglobin: 9 g/dL — ABNORMAL LOW (ref 13.0–17.0)
MCH: 31.9 pg (ref 26.0–34.0)
MCHC: 34.2 g/dL (ref 30.0–36.0)
MCV: 93.3 fL (ref 78.0–100.0)
Platelets: 125 10*3/uL — ABNORMAL LOW (ref 150–400)
RBC: 2.82 MIL/uL — AB (ref 4.22–5.81)
RDW: 14.3 % (ref 11.5–15.5)
WBC: 9.9 10*3/uL (ref 4.0–10.5)

## 2013-08-04 LAB — BASIC METABOLIC PANEL
BUN: 18 mg/dL (ref 6–23)
CHLORIDE: 106 meq/L (ref 96–112)
CO2: 21 meq/L (ref 19–32)
CREATININE: 0.57 mg/dL (ref 0.50–1.35)
Calcium: 7.3 mg/dL — ABNORMAL LOW (ref 8.4–10.5)
GFR calc Af Amer: >90 mL/min (ref >90)
GFR calc non Af Amer: 90 mL/min — ABNORMAL LOW (ref >90)
Glucose, Bld: 133 mg/dL — ABNORMAL HIGH (ref 70–99)
Potassium: 3.8 mEq/L (ref 3.7–5.3)
Sodium: 137 mEq/L (ref 137–147)

## 2013-08-04 LAB — GLUCOSE, CAPILLARY
GLUCOSE-CAPILLARY: 114 mg/dL — AB (ref 70–99)
GLUCOSE-CAPILLARY: 125 mg/dL — AB (ref 70–99)
GLUCOSE-CAPILLARY: 141 mg/dL — AB (ref 70–99)
GLUCOSE-CAPILLARY: 92 mg/dL (ref 70–99)
Glucose-Capillary: 129 mg/dL — ABNORMAL HIGH (ref 70–99)

## 2013-08-04 LAB — PROCALCITONIN: Procalcitonin: <0.1 ng/mL

## 2013-08-04 MED ORDER — SODIUM CHLORIDE 0.9 % IV BOLUS (SEPSIS)
500.0000 mL | Freq: Once | INTRAVENOUS | Status: AC
  Administered 2013-08-04: 500 mL via INTRAVENOUS

## 2013-08-04 MED ORDER — TRAMADOL-ACETAMINOPHEN 37.5-325 MG PO TABS: 1.0000 | ORAL_TABLET | Freq: Four times a day (QID) | ORAL | Status: DC | PRN

## 2013-08-04 MED ORDER — METOPROLOL TARTRATE 1 MG/ML IV SOLN
5.0000 mg | Freq: Four times a day (QID) | INTRAVENOUS | Status: DC
  Administered 2013-08-04 – 2013-08-05 (×4): 5 mg via INTRAVENOUS
  Filled 2013-08-04 (×6): qty 5

## 2013-08-04 MED ORDER — FENTANYL CITRATE 0.05 MG/ML IJ SOLN
12.5000 ug | INTRAMUSCULAR | Status: DC | PRN
  Administered 2013-08-04 – 2013-08-05 (×3): 25 ug via INTRAVENOUS
  Filled 2013-08-04 (×3): qty 2

## 2013-08-04 MED ORDER — KCL IN DEXTROSE-NACL 20-5-0.45 MEQ/L-%-% IV SOLN
INTRAVENOUS | Status: DC
  Administered 2013-08-04 – 2013-08-07 (×4): via INTRAVENOUS
  Filled 2013-08-04 (×8): qty 1000

## 2013-08-04 MED ORDER — HALOPERIDOL LACTATE 5 MG/ML IJ SOLN
1.0000 mg | INTRAMUSCULAR | Status: DC | PRN
  Administered 2013-08-05 – 2013-08-06 (×3): 2 mg via INTRAVENOUS
  Filled 2013-08-04 (×2): qty 0.8
  Filled 2013-08-04: qty 1

## 2013-08-04 MED ORDER — SODIUM CHLORIDE 0.9 % IV BOLUS (SEPSIS)
1000.0000 mL | Freq: Once | INTRAVENOUS | Status: AC
  Administered 2013-08-04: 1000 mL via INTRAVENOUS

## 2013-08-04 MED ORDER — SODIUM CHLORIDE 0.9 % IV SOLN
INTRAVENOUS | Status: DC
Start: 2013-08-04 — End: 2013-08-04
  Administered 2013-08-04: 1000 mL via INTRAVENOUS

## 2013-08-04 MED ORDER — TRAMADOL-ACETAMINOPHEN 37.5-325 MG PO TABS: 1.0000 | ORAL_TABLET | Freq: Four times a day (QID) | ORAL | Status: AC | PRN

## 2013-08-04 NOTE — Progress Notes (Signed)
SLP Cancellation Note  Patient Details Name: [name] MRN: [id number] DOB: 04-28-1923   Cancelled treatment:       Reason Eval/Treat Not Completed: Patient not medically ready. Pt on ventilator   Katherene Ponto Yer Olivencia 08/04/2013, 7:42 AM

## 2013-08-04 NOTE — Op Note (Signed)
[name], [name] NO.:  [id number]  MEDICAL RECORD NO.:  [id number]  LOCATION:  2M06C                        FACILITY:  [facility]  PHYSICIAN:  Pietro Cassis. Alvan Dame, M.D.  DATE OF BIRTH:  10-Mar-1924  DATE OF PROCEDURE:  08/03/2013 DATE OF DISCHARGE:                              OPERATIVE REPORT   PREOPERATIVE DIAGNOSES: 1. Right intertrochanteric femur fracture. 2. History of atrial fibrillation. 3. Dementia. 4. Chronic obstructive pulmonary disease.  POSTOPERATIVE DIAGNOSES: 1. Right intertrochanteric femur fracture. 2. History of atrial fibrillation. 3. Dementia. 4. Chronic obstructive pulmonary disease.  PROCEDURE:  Open reduction and internal fixation of right intertrochanteric femur fracture.  Components used a [employer] AFFIXUS system with a size 11 x 180-mm nail with a 130-degree lag screw measuring 110 mm with the distal interlock.  SURGEON:  Pietro Cassis. Alvan Dame, M.D.  ASSISTANT:  Surgical team.  ANESTHESIA:  General, required central line and arterial line monitoring.  BLOOD LOSS:  Less than 100 mL.  COMPLICATION:  None.  DRAINS:  None.  INDICATION FOR PROCEDURE:  [name] is an 80 year old male with multiple medical comorbidities.  He had a history of left intertrochanteric femur fracture, fixed in 2013.  He currently resides with his daughter.  Unfortunately, there was a moment of time when his daughter left him and he had gotten up by himself and had a fall onto his right side, and he had immediate onset of pain and inability to bear weight.  He was brought to the emergency room.  Radiographs revealed a fracture of the right hip.  He was admitted to the Medical Service due to the multiple medical comorbidities.  It was felt that his medical comorbidities were as stable as possible and thus surgery indicated for pain relief and functional mobility back to baseline hopefully.  Risks and benefits were discussed with his daughter, who is power of  attorney. Consent was obtained for benefit of pain relief and mobility.  PROCEDURE IN DETAIL:  The patient was brought to the operative theater. Once adequate anesthesia, preoperative antibiotics, preoperatively administered antibiotics for pneumonia at a therapeutic dose, he was positioned supine.  His left unaffected extremity was positioned onto the Well leg holder.  Bony perineal post was placed and his right foot placed in the fracture table boots.  Slight traction and internal rotation was applied and locked in.  Radiographic evaluation of the hip revealed a reduced intertrochanteric femur fracture.  The right hip was then prepped and draped in sterile fashion using shower curtain technique.  A time-out was performed identifying the patient, planned procedure, and extremity.  Fluoroscopy was brought back to the field. Landmarks were identified including the proximal trochanter.  Incision was made proximal to lateral trochanter.  Guidewire was then inserted into the tip of the trochanter, confirmed radiographically.  The proximal femur was then drilled opened.  The 11 x 180-mm nail was selected and passed by hand to appropriate depth.  Guidewire was then inserted into the inferior central portion of the head in AP and lateral planes, measured the depth and selected a 110-mm lag screw.  This last screw was then tightened down, was then passed and confirmed radiographically.  I then passed the locking bolt and backed it off the quarter, turned to allow for some compression with weightbearing.  His bone quality was noted to be very poor.  A distal interlock was placed through the insertion jig, measuring 42 mm.  Final radiograph was obtained.  The wounds were irrigated with normal saline solution.  The jig was removed.  The proximal wound was closed in layers with #1 Vicryl in the gluteal fascia.  The remaining wound was closed with 2-0 Vicryl and staples on the skin.  The skin was  cleaned, dried and dressed sterilely with large Mepilex dressing.  He was transferred from the operating room, intubated to the intensive care unit based on his poor pulmonary function.  Findings were reviewed with his daughter.     Pietro Cassis Alvan Dame, M.D.     MDO/MEDQ  D:  08/03/2013  T:  08/04/2013  Job:  [id number]

## 2013-08-04 NOTE — Procedures (Signed)
Extubation Procedure Note  Patient Details:   Name: [name] DOB: May 09, 1923 MRN: [id number]   Airway Documentation:     Evaluation  O2 sats: stable throughout Complications: No apparent complications Patient did tolerate procedure well. Bilateral Breath Sounds: Inspiratory wheezes Suctioning: Oral;Airway Yes Order placed to extubate pt. by Dr. Alva Garnet with RN aware, pt. weaning well since this a.m., able to hold head off bed on own, (+) cuff leak, NIF-(-20)cmh20, FVC-(.8)L, placed on humidified 02@ 2 lpm n/c s/p with no Stridor noted, only able to pull 500cc X4 with I/S, RT to monitor. Dia Crawford 08/04/2013, 12:50 PM

## 2013-08-04 NOTE — Progress Notes (Signed)
Orthopedic Tech Progress Note Patient Details:  [name] [birth year] [id number] To weak to use overhead  Patient ID: [name], male   DOB: Aug 26, 1923, 80 y.o.   MRN: [id number]   [name] 08/04/2013, 11:31 AM

## 2013-08-04 NOTE — Progress Notes (Signed)
Anesthesiology Follow-up:  80 year old male with mild dementia, persistent afib, CVA,  prostate Ca suffered a right  Intertrochanteric hip fracture on 5/17 after a fall at home. He underwent ORIF of hip fracture yesterday. Prior to surgery, he had rapid ventricular response and was  on a cardiazem drip and had a intermittant cough and fevers on 50% venturi face mask. CXR was negative for pneumonia. He was also  lethargic  He underwent surgery under general anesthesia and the decision was made to leave him intubated  Due to his tenuous respiratory status and lethargy pre-op. He was extubated today at 12:00 noon. He now appears more alert still with rapid afib, respirations unlabored.   VS: T- 36.6 BP 104/49 HR 123 (rapid afib) RR 21 O2 Sat 98% on 2L   K-3.8 BUN/Cr- 18/0.57 H/H- 9.0/26.3 Platelets 125,000   Doing well POD #1. Appreciate CCM's help.  Roberts Gaudy

## 2013-08-04 NOTE — Progress Notes (Signed)
UR Completed.  Novice Vrba Jane Aliyana Dlugosz [phone] 08/04/2013  

## 2013-08-04 NOTE — Progress Notes (Signed)
Handoff given to 33M CSW. This CSW signing off.   Ky Barban, MSW, [place] Clinical Social Worker

## 2013-08-04 NOTE — Progress Notes (Signed)
PT Cancellation Note  Patient Details Name: [name] MRN: [id number] DOB: 13-Jul-1923   Cancelled Treatment:    Reason Eval/Treat Not Completed: Medical issues which prohibited therapy .  Pt on ventilator and preparing to wean.  Nursing asked PT to check back late in pm.  Will reattempt as able.  Thanks.   Kniyah Khun Ingold 08/04/2013, 9:20 AM [facility] [phone] (pager)

## 2013-08-04 NOTE — Progress Notes (Signed)
PULMONARY / CRITICAL CARE MEDICINE   Name: [name] MRN: [id number] DOB: 1924-03-14    ADMISSION DATE:  08/01/2013 CONSULTATION DATE:  08/03/2013  REFERRING MD :  Sherral Hammers PRIMARY SERVICE: PCCM  CHIEF COMPLAINT:  Shock  BRIEF PATIENT DESCRIPTION: 81 year old male, former smoker,  with PAF and dementia fell at home 5/17. In ED he was found to have R hip fracture and presumed URI. He was admitted and underwent ORIF of R hip 5/19. Postoperatively he remains intubated and has been transferred to ICU, PCCM to manage ICU care.   SIGNIFICANT EVENTS / STUDIES:  5/17 - CT spine > No acute fracture, No acute intracranial abnormality, Cervical spondylosis without fracture 5/17 - R hip XR > Acute right intertrochanteric fracture, with separate lesser trochanteric fragment. 5/17 - CT abd/pelvis -  intertrochanteric right hip fracture, Superior pubic ramus fracture on the right, Suspicious for Ileus. Slight increase in size of infrarenal AAA 5/18 - CTA chest >Negative for PE, nodule LLL, "severe COPD" 5/19 - ORIF R hip - On vent and phenylephrine post-op 5/20 Extubated. Vasopressors weaned to off  LINES / TUBES: ETT 5/19 >> 5/20  RIJ CVL 5/19 >>   CULTURES: none   ANTIBIOTICS: levaquin 5/17-5/19 zosyn 5/19 >> 5/20   SUBJECTIVE:  RASS -1. + F/C. Passed SBT. Extubated. Looks good post extubation  VITAL SIGNS: Temp:  [97.4 F (36.3 C)-98.9 F (37.2 C)] 98.9 F (37.2 C) (05/20 0900) Pulse Rate:  [31-115] 31 (05/20 1445) Resp:  [10-23] 23 (05/20 1445) BP: (72-119)/(48-72) 103/53 mmHg (05/20 1400) SpO2:  [92 %-100 %] 92 % (05/20 1445) Arterial Line BP: (72-130)/(40-63) 101/50 mmHg (05/20 1200) FiO2 (%):  [30 %-50 %] 30 % (05/20 1200) Weight:  [58.3 kg (128 lb 8.5 oz)] 58.3 kg (128 lb 8.5 oz) (05/20 0551) HEMODYNAMICS: CVP:  [2 mmHg] 2 mmHg VENTILATOR SETTINGS: Vent Mode:  [-] PSV;CPAP FiO2 (%):  [30 %-50 %] 30 % Set Rate:  [14 bmp-16 bmp] 14 bmp Vt Set:  [450 mL] 450 mL PEEP:  [5  cmH20] 5 cmH20 Pressure Support:  [5 cmH20] 5 cmH20 Plateau Pressure:  [11 cmH20-17 cmH20] 15 cmH20 INTAKE / OUTPUT: Intake/Output     05/19 0701 - 05/20 0700 05/20 0701 - 05/21 0700   I.V. (mL/kg) 2133.8 (36.6) 201.8 (3.5)   IV Piggyback 1574.5 25   Total Intake(mL/kg) 3708.3 (63.6) 226.8 (3.9)   Urine (mL/kg/hr) 995 (0.7) 92 (0.2)   Blood 50 (0)    Total Output 1045 92   Net +2663.3 +134.8          PHYSICAL EXAMINATION: General: elderly, cachectic, NAD Neuro:  No focal deficits HEENT: WNL Cardiovascular: IRIR, rate contrlled Lungs:  clear Abdomen:  Soft, non tender Ext: cool, no edema   LABS: I have reviewed all of today's lab results. Relevant abnormalities are discussed in the A/P section  CXR: improved LLL aeration  ASSESSMENT / PLAN:  PULMONARY A: Acute Respiratory Failure P:   Monitor in ICU post extubation Supp O2 PRN levalbuterol ordered  CARDIOVASCULAR A:  AFRVR - rate better controlled Hypotension due to propofol and diltiazem, resolved  P:  Holding home diltiazem Cont PRN metoprolol  MUSCULOSKELETAL A:   S/p ORIF R hip fx P:   Per ortho  RENAL A:   No acute issues P:   Monitor BMET intermittently Monitor I/Os Correct electrolytes as indicated Maintenance IVFs while NPO  GASTROINTESTINAL A:   No issues P:   SUP: N/I post ext NPO post  extubation   HEMATOLOGIC A:   Anemia without overt bleeding P:  DVT px: enox + SCDs Monitor CBC intermittently Transfuse per usual ICU guidelines  INFECTIOUS  A:   No overt infections P:   Micro and abx as above  ENDOCRINE A:   Mild hyperglycemia without prior DM P:   Monitor glu on chem panels Consider SSI for glu > 200  NEUROLOGIC A:   Acute encephalopathy Dementia, advanced  Post op pain P:   PRN fentanyl    TODAY'S SUMMARY:   I have personally obtained a history, examined the patient, evaluated laboratory and imaging results, formulated the assessment and plan and  placed orders. CRITICAL CARE: The patient is critically ill with multiple organ systems failure and requires high complexity decision making for assessment and support, frequent evaluation and titration of therapies, application of advanced monitoring technologies and extensive interpretation of multiple databases. Critical Care Time devoted to patient care services described in this note is 35 minutes.   Merton Border, MD ; [facility] [phone].  After 5:30 PM or weekends, call [phone]

## 2013-08-04 NOTE — Clinical Documentation Improvement (Signed)
Possible Clinical Conditions?  Severe Malnutrition   Other Condition Cannot clinically determine  Supporting Information: Per 08/02/13 Nutritional Consult: Patient meets criteria for Severe malnutrition in the context of chronic illness  -Underweight  Patient meets criteria for severe malnutrition in the context of chronic illness as evidenced by severe muscle loss and severe subcutaneous fat loss.   Thank You, Serena Colonel ,RN Clinical Documentation Specialist:  [place] Information Management

## 2013-08-04 NOTE — Progress Notes (Signed)
eLink Physician-Brief Progress Note Patient Name: [name] DOB: April 10, 1923 MRN: [id number]  Date of Service  08/04/2013   HPI/Events of Note  Failed bedside RN swallow eval Poorly controlled Afib, not able to take po   eICU Interventions  Schedule metoprolol, cont prn Speech eval for swallow   Intervention Category Intermediate Interventions: Arrhythmia - evaluation and management Minor Interventions: Routine modifications to care plan (e.g. PRN medications for pain, fever)  Juanito Doom 08/04/2013, 3:55 PM

## 2013-08-04 NOTE — Progress Notes (Signed)
[facility] Progress Note Patient Name: [name] DOB: 1923/10/28 MRN: [id number]  Date of Service  08/04/2013   HPI/Events of Note   Oliguria despite maintenance fluids  eICU Interventions  Bolus 500cc saline x1   Intervention Category Intermediate Interventions: Oliguria - evaluation and management  Juanito Doom 08/04/2013, 10:36 PM

## 2013-08-04 NOTE — Progress Notes (Signed)
   Subjective: 1 Day Post-Op Procedure(s) (LRB): INTRAMEDULLARY (IM) NAIL INTERTROCHANTRIC (Right)   Patient currently intubated and appears to be resting comfortably. No events throughout the night.  Objective:   VITALS:   Filed Vitals:   08/04/13  BP: 97/65  Pulse: 94  Temp: 97.4 F (36.3 C)   Resp: 14    Incision: dressing C/D/I No cellulitis present Compartment soft  LABS  Recent Labs  08/03/13 0357 08/03/13 2000 08/04/13 0430  HGB 12.3* 10.4* 9.0*  HCT 37.2* 31.4* 26.3*  WBC 14.3* 13.6* 9.9  PLT 163 146* 125*     Recent Labs  08/03/13 0357 08/03/13 2000 08/04/13 0430  NA 141 138 137  K 4.1 4.4 3.8  BUN 16 15 18   CREATININE 0.69 0.54 0.57  GLUCOSE 113* 140* 133*     Assessment/Plan: 1 Day Post-Op Procedure(s) (LRB): INTRAMEDULLARY (IM) NAIL INTERTROCHANTRIC (Right)    Ortho recommendations: Defer to medicine for anticoagulation, due to a history of bleeding related to a-fib. Ultram for pain management (Rx written). WBAT on the right leg. Dressing to remain in place for 2-3 days, then replace daily with gauze and tape. Follow up in 2 weeks at [facility]. Follow up with OLIN,Beyounce Dickens D in 2 weeks.  Contact information:  [facility] [address] [place] [facility]   PAC  08/04/2013, 7:34 AM

## 2013-08-04 NOTE — Progress Notes (Addendum)
LATE ENTRY  Clinical Social Work Department CLINICAL SOCIAL WORK PLACEMENT NOTE 08/04/2013  Patient:  ORBIE, GRUPE  Account Number:  [id number] Admit date:  08/01/2013  Clinical Social Worker:  Ky Barban, Latanya Presser  Date/time:  08/02/2013 09:32 AM  Clinical Social Work is seeking post-discharge placement for this patient at the following level of care:   [place]   (*CSW will update this form in Epic as items are completed)   N/A-family requested specific placements  Patient/family provided with [place] Department of Clinical Social Work's list of facilities offering this level of care within the geographic area requested by the patient (or if unable, by the patient's family).  08/02/2013  Patient/family informed of their freedom to choose among providers that offer the needed level of care, that participate in Medicare, Medicaid or managed care program needed by the patient, have an available bed and are willing to accept the patient.  N/A-not sent to this facility  Patient/family informed of [facility]' ownership interest in [facility], as well as of the fact that they are under no obligation to receive care at this facility.  PASARR submitted to EDS on EXISTING PASARR number received from EDS on EXISTING  FL2 transmitted to all facilities in geographic area requested by pt/family on  08/02/2013 FL2 transmitted to all facilities within larger geographic area on   Patient informed that his/her managed care company has contracts with or will negotiate with  certain facilities, including the following:     Patient/family informed of bed offers received:  08/08/2013 (BM) Patient chooses bed at [facility]) Physician recommends and patient chooses bed at    Patient to be transferred to [place] on  08/13/2013 (JS)   Patient to be transferred to facility by [facility] (JS)  The following physician request were entered in  Epic:   Additional Comments:   Ky Barban, MSW, [place] Social Worker [phone]

## 2013-08-04 NOTE — Progress Notes (Signed)
Pt foley removed approx 12 noon today.  No UOP @ 1700.  Bladder scan performed at bedside and no urine noted on scan.  Made contact with Dr. Lake Bells PCCM in [place] and made him aware.  Received instructions to continue to monitor at this time.   Richardean Canal RN, BSN, CCRN

## 2013-08-05 DIAGNOSIS — I369 Nonrheumatic tricuspid valve disorder, unspecified: Secondary | ICD-10-CM

## 2013-08-05 LAB — CBC
HCT: 25.6 % — ABNORMAL LOW (ref 39.0–52.0)
Hemoglobin: 8.5 g/dL — ABNORMAL LOW (ref 13.0–17.0)
MCH: 31.1 pg (ref 26.0–34.0)
MCHC: 33.2 g/dL (ref 30.0–36.0)
MCV: 93.8 fL (ref 78.0–100.0)
Platelets: 125 10*3/uL — ABNORMAL LOW (ref 150–400)
RBC: 2.73 MIL/uL — ABNORMAL LOW (ref 4.22–5.81)
RDW: 14.5 % (ref 11.5–15.5)
WBC: 7.9 10*3/uL (ref 4.0–10.5)

## 2013-08-05 LAB — BASIC METABOLIC PANEL
BUN: 16 mg/dL (ref 6–23)
CHLORIDE: 107 meq/L (ref 96–112)
CO2: 22 mEq/L (ref 19–32)
Calcium: 7.9 mg/dL — ABNORMAL LOW (ref 8.4–10.5)
Creatinine, Ser: 0.65 mg/dL (ref 0.50–1.35)
GFR calc non Af Amer: 85 mL/min — ABNORMAL LOW (ref >90)
Glucose, Bld: 97 mg/dL (ref 70–99)
POTASSIUM: 3.4 meq/L — AB (ref 3.7–5.3)
Sodium: 140 mEq/L (ref 137–147)

## 2013-08-05 MED ORDER — DILTIAZEM HCL ER COATED BEADS 120 MG PO CP24
120.0000 mg | ORAL_CAPSULE | Freq: Every day | ORAL | Status: DC
  Filled 2013-08-05: qty 1

## 2013-08-05 MED ORDER — METOPROLOL TARTRATE 25 MG PO TABS
25.0000 mg | ORAL_TABLET | Freq: Two times a day (BID) | ORAL | Status: DC
  Administered 2013-08-05 – 2013-08-07 (×4): 25 mg via ORAL
  Filled 2013-08-05 (×6): qty 1

## 2013-08-05 MED ORDER — DILTIAZEM HCL 60 MG PO TABS
60.0000 mg | ORAL_TABLET | Freq: Two times a day (BID) | ORAL | Status: DC
  Administered 2013-08-05 – 2013-08-07 (×4): 60 mg via ORAL
  Filled 2013-08-05 (×6): qty 1

## 2013-08-05 MED ORDER — ASPIRIN EC 81 MG PO TBEC
81.0000 mg | DELAYED_RELEASE_TABLET | Freq: Every evening | ORAL | Status: DC
  Administered 2013-08-05: 81 mg via ORAL
  Filled 2013-08-05 (×3): qty 1

## 2013-08-05 NOTE — Evaluation (Signed)
Occupational Therapy Evaluation Patient Details Name: [name] MRN: [id number] DOB: [birth year] Today's Date: 08/05/2013    History of Present Illness Pt admit with right hip fracture with ORIF/IM nail.     Clinical Impression   Pt presents with dependence in bathing, dressing, and toileting.  He requires +2 assist for all mobility.  Pt has difficulty with direction following and demonstrates a poor memory with baseline dementia.  Recommending ST rehab in SNF.  Will defer OT to SNF.    Follow Up Recommendations  SNF;Supervision/Assistance - 24 hour    Equipment Recommendations       Recommendations for Other Services       Precautions / Restrictions Precautions Precautions: Fall Restrictions Weight Bearing Restrictions: Yes RLE Weight Bearing: Weight bearing as tolerated      Mobility Bed Mobility Pt up in chair.  Transfers Overall transfer level: Needs assistance Equipment used: Rolling walker (2 wheeled) Transfers: Sit to/from [facility] Sit to Stand: Max assist;+2 physical assistance Stand pivot transfers: Mod assist;+2 physical assistance;Max assist           Balance Overall balance assessment: Needs assistance;History of Falls Sitting-balance support: Bilateral upper extremity supported;Feet supported Sitting balance-Leahy Scale: Poor  Postural control: Posterior lean Standing balance support: Bilateral upper extremity supported;During functional activity Standing balance-Leahy Scale: Poor                             ADL Overall ADL's : Needs assistance/impaired Eating/Feeding: NPO   Grooming: Wash/dry hands;Wash/dry face;Supervision/safety;Sitting   Upper Body Bathing: Minimal assitance;Sitting   Lower Body Bathing: Total assistance;+2 for physical assistance;Sit to/from stand   Upper Body Dressing : Minimal assistance;Sitting   Lower Body Dressing: +2 for physical assistance;Total assistance;Sit to/from stand                Functional mobility during ADLs: +2 for physical assistance;Total assistance       Vision                     Perception     Praxis      Pertinent Vitals/Pain HR variable up to mid 140s, R hip pain, did not rate, repositioned     Hand Dominance Right   Extremity/Trunk Assessment Upper Extremity Assessment Upper Extremity Assessment: Overall WFL for tasks assessed   Lower Extremity Assessment Lower Extremity Assessment: Defer to PT evaluation RLE: Unable to fully assess due to pain   Cervical / Trunk Assessment Cervical / Trunk Assessment: Kyphotic   Communication Communication Communication: No difficulties   Cognition Arousal/Alertness: Awake/alert Behavior During Therapy: WFL for tasks assessed/performed;Anxious Overall Cognitive Status: History of cognitive impairments - at baseline                     General Comments       Exercises Exercises: General Lower Extremity     Shoulder Instructions      Home Living Family/patient expects to be discharged to:: Skilled nursing facility Living Arrangements: Children Available Help at Discharge: Available PRN/intermittently;Family Type of Home: House                       Home Equipment: Walker - 2 wheels;Cane - single point;Bedside commode;Hospital bed   Additional Comments: Pt poor historian and no family present      Prior Functioning/Environment Level of Independence: Needs assistance  Gait / Transfers Assistance Needed: min guard asssit  with RW ADL's / Homemaking Assistance Needed: min assist   Comments: Per previous admit daughter reports pt relies on them for alot.    OT Diagnosis: Generalized weakness;Cognitive deficits;Acute pain   OT Problem List: Decreased strength;Decreased activity tolerance;Impaired balance (sitting and/or standing);Decreased cognition;Decreased safety awareness;Decreased knowledge of use of DME or AE;Pain   OT  Treatment/Interventions:      OT Goals(Current goals can be found in the care plan section) Acute Rehab OT Goals Patient Stated Goal: to go home after NH  OT Frequency:     Barriers to D/C:            Co-evaluation              End of Session Nurse Communication:  Pt is NPO  Activity Tolerance: Patient limited by pain Patient left: in chair;with call bell/phone within reach;with family/visitor present   Time: 1300-1314 OT Time Calculation (min): 14 min Charges:  OT General Charges $OT Visit: 1 Procedure OT Evaluation $Initial OT Evaluation Tier I: 1 Procedure OT Treatments $Self Care/Home Management : 8-22 mins G-Codes:    [name] [name] 08/29/13, 2:08 PM [phone]

## 2013-08-05 NOTE — Evaluation (Signed)
Physical Therapy Evaluation Patient Details Name: [name] MRN: [id number] DOB: 07/23/1923 Today's Date: 08/05/2013   History of Present Illness  Pt admit with right hip fracture with ORIF/IM nail.    Clinical Impression  Pt admitted with above. Pt currently with functional limitations due to the deficits listed below (see PT Problem List).  Pt will benefit from skilled PT to increase their independence and safety with mobility to allow discharge to the venue listed below.     Follow Up Recommendations SNF;Supervision/Assistance - 24 hour    Equipment Recommendations  None recommended by PT    Recommendations for Other Services       Precautions / Restrictions Precautions Precautions: Fall Restrictions Weight Bearing Restrictions: Yes RLE Weight Bearing: Weight bearing as tolerated      Mobility  Bed Mobility Overal bed mobility: Needs Assistance;+2 for physical assistance Bed Mobility: Supine to Sit     Supine to sit: Total assist;+2 for physical assistance     General bed mobility comments: Pt needed assist for LEs and for elevation of trunk.  Transfers Overall transfer level: Needs assistance Equipment used: Rolling walker (2 wheeled) Transfers: Sit to/from [facility] Sit to Stand: Max assist;+2 physical assistance Stand pivot transfers: Mod assist;+2 physical assistance;Max assist       General transfer comment: Pt needed cues for hand placement. Took incr assist to get up to RW with pt reaching up for RW to pull up.  Pt with significant posterior lean losing balance posteriorly needing assist to keep pt forward.  Pt unable to follow commands to turn to chair needing assist to weight shift and assist to move right LE.  Needed guidance at hips to pivot around.  Does not follow commands well for transfer and pt neeeded assist to control descent into chair.    Ambulation/Gait                Stairs            Wheelchair Mobility    Modified Rankin (Stroke Patients Only)       Balance Overall balance assessment: Needs assistance;History of Falls Sitting-balance support: Bilateral upper extremity supported;Feet supported Sitting balance-Leahy Scale: Poor Sitting balance - Comments: Pt leaning posteriorly and needed mod to total assist to sit EOB.   Postural control: Posterior lean Standing balance support: Bilateral upper extremity supported;During functional activity Standing balance-Leahy Scale: Poor Standing balance comment: Pt needed assist to stand statically with RW as he leans posteriorly and needs incr assist for upright posture.                               Pertinent Vitals/Pain HR up to 150 bpm with transfer.  Other VSS, Some right hip pain but pt could not rate.      Home Living Family/patient expects to be discharged to:: Skilled nursing facility Living Arrangements: Children Available Help at Discharge: Available PRN/intermittently;Family Type of Home: House         Home Equipment: Walker - 2 wheels;Cane - single point;Bedside commode;Hospital bed Additional Comments: Pt poor historian and no family present    Prior Function Level of Independence: Needs assistance   Gait / Transfers Assistance Needed: min guard asssit with RW  ADL's / Homemaking Assistance Needed: min assist  Comments: Per previous admit daughter reports pt relies on them for alot.     Hand Dominance        Extremity/Trunk Assessment  Upper Extremity Assessment: Defer to OT evaluation           Lower Extremity Assessment: RLE deficits/detail      Cervical / Trunk Assessment: Kyphotic  Communication   Communication: No difficulties  Cognition Arousal/Alertness: Awake/alert Behavior During Therapy: WFL for tasks assessed/performed;Anxious Overall Cognitive Status: History of cognitive impairments - at baseline                      General Comments      Exercises General  Exercises - Lower Extremity Ankle Circles/Pumps: AAROM;Both;5 reps;Supine Heel Slides: AAROM;Both;5 reps;Supine      Assessment/Plan    PT Assessment Patient needs continued PT services  PT Diagnosis Generalized weakness;Acute pain   PT Problem List Decreased activity tolerance;Decreased balance;Decreased mobility;Decreased knowledge of use of DME;Decreased safety awareness;Decreased knowledge of precautions;Pain  PT Treatment Interventions DME instruction;Gait training;Functional mobility training;Therapeutic activities;Therapeutic exercise;Balance training;Patient/family education   PT Goals (Current goals can be found in the Care Plan section) Acute Rehab PT Goals Patient Stated Goal: to go home after NH PT Goal Formulation: With patient Time For Goal Achievement: 08/12/13 Potential to Achieve Goals: Good    Frequency Min 3X/week   Barriers to discharge        Co-evaluation               End of Session Equipment Utilized During Treatment: Gait belt;Oxygen Activity Tolerance: Patient limited by fatigue Patient left: in chair;with call bell/phone within reach;with chair alarm set;with nursing/sitter in room Nurse Communication: Mobility status;Need for lift equipment;Weight bearing status         Time: 1211-1240 PT Time Calculation (min): 29 min   Charges:   PT Evaluation $Initial PT Evaluation Tier I: 1 Procedure PT Treatments $Therapeutic Activity: 8-22 mins   PT G Codes:          Jovana Rembold Ingold 08-07-13, 1:00 PM  [place] [facility] [phone] (pager)

## 2013-08-05 NOTE — Anesthesia Postprocedure Evaluation (Signed)
  Anesthesia Post-op Note  Patient: [name]  Procedure(s) Performed: Procedure(s): INTRAMEDULLARY (IM) NAIL INTERTROCHANTRIC (Right)  Patient Location: ICU  Anesthesia Type:General  Level of Consciousness: awake and oriented  Airway and Oxygen Therapy: Patient Spontanous Breathing and Patient connected to nasal cannula oxygen  Post-op Pain: mild  Post-op Assessment: Post-op Vital signs reviewed, Patient's Cardiovascular Status Stable, Respiratory Function Stable, Patent Airway and Pain level controlled  Post-op Vital Signs: stable  Last Vitals:  Filed Vitals:   08/05/13 1000  BP: 138/84  Pulse: 64  Temp:   Resp: 23    Complications: No apparent anesthesia complications

## 2013-08-05 NOTE — Progress Notes (Signed)
PULMONARY / CRITICAL CARE MEDICINE   Name: [name] MRN: [id number] DOB: [birth year]    ADMISSION DATE:  08/01/2013 CONSULTATION DATE:  08/03/2013  REFERRING MD :  Sherral Hammers PRIMARY SERVICE: PCCM  CHIEF COMPLAINT:  Shock  BRIEF PATIENT DESCRIPTION: 80 year old male, former smoker,  with PAF and dementia fell at home 5/17. In ED he was found to have R hip fracture and presumed URI. He was admitted and underwent ORIF of R hip 5/19. Postoperatively he remains intubated and has been transferred to ICU, PCCM to manage ICU care.   SIGNIFICANT EVENTS / STUDIES:  5/17 - CT spine > No acute fracture, No acute intracranial abnormality, Cervical spondylosis without fracture 5/17 - R hip XR > Acute right intertrochanteric fracture, with separate lesser trochanteric fragment. 5/17 - CT abd/pelvis -  intertrochanteric right hip fracture, Superior pubic ramus fracture on the right, Suspicious for Ileus. Slight increase in size of infrarenal AAA 5/18 - CTA chest >Negative for PE, nodule LLL, "severe COPD" 5/19 - ORIF R hip - On vent and phenylephrine post-op 5/20 Extubated. Vasopressors weaned to off 521 Echocardiogram:  5/21 transfer to tele. TRH to resume care as of AM 5/22 and PCCM service to sign off  LINES / TUBES: ETT 5/19 >> 5/20  RIJ CVL 5/19 >>   CULTURES: none   ANTIBIOTICS: levaquin 5/17-5/19 zosyn 5/19 >> 5/20   SUBJECTIVE:  NAD, + FC  VITAL SIGNS: Temp:  [97.3 F (36.3 C)-98.5 F (36.9 C)] 97.7 F (36.5 C) (05/21 1200) Pulse Rate:  [34-147] 147 (05/21 1529) Resp:  [15-36] 36 (05/21 1500) BP: (93-173)/(49-106) 150/106 mmHg (05/21 1529) SpO2:  [94 %-100 %] 94 % (05/21 1500) HEMODYNAMICS:   VENTILATOR SETTINGS:   INTAKE / OUTPUT: Intake/Output     05/20 0701 - 05/21 0700 05/21 0701 - 05/22 0700   I.V. (mL/kg) 1298 (22.3) 600 (10.3)   IV Piggyback 525    Total Intake(mL/kg) 1823 (31.3) 600 (10.3)   Urine (mL/kg/hr) 392 (0.3)    Blood     Total Output 392     Net  +1431 +600        Urine Occurrence  3 x   Stool Occurrence  1 x     PHYSICAL EXAMINATION: General: elderly, cachectic, NAD Neuro:  No focal deficits HEENT: WNL Cardiovascular: IRIR, rate controlled Lungs:  clear Abdomen:  Soft, non tender Ext: cool, no edema   LABS: I have reviewed all of today's lab results. Relevant abnormalities are discussed in the A/P section  CXR: NNF  ASSESSMENT / PLAN:  PULMONARY A: Acute Respiratory Failure P:   Cont supp O2 Cont PRN levalbuterol ordered  CARDIOVASCULAR A:  AFRVR - rate controlled Hypotension, resolved  P:  Resume oral dilt and metoprolol at reduced doses Resume aspriin Cont PRN metoprolol  MUSCULOSKELETAL A:   S/p ORIF R hip fx P:   Per ortho  RENAL A:   No acute issues P:   Monitor BMET intermittently Monitor I/Os Correct electrolytes as indicated Cont maintenance IVFs while NPO  GASTROINTESTINAL A:   No issues P:   SUP: N/I post ext NPO post extubation pending SLP eval  HEMATOLOGIC A:   Anemia without overt bleeding P:  DVT px: enox + SCDs Monitor CBC intermittently Transfuse per usual ICU guidelines  INFECTIOUS  A:   No overt infections P:   Micro and abx as above  ENDOCRINE A:   Mild hyperglycemia without prior DM P:   Monitor glu on chem  panels Consider SSI for glu > 200  NEUROLOGIC A:   Acute encephalopathy Dementia, advanced  Post op pain P:   PRN fentanyl    TODAY'S SUMMARY:  transfer to tele. TRH to resume care as of AM 5/22 and PCCM service to sign off. Discussed with Dr Marton Redwood, MD ; [facility] [phone].  After 5:30 PM or weekends, call [phone]

## 2013-08-05 NOTE — Progress Notes (Signed)
  Echocardiogram 2D Echocardiogram has been performed.  [name] 08/05/2013, 3:01 PM

## 2013-08-05 NOTE — Evaluation (Signed)
Clinical/Bedside Swallow Evaluation Patient Details  Name: [name] MRN: [id number] Date of Birth: [birth year]  Today's Date: 08/05/2013 Time: 1015-1036 SLP Time Calculation (min): 21 min  Past Medical History:  Past Medical History  Diagnosis Date  . Atrial fibrillation   . AAA (abdominal aortic aneurysm)   . Carotid stenosis   . Hypercholesteremia   . Cardiomyopathy     improved  . Renal artery stenosis   . COPD (chronic obstructive pulmonary disease)   . Osteoarthritis   . Atrial flutter     s/p ablation  . Hip fracture   . DEMENTIA   . Hypertension   . Macular degeneration disease   . Prostate cancer    Past Surgical History:  Past Surgical History  Procedure Laterality Date  . Appendectomy    . Cholecystectomy    . Tonsillectomy     HPI:  80 y.o. male with a past medical history of atrial fibrillation, HTN, AAA, COPD, prostate cancer, history of a CVA, history of dementia, who lives with his daughter. Cough over past few weeks MD note documented fever up to 103F.   Pt. admitted after fall with resultant right hip fractureHistory of dysphagia and used to be on a special diet, but not anymore according to the daughter. Pt. has had 4 previous MBS' with Dys 3, nectar for past 3 (09/18/10). CXR 5/17 revealed interval worsening aeration at the left base compatible with developing left lower lobe atelectasis. Superimposed airspace consolidation from infection or recent aspiration is not excluded.  Chest CT revealed soft tissue effaces the left lower lobe bronchus suggesting mucus plugging/aspiration, with bronchial wall thickening/bronchitis within the left upper lobe and lingula. Pt assessed by SLP on 5/18l, determined to need objective study, but not ready. Underwent ORIF on 5/19, remained intubated until 5/20. New orders received.    Assessment / Plan / Recommendation Clinical Impression  Pt reassessed following intubation. Pt continues to demonstrate consistent evidence of  a baseline dysphagia, likely worsened by decompensation. Pt with wet vocal quality, multiple swallows, and cough. Suspect frequent silent aspiration as well. Pt will need objective test prior to initiating diet. Discussed with RN who reports pt will need to work with PT today given recent ORIF. Will plan for [facility] tomorrow am with hope that pt will be rested. Recommend meds crushed in puree tonight as needed. Very few ice chips may be given for comfort if needed.     Aspiration Risk  Severe    Diet Recommendation NPO except meds;Ice chips PRN after oral care   Medication Administration: Crushed with puree    Other  Recommendations Recommended Consults: MBS Oral Care Recommendations: Oral care Q4 per protocol   Follow Up Recommendations  Skilled Nursing facility    Frequency and Duration        Pertinent Vitals/Pain NA    SLP Swallow Goals     Swallow Study Prior Functional Status       General HPI: 80 y.o. male with a past medical history of atrial fibrillation, HTN, AAA, COPD, prostate cancer, history of a CVA, history of dementia, who lives with his daughter. Cough over past few weeks MD note documented fever up to 103F.   Pt. admitted after fall with resultant right hip fractureHistory of dysphagia and used to be on a special diet, but not anymore according to the daughter. Pt. has had 4 previous MBS' with Dys 3, nectar for past 3 (09/18/10). CXR 5/17 revealed interval worsening aeration at the left base  compatible with developing left lower lobe atelectasis. Superimposed airspace consolidation from infection or recent aspiration is not excluded.  Chest CT revealed soft tissue effaces the left lower lobe bronchus suggesting mucus plugging/aspiration, with bronchial wall thickening/bronchitis within the left upper lobe and lingula. Pt assessed by SLP on 5/18l, determined to need objective study, but not ready. Underwent ORIF on 5/19, remained intubated until 5/20. New orders received.   Type of Study: Bedside swallow evaluation Previous Swallow Assessment: 5/18 - NPO and 4 prior MBS  Diet Prior to this Study: NPO Temperature Spikes Noted: No Respiratory Status: Nasal cannula History of Recent Intubation: Yes Length of Intubations (days): 2 days Date extubated: 08/04/13 Behavior/Cognition: Requires cueing;Alert;Cooperative;Pleasant mood Oral Cavity - Dentition: Dentures, not available Self-Feeding Abilities: Needs assist Patient Positioning: Upright in bed Baseline Vocal Quality: Low vocal intensity;Wet Volitional Cough: Weak;Congested Volitional Swallow: Able to elicit    Oral/Motor/Sensory Function Overall Oral Motor/Sensory Function: Appears within functional limits for tasks assessed   Ice Chips Ice chips: Impaired Presentation: Spoon Pharyngeal Phase Impairments: Suspected delayed Swallow;Wet Vocal Quality   Thin Liquid Thin Liquid: Impaired Presentation: Spoon Pharyngeal  Phase Impairments: Suspected delayed Swallow;Multiple swallows;Wet Vocal Quality;Cough - Immediate    Nectar Thick Nectar Thick Liquid: Not tested   Honey Thick Honey Thick Liquid: Not tested   Puree Puree: Impaired Presentation: Spoon Pharyngeal Phase Impairments: Suspected delayed Swallow;Multiple swallows;Wet Vocal Quality   Solid   GO    Solid: Not tested      Herbie Baltimore, MA CCC-SLP [place] Elbony Mcclimans 08/05/2013,10:41 AM

## 2013-08-06 ENCOUNTER — Inpatient Hospital Stay (HOSPITAL_COMMUNITY): Payer: Medicare Other

## 2013-08-06 ENCOUNTER — Encounter (HOSPITAL_COMMUNITY): Payer: Self-pay | Admitting: Orthopedic Surgery

## 2013-08-06 DIAGNOSIS — R1310 Dysphagia, unspecified: Secondary | ICD-10-CM

## 2013-08-06 DIAGNOSIS — R5381 Other malaise: Secondary | ICD-10-CM

## 2013-08-06 LAB — POCT I-STAT 7, (LYTES, BLD GAS, ICA,H+H)
Acid-base deficit: 1 mmol/L (ref 0.0–2.0)
BICARBONATE: 24.3 meq/L — AB (ref 20.0–24.0)
Calcium, Ion: 1.22 mmol/L (ref 1.13–1.30)
HCT: 33 % — ABNORMAL LOW (ref 39.0–52.0)
HEMOGLOBIN: 11.2 g/dL — AB (ref 13.0–17.0)
O2 Saturation: 96 %
PCO2 ART: 43.7 mmHg (ref 35.0–45.0)
PH ART: 7.351 (ref 7.350–7.450)
POTASSIUM: 3.7 meq/L (ref 3.7–5.3)
Sodium: 137 mEq/L (ref 137–147)
TCO2: 26 mmol/L (ref 0–100)
pO2, Arterial: 85 mmHg (ref 80.0–100.0)

## 2013-08-06 LAB — CBC
HCT: 23.9 % — ABNORMAL LOW (ref 39.0–52.0)
Hemoglobin: 8.2 g/dL — ABNORMAL LOW (ref 13.0–17.0)
MCH: 31.4 pg (ref 26.0–34.0)
MCHC: 34.3 g/dL (ref 30.0–36.0)
MCV: 91.6 fL (ref 78.0–100.0)
Platelets: 143 10*3/uL — ABNORMAL LOW (ref 150–400)
RBC: 2.61 MIL/uL — AB (ref 4.22–5.81)
RDW: 14.3 % (ref 11.5–15.5)
WBC: 6.9 10*3/uL (ref 4.0–10.5)

## 2013-08-06 MED ORDER — RESOURCE THICKENUP CLEAR PO POWD
ORAL | Status: DC | PRN
  Filled 2013-08-06: qty 125

## 2013-08-06 MED ORDER — ENSURE PUDDING PO PUDG
1.0000 | Freq: Three times a day (TID) | ORAL | Status: DC
  Administered 2013-08-06 – 2013-08-07 (×4): 1 via ORAL

## 2013-08-06 NOTE — Progress Notes (Signed)
NUTRITION FOLLOW UP  DOCUMENTATION CODES Per approved criteria  -Severe malnutrition in the context of chronic illness -Underweight   INTERVENTION: Ensure Pudding po TID, each supplement provides 170 kcal and 4 grams of protein RD to follow for nutrition care plan  NUTRITION DIAGNOSIS: Increased nutrient needs related to hip fracture as evidenced by estimated nutrition needs, ongoing  Goal: Pt to meet >/= 90% of their estimated nutrition needs, unmet  Monitor:  PO & supplemental intake, weight, labs, I/O's  ASSESSMENT: 80 y.o. male with a PMH of atrial fibrillation, prostate cancer, CVA and dementia.  Patient s/p fall at home. EMS was called and the patient was brought into the hospital.  X-ray showed IT hip fracture.  Patient s/p procedure 5/19: INTRAMEDULLARY (IM) NAIL INTERTROCHANTRIC (Right), ORIF  S/p MBSS 5/22.  Pt diagnosed with mild oral phase, severe pharyngeal phase dysphagia.  SLP recommending Dys 1, honey-thick liquids.  Diet advanced this AM, 1041.  Will order supplements to optimize nutritional status.  Daughter anxious.  Informed her pt should receive lunch tray today.  Height: Ht Readings from Last 1 Encounters:  08/04/13 6\' 2"  (1.88 m)    Weight: Wt Readings from Last 1 Encounters:  08/04/13 128 lb 8.5 oz (58.3 kg)   08/02/12 132 lbs (RD re-weighed)  BMI:  16.9 kg/m2 (using 132 lbs)  Estimated Nutritional Needs: Kcal: 1600-1800 Protein: 80-90 gm Fluid: 1.6-1.8 L  Skin: Intact  Diet Order: Dysphagia 1, honey-thick liquids    Intake/Output Summary (Last 24 hours) at 08/06/13 1120 Last data filed at 08/05/13 1800  Gross per 24 hour  Intake    525 ml  Output    100 ml  Net    425 ml    Labs:   Recent Labs Lab 08/03/13 2000 08/04/13 0430 08/05/13 0510  NA 138 137 140  K 4.4 3.8 3.4*  CL 103 106 107  CO2 24 21 22   BUN 15 18 16   CREATININE 0.54 0.57 0.65  CALCIUM 8.0* 7.3* 7.9*  GLUCOSE 140* 133* 97    Scheduled Meds: .  antiseptic oral rinse  15 mL Mouth Rinse QID  . aspirin EC  81 mg Oral QPM  . chlorhexidine  15 mL Mouth Rinse BID  . diltiazem  60 mg Oral Q12H  . enoxaparin (LOVENOX) injection  40 mg Subcutaneous Q24H  . metoprolol  25 mg Oral BID  . sodium chloride  3 mL Intravenous Q12H    Continuous Infusions: . dextrose 5 % and 0.45 % NaCl with KCl 20 mEq/L 75 mL/hr at 08/06/13 0055  . phenylephrine (NEO-SYNEPHRINE) Adult infusion Stopped (08/04/13 1155)    Past Medical History  Diagnosis Date  . Atrial fibrillation   . AAA (abdominal aortic aneurysm)   . Carotid stenosis   . Hypercholesteremia   . Cardiomyopathy     improved  . Renal artery stenosis   . COPD (chronic obstructive pulmonary disease)   . Osteoarthritis   . Atrial flutter     s/p ablation  . Hip fracture   . DEMENTIA   . Hypertension   . Macular degeneration disease   . Prostate cancer     Past Surgical History  Procedure Laterality Date  . Appendectomy    . Cholecystectomy    . Tonsillectomy    . Intramedullary (im) nail intertrochanteric Right 08/03/2013    Procedure: INTRAMEDULLARY (IM) NAIL INTERTROCHANTRIC;  Surgeon: Mauri Pole, MD;  Location: [place];  Service: Orthopedics;  Laterality: Right;    Arthur Holms,  RD, LDN Pager #: [phone] After-Hours Pager #: [phone]

## 2013-08-06 NOTE — Clinical Social Work Note (Signed)
Pt transferred from 39M to 2W.  Report given to 2W CSW.  39M CSW signing off.  Nonnie Done, [place] [phone]  Clinical Social Work

## 2013-08-06 NOTE — Progress Notes (Signed)
Chart reviewed.   [facility] PROGRESS NOTE  [name] URK:[id number] DOB: 07/03/1923 DOA: 08/01/2013 PCP: [name], MD  Assessment/Plan:  Principal Problem:   Hip fracture, right Active Problems:   HYPERTENSION   FIBRILLATION, ATRIAL   ANEURYSM, ABDOMINAL AORTIC   COPD   History of prostate cancer   Dementia   Acute respiratory failure with hypoxia   Atelectasis   Fever   Metabolic encephalopathy   Hypotension anemia: monitor Protein calorie malnutrition  Started on dysphagia diet. Heart rate rapid. Did not get po cardizem today  Code Status:  DNR Family Communication:  None available Disposition Plan:  SNF  HPI/Subjective: No pain, some dyspnea  Objective: Filed Vitals:   08/06/13 1319  BP: 172/83  Pulse: 85  Temp: 98 F (36.7 C)  Resp: 18    Intake/Output Summary (Last 24 hours) at 08/06/13 1601 Last data filed at 08/05/13 1800  Gross per 24 hour  Intake    150 ml  Output    100 ml  Net     50 ml   Filed Weights   08/01/13 1532 08/02/13 0401 08/04/13 0551  Weight: 64.411 kg (142 lb) 56.6 kg (124 lb 12.5 oz) 58.3 kg (128 lb 8.5 oz)    Exam:   General:  Alert, thin, weak appearing. Appropriate and cooperative  HEENT: dry MM. edentulous  Cardiovascular: irreg irreg  Respiratory: CTA without WRR  Abdomen: S, NT, ND  Ext: no CCE. Hip dressing CDI  Basic Metabolic Panel:  Recent Labs Lab 08/02/13 0450 08/03/13 0357 08/03/13 1739 08/03/13 2000 08/04/13 0430 08/05/13 0510  NA 137 141 137 138 137 140  K 4.2 4.1 3.7 4.4 3.8 3.4*  CL 99 105  --  103 106 107  CO2 25 25  --  24 21 22   GLUCOSE 161* 113*  --  140* 133* 97  BUN 14 16  --  15 18 16   CREATININE 0.82 0.69  --  0.54 0.57 0.65  CALCIUM 9.0 8.7  --  8.0* 7.3* 7.9*   Liver Function Tests:  Recent Labs Lab 08/01/13 1554 08/02/13 0450  AST 27 31  ALT 22 20  ALKPHOS 132* 124*  BILITOT 0.7 0.9  PROT 7.1 6.5  ALBUMIN 3.7 3.4*   No results found  for this basename: LIPASE, AMYLASE,  in the last 168 hours No results found for this basename: AMMONIA,  in the last 168 hours CBC:  Recent Labs Lab 08/01/13 1554  08/03/13 0357 08/03/13 1739 08/03/13 2000 08/04/13 0430 08/05/13 0510 08/06/13 0500  WBC 11.3*  < > 14.3*  --  13.6* 9.9 7.9 6.9  NEUTROABS 8.9*  --   --   --   --   --   --   --   HGB 13.2  < > 12.3* 11.2* 10.4* 9.0* 8.5* 8.2*  HCT 39.9  < > 37.2* 33.0* 31.4* 26.3* 25.6* 23.9*  MCV 94.1  < > 93.9  --  93.5 93.3 93.8 91.6  PLT 215  < > 163  --  146* 125* 125* 143*  < > = values in this interval not displayed. Cardiac Enzymes:  Recent Labs Lab 08/01/13 1529  TROPONINI <0.30   BNP (last 3 results)  Recent Labs  07/31/13 1842  PROBNP 1679.0*   CBG:  Recent Labs Lab 08/04/13 0006 08/04/13 0417 08/04/13 0824 08/04/13 1145 08/04/13 1751  GLUCAP 141* 125* 129* 92 114*    Recent Results (from the past 240 hour(s))  MRSA PCR  SCREENING     Status: None   Collection Time    08/02/13  2:06 AM      Result Value Ref Range Status   MRSA by PCR NEGATIVE  NEGATIVE Final   Comment:            The GeneXpert MRSA Assay (FDA     approved for NASAL specimens     only), is one component of a     comprehensive MRSA colonization     surveillance program. It is not     intended to diagnose MRSA     infection nor to guide or     monitor treatment for     MRSA infections.     Studies: Dg Swallowing Func-speech Pathology  08/06/2013   Katherene Ponto [place], CCC-SLP     08/06/2013 10:58 AM Objective Swallowing Evaluation: Modified Barium Swallowing Study   Patient Details  Name: [name] MRN: [id number] Date of Birth: 08-17-1923  Today's Date: 08/06/2013 Time: 9702-6378 SLP Time Calculation (min): 15 min  Past Medical History:  Past Medical History  Diagnosis Date  . Atrial fibrillation   . AAA (abdominal aortic aneurysm)   . Carotid stenosis   . Hypercholesteremia   . Cardiomyopathy     improved  . Renal artery  stenosis   . COPD (chronic obstructive pulmonary disease)   . Osteoarthritis   . Atrial flutter     s/p ablation  . Hip fracture   . DEMENTIA   . Hypertension   . Macular degeneration disease   . Prostate cancer    Past Surgical History:  Past Surgical History  Procedure Laterality Date  . Appendectomy    . Cholecystectomy    . Tonsillectomy    . Intramedullary (im) nail intertrochanteric Right 08/03/2013    Procedure: INTRAMEDULLARY (IM) NAIL INTERTROCHANTRIC;  Surgeon:  Mauri Pole, MD;  Location: [place];  Service: Orthopedics;   Laterality: Right;   HPI:  80 y.o. male with a past medical history of atrial fibrillation,  HTN, AAA, COPD, prostate cancer, history of a CVA, history of  dementia, who lives with his daughter. Cough over past few weeks  MD note documented fever up to 103F.   Pt. admitted after fall  with resultant right hip fractureHistory of dysphagia and used to  be on a special diet, but not anymore according to the daughter.  Pt. has had 4 previous MBS' with Dys 3, nectar for past 3  (09/18/10). CXR 5/17 revealed interval worsening aeration at the  left base compatible with developing left lower lobe atelectasis.  Superimposed airspace consolidation from infection or recent  aspiration is not excluded.  Chest CT revealed soft tissue  effaces the left lower lobe bronchus suggesting mucus  plugging/aspiration, with bronchial wall thickening/bronchitis  within the left upper lobe and lingula. Pt assessed by SLP on  5/18l, determined to need objective study, but not ready.  Underwent ORIF on 5/19, remained intubated until 5/20. New orders  received.      Assessment / Plan / Recommendation Clinical Impression  Dysphagia Diagnosis: Mild oral phase dysphagia;Severe pharyngeal  phase dysphagia Clinical impression: Pt demonstrates a moderate oropharyngeal  dysphagia due to motor and structural deficits. Pt with slow oral  transit and pooling of liquids in the anterior sulcus. Delayed  swallow initiation and  pooling of liquids in valleculae and  pyrifoms leads to penetration during and after the swallow.  Epiglottic deflection is severely limited by weak hyoid  excursion, base  of tongue and appearance of osteophytes on  cervical spine, blocking movement of epiglottis to protect  airway. When the pt is encouraged to put chin down (full tuck  never achieved) the valleculae opens to capture most of the bolus  and the epiglottis moves more freely to allow better bolus  passage. The pt is recommended to consume pureed aolids and Honey  thick liquids via teaspoon, alternating between the two with  multiple swallow and chin down. Will follow for upgrade at  bedside as pt senses penetration with a throat clear.     Treatment Recommendation  Therapy as outlined in treatment plan below    Diet Recommendation Dysphagia 1 (Puree);Honey-thick liquid   Liquid Administration via: Spoon Medication Administration: Crushed with puree Supervision: Staff to assist with self feeding Compensations: Slow rate;Small sips/bites;Check for anterior  loss;Multiple dry swallows after each bite/sip;Follow solids with  liquid Postural Changes and/or Swallow Maneuvers: Seated upright 90  degrees;Upright 30-60 min after meal;Chin tuck    Other  Recommendations Oral Care Recommendations: Oral care BID Other Recommendations: Order thickener from pharmacy   Follow Up Recommendations  Skilled Nursing facility    Frequency and Duration min 2x/week  2 weeks   Pertinent Vitals/Pain NA    SLP Swallow Goals     General HPI: 80 y.o. male with a past medical history of atrial  fibrillation, HTN, AAA, COPD, prostate cancer, history of a CVA,  history of dementia, who lives with his daughter. Cough over past  few weeks MD note documented fever up to 103F.   Pt. admitted  after fall with resultant right hip fractureHistory of dysphagia  and used to be on a special diet, but not anymore according to  the daughter. Pt. has had 4 previous MBS' with Dys 3, nectar for   past 3 (09/18/10). CXR 5/17 revealed interval worsening aeration at  the left base compatible with developing left lower lobe  atelectasis. Superimposed airspace consolidation from infection  or recent aspiration is not excluded.  Chest CT revealed soft  tissue effaces the left lower lobe bronchus suggesting mucus  plugging/aspiration, with bronchial wall thickening/bronchitis  within the left upper lobe and lingula. Pt assessed by SLP on  5/18l, determined to need objective study, but not ready.  Underwent ORIF on 5/19, remained intubated until 5/20. New orders  received.  Type of Study: Modified Barium Swallowing Study Reason for Referral: Objectively evaluate swallowing function Previous Swallow Assessment: 5/18 - NPO and 4 prior MBS  Diet Prior to this Study: NPO Temperature Spikes Noted: No Respiratory Status: Nasal cannula History of Recent Intubation: Yes Length of Intubations (days): 2 days Date extubated: 08/04/13 Behavior/Cognition: Requires cueing;Alert;Cooperative;Pleasant  mood Oral Cavity - Dentition: Dentures, not available Oral Motor / Sensory Function: Within functional limits Self-Feeding Abilities: Total assist Patient Positioning: Upright in chair Baseline Vocal Quality: Low vocal intensity;Wet Volitional Cough: Weak;Congested Volitional Swallow: Able to elicit Anatomy:  (appearance of osteophytes on cervical spine, no  radiologist ) Pharyngeal Secretions: Not observed secondary MBS    Reason for Referral Objectively evaluate swallowing function   Oral Phase Oral Preparation/Oral Phase Oral Phase: Impaired Oral - Honey Oral - Honey Teaspoon: Delayed oral transit;Pocketing in anterior  sulcus Oral - Nectar Oral - Nectar Teaspoon: Delayed oral transit;Pocketing in  anterior sulcus Oral - Thin Oral - Thin Cup: Pocketing in anterior sulcus;Delayed oral  transit Oral - Solids Oral - Puree: Delayed oral transit   Pharyngeal Phase Pharyngeal Phase Pharyngeal Phase: Impaired Pharyngeal - Honey Pharyngeal -  Honey Teaspoon: Delayed swallow initiation;Premature  spillage to valleculae;Reduced epiglottic inversion;Reduced  tongue base retraction;Pharyngeal residue - valleculae;Reduced  anterior laryngeal mobility Pharyngeal - Nectar Pharyngeal - Nectar Teaspoon: Delayed swallow  initiation;Premature spillage to valleculae;Reduced epiglottic  inversion;Reduced tongue base retraction;Pharyngeal residue -  valleculae;Penetration/Aspiration during  swallow;Penetration/Aspiration after swallow;Premature spillage  to pyriform sinuses;Trace aspiration;Reduced anterior laryngeal  mobility Penetration/Aspiration details (nectar teaspoon): Material enters  airway, CONTACTS cords and not ejected out Pharyngeal - Thin Pharyngeal - Thin Cup: Delayed swallow initiation;Premature  spillage to valleculae;Reduced epiglottic inversion;Reduced  tongue base retraction;Pharyngeal residue -  valleculae;Penetration/Aspiration during  swallow;Penetration/Aspiration after swallow;Premature spillage  to pyriform sinuses;Trace aspiration;Reduced anterior laryngeal  mobility Penetration/Aspiration details (thin cup): Material enters  airway, CONTACTS cords and not ejected out Pharyngeal - Solids Pharyngeal - Puree: Delayed swallow initiation;Premature spillage  to valleculae;Reduced epiglottic inversion;Reduced anterior  laryngeal mobility;Reduced tongue base retraction;Pharyngeal  residue - valleculae  Cervical Esophageal Phase    GO             Herbie Baltimore, MA CCC-SLP [phone]  Katherene Ponto Deblois 08/06/2013, 10:56 AM     Scheduled Meds: . antiseptic oral rinse  15 mL Mouth Rinse QID  . aspirin EC  81 mg Oral QPM  . chlorhexidine  15 mL Mouth Rinse BID  . diltiazem  60 mg Oral Q12H  . enoxaparin (LOVENOX) injection  40 mg Subcutaneous Q24H  . feeding supplement (ENSURE)  1 Container Oral TID BM  . metoprolol  25 mg Oral BID  . sodium chloride  3 mL Intravenous Q12H   Continuous Infusions: . dextrose 5 % and 0.45 % NaCl with KCl  20 mEq/L 75 mL/hr at 08/06/13 1356    Time spent: 25 minutes  Delfina Redwood, MD  [facility] Pager [phone]. If 7PM-7AM, please contact night-coverage at [email or web address], password [facility] 08/06/2013, 4:01 PM  LOS: 5 days

## 2013-08-06 NOTE — Progress Notes (Signed)
Daughter at bedside, daughter advised about need for thick liquid and special diet, daughter stated "ive learned with my daddy to do what i want to do", emphasized the importance of following liquid and diet restrictions for patient safety, will continue to emphasize importance of diet order and compliance Rickard Rhymes, RN

## 2013-08-06 NOTE — Procedures (Signed)
Objective Swallowing Evaluation: Modified Barium Swallowing Study  Patient Details  Name: [name] MRN: [id number] Date of Birth: 27-Mar-1923  Today's Date: 08/06/2013 Time: 9983-3825 SLP Time Calculation (min): 15 min  Past Medical History:  Past Medical History  Diagnosis Date  . Atrial fibrillation   . AAA (abdominal aortic aneurysm)   . Carotid stenosis   . Hypercholesteremia   . Cardiomyopathy     improved  . Renal artery stenosis   . COPD (chronic obstructive pulmonary disease)   . Osteoarthritis   . Atrial flutter     s/p ablation  . Hip fracture   . DEMENTIA   . Hypertension   . Macular degeneration disease   . Prostate cancer    Past Surgical History:  Past Surgical History  Procedure Laterality Date  . Appendectomy    . Cholecystectomy    . Tonsillectomy    . Intramedullary (im) nail intertrochanteric Right 08/03/2013    Procedure: INTRAMEDULLARY (IM) NAIL INTERTROCHANTRIC;  Surgeon: Mauri Pole, MD;  Location: [place];  Service: Orthopedics;  Laterality: Right;   HPI:  80 y.o. male with a past medical history of atrial fibrillation, HTN, AAA, COPD, prostate cancer, history of a CVA, history of dementia, who lives with his daughter. Cough over past few weeks MD note documented fever up to 103F.   Pt. admitted after fall with resultant right hip fractureHistory of dysphagia and used to be on a special diet, but not anymore according to the daughter. Pt. has had 4 previous MBS' with Dys 3, nectar for past 3 (09/18/10). CXR 5/17 revealed interval worsening aeration at the left base compatible with developing left lower lobe atelectasis. Superimposed airspace consolidation from infection or recent aspiration is not excluded.  Chest CT revealed soft tissue effaces the left lower lobe bronchus suggesting mucus plugging/aspiration, with bronchial wall thickening/bronchitis within the left upper lobe and lingula. Pt assessed by SLP on 5/18l, determined to need objective  study, but not ready. Underwent ORIF on 5/19, remained intubated until 5/20. New orders received.      Assessment / Plan / Recommendation Clinical Impression  Dysphagia Diagnosis: Mild oral phase dysphagia;Severe pharyngeal phase dysphagia Clinical impression: Pt demonstrates a moderate oropharyngeal dysphagia due to motor and structural deficits. Pt with slow oral transit and pooling of liquids in the anterior sulcus. Delayed swallow initiation and pooling of liquids in valleculae and pyrifoms leads to penetration during and after the swallow. Epiglottic deflection is severely limited by weak hyoid excursion, base of tongue and appearance of osteophytes on cervical spine, blocking movement of epiglottis to protect airway. When the pt is encouraged to put chin down (full tuck never achieved) the valleculae opens to capture most of the bolus and the epiglottis moves more freely to allow better bolus passage. The pt is recommended to consume pureed aolids and Honey thick liquids via teaspoon, alternating between the two with multiple swallow and chin down. Will follow for upgrade at bedside as pt senses penetration with a throat clear.     Treatment Recommendation  Therapy as outlined in treatment plan below    Diet Recommendation Dysphagia 1 (Puree);Honey-thick liquid   Liquid Administration via: Spoon Medication Administration: Crushed with puree Supervision: Staff to assist with self feeding Compensations: Slow rate;Small sips/bites;Check for anterior loss;Multiple dry swallows after each bite/sip;Follow solids with liquid Postural Changes and/or Swallow Maneuvers: Seated upright 90 degrees;Upright 30-60 min after meal;Chin tuck    Other  Recommendations Oral Care Recommendations: Oral care BID Other Recommendations:  Order thickener from pharmacy   Follow Up Recommendations  Skilled Nursing facility    Frequency and Duration min 2x/week  2 weeks   Pertinent Vitals/Pain NA    SLP  Swallow Goals     General HPI: 80 y.o. male with a past medical history of atrial fibrillation, HTN, AAA, COPD, prostate cancer, history of a CVA, history of dementia, who lives with his daughter. Cough over past few weeks MD note documented fever up to 103F.   Pt. admitted after fall with resultant right hip fractureHistory of dysphagia and used to be on a special diet, but not anymore according to the daughter. Pt. has had 4 previous MBS' with Dys 3, nectar for past 3 (09/18/10). CXR 5/17 revealed interval worsening aeration at the left base compatible with developing left lower lobe atelectasis. Superimposed airspace consolidation from infection or recent aspiration is not excluded.  Chest CT revealed soft tissue effaces the left lower lobe bronchus suggesting mucus plugging/aspiration, with bronchial wall thickening/bronchitis within the left upper lobe and lingula. Pt assessed by SLP on 5/18l, determined to need objective study, but not ready. Underwent ORIF on 5/19, remained intubated until 5/20. New orders received.  Type of Study: Modified Barium Swallowing Study Reason for Referral: Objectively evaluate swallowing function Previous Swallow Assessment: 5/18 - NPO and 4 prior MBS  Diet Prior to this Study: NPO Temperature Spikes Noted: No Respiratory Status: Nasal cannula History of Recent Intubation: Yes Length of Intubations (days): 2 days Date extubated: 08/04/13 Behavior/Cognition: Requires cueing;Alert;Cooperative;Pleasant mood Oral Cavity - Dentition: Dentures, not available Oral Motor / Sensory Function: Within functional limits Self-Feeding Abilities: Total assist Patient Positioning: Upright in chair Baseline Vocal Quality: Low vocal intensity;Wet Volitional Cough: Weak;Congested Volitional Swallow: Able to elicit Anatomy:  (appearance of osteophytes on cervical spine, no radiologist ) Pharyngeal Secretions: Not observed secondary MBS    Reason for Referral Objectively  evaluate swallowing function   Oral Phase Oral Preparation/Oral Phase Oral Phase: Impaired Oral - Honey Oral - Honey Teaspoon: Delayed oral transit;Pocketing in anterior sulcus Oral - Nectar Oral - Nectar Teaspoon: Delayed oral transit;Pocketing in anterior sulcus Oral - Thin Oral - Thin Cup: Pocketing in anterior sulcus;Delayed oral transit Oral - Solids Oral - Puree: Delayed oral transit   Pharyngeal Phase Pharyngeal Phase Pharyngeal Phase: Impaired Pharyngeal - Honey Pharyngeal - Honey Teaspoon: Delayed swallow initiation;Premature spillage to valleculae;Reduced epiglottic inversion;Reduced tongue base retraction;Pharyngeal residue - valleculae;Reduced anterior laryngeal mobility Pharyngeal - Nectar Pharyngeal - Nectar Teaspoon: Delayed swallow initiation;Premature spillage to valleculae;Reduced epiglottic inversion;Reduced tongue base retraction;Pharyngeal residue - valleculae;Penetration/Aspiration during swallow;Penetration/Aspiration after swallow;Premature spillage to pyriform sinuses;Trace aspiration;Reduced anterior laryngeal mobility Penetration/Aspiration details (nectar teaspoon): Material enters airway, CONTACTS cords and not ejected out Pharyngeal - Thin Pharyngeal - Thin Cup: Delayed swallow initiation;Premature spillage to valleculae;Reduced epiglottic inversion;Reduced tongue base retraction;Pharyngeal residue - valleculae;Penetration/Aspiration during swallow;Penetration/Aspiration after swallow;Premature spillage to pyriform sinuses;Trace aspiration;Reduced anterior laryngeal mobility Penetration/Aspiration details (thin cup): Material enters airway, CONTACTS cords and not ejected out Pharyngeal - Solids Pharyngeal - Puree: Delayed swallow initiation;Premature spillage to valleculae;Reduced epiglottic inversion;Reduced anterior laryngeal mobility;Reduced tongue base retraction;Pharyngeal residue - valleculae  Cervical Esophageal Phase    GO             Herbie Baltimore, MA CCC-SLP [phone]  Katherene Ponto Male [place] 08/06/2013, 10:56 AM

## 2013-08-07 ENCOUNTER — Inpatient Hospital Stay (HOSPITAL_COMMUNITY): Payer: Medicare Other

## 2013-08-07 DIAGNOSIS — E43 Unspecified severe protein-calorie malnutrition: Secondary | ICD-10-CM | POA: Diagnosis present

## 2013-08-07 DIAGNOSIS — T17908A Unspecified foreign body in respiratory tract, part unspecified causing other injury, initial encounter: Secondary | ICD-10-CM | POA: Diagnosis present

## 2013-08-07 DIAGNOSIS — S32511A Fracture of superior rim of right pubis, initial encounter for closed fracture: Secondary | ICD-10-CM | POA: Diagnosis present

## 2013-08-07 LAB — CBC
HCT: 24.2 % — ABNORMAL LOW (ref 39.0–52.0)
HEMOGLOBIN: 8.7 g/dL — AB (ref 13.0–17.0)
MCH: 32.5 pg (ref 26.0–34.0)
MCHC: 36 g/dL (ref 30.0–36.0)
MCV: 90.3 fL (ref 78.0–100.0)
PLATELETS: 181 10*3/uL (ref 150–400)
RBC: 2.68 MIL/uL — ABNORMAL LOW (ref 4.22–5.81)
RDW: 14.1 % (ref 11.5–15.5)
WBC: 9.9 10*3/uL (ref 4.0–10.5)

## 2013-08-07 LAB — BASIC METABOLIC PANEL
BUN: 6 mg/dL (ref 6–23)
CO2: 27 mEq/L (ref 19–32)
Calcium: 8 mg/dL — ABNORMAL LOW (ref 8.4–10.5)
Chloride: 103 mEq/L (ref 96–112)
Creatinine, Ser: 0.47 mg/dL — ABNORMAL LOW (ref 0.50–1.35)
Glucose, Bld: 132 mg/dL — ABNORMAL HIGH (ref 70–99)
Potassium: 2.5 mEq/L — CL (ref 3.7–5.3)
SODIUM: 140 meq/L (ref 137–147)

## 2013-08-07 LAB — MAGNESIUM: MAGNESIUM: 1.7 mg/dL (ref 1.5–2.5)

## 2013-08-07 LAB — PRO B NATRIURETIC PEPTIDE: PRO B NATRI PEPTIDE: 8854 pg/mL — AB (ref 0–450)

## 2013-08-07 MED ORDER — DILTIAZEM HCL 60 MG PO TABS
60.0000 mg | ORAL_TABLET | Freq: Four times a day (QID) | ORAL | Status: DC
  Filled 2013-08-07 (×3): qty 1

## 2013-08-07 MED ORDER — POTASSIUM CHLORIDE 10 MEQ/100ML IV SOLN
10.0000 meq | INTRAVENOUS | Status: AC
  Administered 2013-08-07 (×4): 10 meq via INTRAVENOUS
  Filled 2013-08-07 (×4): qty 100

## 2013-08-07 MED ORDER — ACETYLCYSTEINE 10 % IN SOLN
4.0000 mL | RESPIRATORY_TRACT | Status: DC
  Filled 2013-08-07 (×5): qty 4

## 2013-08-07 MED ORDER — SODIUM CHLORIDE 0.9 % IV SOLN
3.0000 g | Freq: Four times a day (QID) | INTRAVENOUS | Status: DC
  Administered 2013-08-07 – 2013-08-09 (×7): 3 g via INTRAVENOUS
  Filled 2013-08-07 (×9): qty 3

## 2013-08-07 MED ORDER — LEVALBUTEROL HCL 0.63 MG/3ML IN NEBU
0.6300 mg | INHALATION_SOLUTION | RESPIRATORY_TRACT | Status: DC
  Administered 2013-08-07: 0.63 mg via RESPIRATORY_TRACT
  Filled 2013-08-07: qty 3

## 2013-08-07 MED ORDER — METOPROLOL TARTRATE 1 MG/ML IV SOLN
5.0000 mg | Freq: Four times a day (QID) | INTRAVENOUS | Status: DC
  Administered 2013-08-07 – 2013-08-08 (×4): 5 mg via INTRAVENOUS
  Filled 2013-08-07 (×7): qty 5

## 2013-08-07 MED ORDER — ACETYLCYSTEINE 20 % IN SOLN
4.0000 mL | Freq: Four times a day (QID) | RESPIRATORY_TRACT | Status: DC
  Administered 2013-08-07 – 2013-08-09 (×6): 4 mL via RESPIRATORY_TRACT
  Filled 2013-08-07 (×11): qty 4

## 2013-08-07 NOTE — Progress Notes (Signed)
RN reports increasing chest congestion. Will check CXR r/o aspiration.  Doree Barthel, MD

## 2013-08-07 NOTE — Progress Notes (Addendum)
Chart reviewed.   [facility] PROGRESS NOTE  [name] [id number] DOB: 03/18/1924 DOA: 08/01/2013 PCP: [name], MD  Summary: 80 year old male, former smoker, with PAF and dementia fell at home 5/17. In ED he was found to have R hip fracture and presumed URI. He was admitted and underwent ORIF of R hip 5/19. Postoperatively he remained intubated,  transferred to ICU, PCCM managed care. Transferred back to [facility] 5/22  Assessment/Plan:  Principal Problem:   Hip fracture, right Active Problems:   HYPERTENSION   FIBRILLATION, ATRIAL: rate around 110. Will change cardizem to q6h   ANEURYSM, ABDOMINAL AORTIC   COPD   History of prostate cancer   Dementia   Acute respiratory failure with hypoxia resolved   Metabolic encephalopathy   Hypotension resolved Hypokalemia: Replete. Magnesium okay. anemia: stable Right superior pubic ramus fracture Protein calorie malnutrition Dysphagia: on D1 diet with thickened liquids  D/c right IJ CVL  Code Status:  DNR Family Communication:  Daughter and SIL at bedside Disposition Plan:  SNF  Consults: PCCM, ortho  Procedures: ORIF right hip Right IJ CVL  HPI/Subjective: Per daughter, up all night, so sleepy today  Objective: Filed Vitals:   08/07/13 0507  BP: 140/71  Pulse: 113  Temp: 97.4 F (36.3 C)  Resp: 18    Intake/Output Summary (Last 24 hours) at 08/07/13 1200 Last data filed at 08/07/13 0630  Gross per 24 hour  Intake    120 ml  Output   1850 ml  Net  -1730 ml   Filed Weights   08/01/13 1532 08/02/13 0401 08/04/13 0551  Weight: 64.411 kg (142 lb) 56.6 kg (124 lb 12.5 oz) 58.3 kg (128 lb 8.5 oz)    Exam:   General:  Alert, thin, groggy. arousable  HEENT: less dry MM. edentulous  Cardiovascular: irreg irreg  Respiratory: CTA without WRR  Abdomen: S, NT, ND  Ext: no CCE. Hip dressing CDI  Basic Metabolic Panel:  Recent Labs Lab 08/03/13 0357 08/03/13 1739 08/03/13 2000  08/04/13 0430 08/05/13 0510 08/07/13 0535  NA 141 137 138 137 140 140  K 4.1 3.7 4.4 3.8 3.4* 2.5*  CL 105  --  103 106 107 103  CO2 25  --  24 21 22 27   GLUCOSE 113*  --  140* 133* 97 132*  BUN 16  --  15 18 16 6   CREATININE 0.69  --  0.54 0.57 0.65 0.47*  CALCIUM 8.7  --  8.0* 7.3* 7.9* 8.0*  MG  --   --   --   --   --  1.7   Liver Function Tests:  Recent Labs Lab 08/01/13 1554 08/02/13 0450  AST 27 31  ALT 22 20  ALKPHOS 132* 124*  BILITOT 0.7 0.9  PROT 7.1 6.5  ALBUMIN 3.7 3.4*   No results found for this basename: LIPASE, AMYLASE,  in the last 168 hours No results found for this basename: AMMONIA,  in the last 168 hours CBC:  Recent Labs Lab 08/01/13 1554  08/03/13 2000 08/04/13 0430 08/05/13 0510 08/06/13 0500 08/07/13 0535  WBC 11.3*  < > 13.6* 9.9 7.9 6.9 9.9  NEUTROABS 8.9*  --   --   --   --   --   --   HGB 13.2  < > 10.4* 9.0* 8.5* 8.2* 8.7*  HCT 39.9  < > 31.4* 26.3* 25.6* 23.9* 24.2*  MCV 94.1  < > 93.5 93.3 93.8 91.6 90.3  PLT 215  < >  146* 125* 125* 143* 181  < > = values in this interval not displayed. Cardiac Enzymes:  Recent Labs Lab 08/01/13 1529  TROPONINI <0.30   BNP (last 3 results)  Recent Labs  07/31/13 1842  PROBNP 1679.0*   CBG:  Recent Labs Lab 08/04/13 0006 08/04/13 0417 08/04/13 0824 08/04/13 1145 08/04/13 1751  GLUCAP 141* 125* 129* 92 114*    Recent Results (from the past 240 hour(s))  MRSA PCR SCREENING     Status: None   Collection Time    08/02/13  2:06 AM      Result Value Ref Range Status   MRSA by PCR NEGATIVE  NEGATIVE Final   Comment:            The GeneXpert MRSA Assay (FDA     approved for NASAL specimens     only), is one component of a     comprehensive MRSA colonization     surveillance program. It is not     intended to diagnose MRSA     infection nor to guide or     monitor treatment for     MRSA infections.     Studies: Dg Swallowing Func-speech Pathology  08/06/2013   Katherene Ponto Deblois, CCC-SLP     08/06/2013 10:58 AM Objective Swallowing Evaluation: Modified Barium Swallowing Study   Patient Details  Name: [name] MRN: [id number] Date of Birth: May 26, 1923  Today's Date: 08/06/2013 Time: 1884-1660 SLP Time Calculation (min): 15 min  Past Medical History:  Past Medical History  Diagnosis Date  . Atrial fibrillation   . AAA (abdominal aortic aneurysm)   . Carotid stenosis   . Hypercholesteremia   . Cardiomyopathy     improved  . Renal artery stenosis   . COPD (chronic obstructive pulmonary disease)   . Osteoarthritis   . Atrial flutter     s/p ablation  . Hip fracture   . DEMENTIA   . Hypertension   . Macular degeneration disease   . Prostate cancer    Past Surgical History:  Past Surgical History  Procedure Laterality Date  . Appendectomy    . Cholecystectomy    . Tonsillectomy    . Intramedullary (im) nail intertrochanteric Right 08/03/2013    Procedure: INTRAMEDULLARY (IM) NAIL INTERTROCHANTRIC;  Surgeon:  Mauri Pole, MD;  Location: [facility];  Service: Orthopedics;   Laterality: Right;   HPI:  80 y.o. male with a past medical history of atrial fibrillation,  HTN, AAA, COPD, prostate cancer, history of a CVA, history of  dementia, who lives with his daughter. Cough over past few weeks  MD note documented fever up to 103F.   Pt. admitted after fall  with resultant right hip fractureHistory of dysphagia and used to  be on a special diet, but not anymore according to the daughter.  Pt. has had 4 previous MBS' with Dys 3, nectar for past 3  (09/18/10). CXR 5/17 revealed interval worsening aeration at the  left base compatible with developing left lower lobe atelectasis.  Superimposed airspace consolidation from infection or recent  aspiration is not excluded.  Chest CT revealed soft tissue  effaces the left lower lobe bronchus suggesting mucus  plugging/aspiration, with bronchial wall thickening/bronchitis  within the left upper lobe and lingula. Pt assessed by SLP on  5/18l,  determined to need objective study, but not ready.  Underwent ORIF on 5/19, remained intubated until 5/20. New orders  received.      Assessment /  Plan / Recommendation Clinical Impression  Dysphagia Diagnosis: Mild oral phase dysphagia;Severe pharyngeal  phase dysphagia Clinical impression: Pt demonstrates a moderate oropharyngeal  dysphagia due to motor and structural deficits. Pt with slow oral  transit and pooling of liquids in the anterior sulcus. Delayed  swallow initiation and pooling of liquids in valleculae and  pyrifoms leads to penetration during and after the swallow.  Epiglottic deflection is severely limited by weak hyoid  excursion, base of tongue and appearance of osteophytes on  cervical spine, blocking movement of epiglottis to protect  airway. When the pt is encouraged to put chin down (full tuck  never achieved) the valleculae opens to capture most of the bolus  and the epiglottis moves more freely to allow better bolus  passage. The pt is recommended to consume pureed aolids and Honey  thick liquids via teaspoon, alternating between the two with  multiple swallow and chin down. Will follow for upgrade at  bedside as pt senses penetration with a throat clear.     Treatment Recommendation  Therapy as outlined in treatment plan below    Diet Recommendation Dysphagia 1 (Puree);Honey-thick liquid   Liquid Administration via: Spoon Medication Administration: Crushed with puree Supervision: Staff to assist with self feeding Compensations: Slow rate;Small sips/bites;Check for anterior  loss;Multiple dry swallows after each bite/sip;Follow solids with  liquid Postural Changes and/or Swallow Maneuvers: Seated upright 90  degrees;Upright 30-60 min after meal;Chin tuck    Other  Recommendations Oral Care Recommendations: Oral care BID Other Recommendations: Order thickener from pharmacy   Follow Up Recommendations  Skilled Nursing facility    Frequency and Duration min 2x/week  2 weeks   Pertinent  Vitals/Pain NA    SLP Swallow Goals     General HPI: 80 y.o. male with a past medical history of atrial  fibrillation, HTN, AAA, COPD, prostate cancer, history of a CVA,  history of dementia, who lives with his daughter. Cough over past  few weeks MD note documented fever up to 103F.   Pt. admitted  after fall with resultant right hip fractureHistory of dysphagia  and used to be on a special diet, but not anymore according to  the daughter. Pt. has had 4 previous MBS' with Dys 3, nectar for  past 3 (09/18/10). CXR 5/17 revealed interval worsening aeration at  the left base compatible with developing left lower lobe  atelectasis. Superimposed airspace consolidation from infection  or recent aspiration is not excluded.  Chest CT revealed soft  tissue effaces the left lower lobe bronchus suggesting mucus  plugging/aspiration, with bronchial wall thickening/bronchitis  within the left upper lobe and lingula. Pt assessed by SLP on  5/18l, determined to need objective study, but not ready.  Underwent ORIF on 5/19, remained intubated until 5/20. New orders  received.  Type of Study: Modified Barium Swallowing Study Reason for Referral: Objectively evaluate swallowing function Previous Swallow Assessment: 5/18 - NPO and 4 prior MBS  Diet Prior to this Study: NPO Temperature Spikes Noted: No Respiratory Status: Nasal cannula History of Recent Intubation: Yes Length of Intubations (days): 2 days Date extubated: 08/04/13 Behavior/Cognition: Requires cueing;Alert;Cooperative;Pleasant  mood Oral Cavity - Dentition: Dentures, not available Oral Motor / Sensory Function: Within functional limits Self-Feeding Abilities: Total assist Patient Positioning: Upright in chair Baseline Vocal Quality: Low vocal intensity;Wet Volitional Cough: Weak;Congested Volitional Swallow: Able to elicit Anatomy:  (appearance of osteophytes on cervical spine, no  radiologist ) Pharyngeal Secretions: Not observed secondary MBS    Reason for Referral  Objectively evaluate swallowing function   Oral Phase Oral Preparation/Oral Phase Oral Phase: Impaired Oral - Honey Oral - Honey Teaspoon: Delayed oral transit;Pocketing in anterior  sulcus Oral - Nectar Oral - Nectar Teaspoon: Delayed oral transit;Pocketing in  anterior sulcus Oral - Thin Oral - Thin Cup: Pocketing in anterior sulcus;Delayed oral  transit Oral - Solids Oral - Puree: Delayed oral transit   Pharyngeal Phase Pharyngeal Phase Pharyngeal Phase: Impaired Pharyngeal - Honey Pharyngeal - Honey Teaspoon: Delayed swallow initiation;Premature  spillage to valleculae;Reduced epiglottic inversion;Reduced  tongue base retraction;Pharyngeal residue - valleculae;Reduced  anterior laryngeal mobility Pharyngeal - Nectar Pharyngeal - Nectar Teaspoon: Delayed swallow  initiation;Premature spillage to valleculae;Reduced epiglottic  inversion;Reduced tongue base retraction;Pharyngeal residue -  valleculae;Penetration/Aspiration during  swallow;Penetration/Aspiration after swallow;Premature spillage  to pyriform sinuses;Trace aspiration;Reduced anterior laryngeal  mobility Penetration/Aspiration details (nectar teaspoon): Material enters  airway, CONTACTS cords and not ejected out Pharyngeal - Thin Pharyngeal - Thin Cup: Delayed swallow initiation;Premature  spillage to valleculae;Reduced epiglottic inversion;Reduced  tongue base retraction;Pharyngeal residue -  valleculae;Penetration/Aspiration during  swallow;Penetration/Aspiration after swallow;Premature spillage  to pyriform sinuses;Trace aspiration;Reduced anterior laryngeal  mobility Penetration/Aspiration details (thin cup): Material enters  airway, CONTACTS cords and not ejected out Pharyngeal - Solids Pharyngeal - Puree: Delayed swallow initiation;Premature spillage  to valleculae;Reduced epiglottic inversion;Reduced anterior  laryngeal mobility;Reduced tongue base retraction;Pharyngeal  residue - valleculae  Cervical Esophageal Phase    GO             Herbie Baltimore, MA CCC-SLP [phone]  Katherene Ponto Deblois 08/06/2013, 10:56 AM     Scheduled Meds: . antiseptic oral rinse  15 mL Mouth Rinse QID  . aspirin EC  81 mg Oral QPM  . chlorhexidine  15 mL Mouth Rinse BID  . diltiazem  60 mg Oral 4 times per day  . enoxaparin (LOVENOX) injection  40 mg Subcutaneous Q24H  . feeding supplement (ENSURE)  1 Container Oral TID BM  . metoprolol  25 mg Oral BID  . sodium chloride  3 mL Intravenous Q12H   Continuous Infusions: . dextrose 5 % and 0.45 % NaCl with KCl 20 mEq/L 75 mL/hr at 08/07/13 4268    Time spent: 25 minutes  Delfina Redwood, MD  [facility] Pager [phone]. If 7PM-7AM, please contact night-coverage at [email or web address], password [facility] 08/07/2013, 12:00 PM  LOS: 6 days

## 2013-08-07 NOTE — Progress Notes (Addendum)
CXR shows whiteout on left, likely aspiration/collapse. D/w Dr. Aundra Dubin. He rec trial of albuterol/mucomyst/chest PT. Repeat xray. If no improvement, consider bronch. sats ok. Will make strict NPO and give meds IV.  Add Rosilyn Mings, MD

## 2013-08-08 ENCOUNTER — Inpatient Hospital Stay (HOSPITAL_COMMUNITY): Payer: Medicare Other

## 2013-08-08 LAB — BASIC METABOLIC PANEL
BUN: 7 mg/dL (ref 6–23)
CHLORIDE: 99 meq/L (ref 96–112)
CO2: 29 mEq/L (ref 19–32)
Calcium: 7.8 mg/dL — ABNORMAL LOW (ref 8.4–10.5)
Creatinine, Ser: 0.49 mg/dL — ABNORMAL LOW (ref 0.50–1.35)
GFR calc non Af Amer: >90 mL/min (ref >90)
Glucose, Bld: 111 mg/dL — ABNORMAL HIGH (ref 70–99)
POTASSIUM: 2.7 meq/L — AB (ref 3.7–5.3)
Sodium: 140 mEq/L (ref 137–147)

## 2013-08-08 MED ORDER — DILTIAZEM HCL 60 MG PO TABS
60.0000 mg | ORAL_TABLET | Freq: Three times a day (TID) | ORAL | Status: DC
  Administered 2013-08-08 – 2013-08-09 (×2): 60 mg via ORAL
  Filled 2013-08-08 (×6): qty 1

## 2013-08-08 MED ORDER — POTASSIUM CHLORIDE 10 MEQ/100ML IV SOLN
10.0000 meq | INTRAVENOUS | Status: AC
  Administered 2013-08-08 (×5): 10 meq via INTRAVENOUS
  Filled 2013-08-08 (×5): qty 100

## 2013-08-08 MED ORDER — SODIUM CHLORIDE 0.9 % IJ SOLN
10.0000 mL | INTRAMUSCULAR | Status: DC | PRN
  Administered 2013-08-08 – 2013-08-11 (×5): 10 mL

## 2013-08-08 MED ORDER — POTASSIUM CHLORIDE CRYS ER 20 MEQ PO TBCR: 40.0000 meq | EXTENDED_RELEASE_TABLET | ORAL | Status: DC

## 2013-08-08 MED ORDER — SODIUM CHLORIDE 0.9 % IJ SOLN
10.0000 mL | Freq: Two times a day (BID) | INTRAMUSCULAR | Status: DC
  Administered 2013-08-08: 10 mL
  Administered 2013-08-09: 20 mL
  Administered 2013-08-10 – 2013-08-12 (×2): 10 mL

## 2013-08-08 MED ORDER — LEVALBUTEROL HCL 0.63 MG/3ML IN NEBU
0.6300 mg | INHALATION_SOLUTION | Freq: Four times a day (QID) | RESPIRATORY_TRACT | Status: DC
  Administered 2013-08-08 – 2013-08-13 (×21): 0.63 mg via RESPIRATORY_TRACT
  Filled 2013-08-08 (×42): qty 3

## 2013-08-08 NOTE — Progress Notes (Addendum)
PULMONARY / CRITICAL CARE MEDICINE   Name: [name] MRN: [id number] DOB: Oct 22, 1923    ADMISSION DATE:  08/01/2013 CONSULTATION DATE:  08/03/2013  REFERRING MD :  Sherral Hammers PRIMARY SERVICE: PCCM  CHIEF COMPLAINT:  Shock  BRIEF PATIENT DESCRIPTION: 80 year old male, former smoker,  with PAF and dementia fell at home 5/17. In ED he was found to have R hip fracture and presumed URI. He was admitted and underwent ORIF of R hip 5/19. Postoperatively he remained intubated was managed in ICU.  Improved, extubated and out to floor.  PCCM reconsulted 5/24 r/t complete white out L hemithorax.   SIGNIFICANT EVENTS / STUDIES:  5/17 - CT spine > No acute fracture, No acute intracranial abnormality, Cervical spondylosis without fracture 5/17 - R hip XR > Acute right intertrochanteric fracture, with separate lesser trochanteric fragment. 5/17 - CT abd/pelvis -  intertrochanteric right hip fracture, Superior pubic ramus fracture on the right, Suspicious for Ileus. Slight increase in size of infrarenal AAA 5/18 - CTA chest >Negative for PE, nodule LLL, "severe COPD" 5/19 - ORIF R hip - On vent and phenylephrine post-op 5/20 Extubated. Vasopressors weaned to off 521 Echocardiogram:  5/21 transfer to tele. 5/24 PCCM reconsulted for L hemithorax atx/collapse   LINES / TUBES: ETT 5/19 >> 5/20  RIJ CVL 5/19 >>   CULTURES: none   ANTIBIOTICS: levaquin 5/17-5/19 zosyn 5/19 >> 5/20 Unasyn 5/23>>>   SUBJECTIVE:  Pleasantly confused, No distress.  Denies CP, dyspnea  VITAL SIGNS: Temp:  [97.5 F (36.4 C)-98 F (36.7 C)] 97.8 F (36.6 C) (05/24 0411) Pulse Rate:  [70-100] 87 (05/24 0411) Resp:  [19-37] 20 (05/24 0411) BP: (136-140)/(73-99) 140/99 mmHg (05/24 0411) SpO2:  [93 %-100 %] 96 % (05/24 0836) HEMODYNAMICS:   VENTILATOR SETTINGS:   INTAKE / OUTPUT: Intake/Output     05/23 0701 - 05/24 0700 05/24 0701 - 05/25 0700   P.O. 0 0   Total Intake(mL/kg)     Urine (mL/kg/hr) 3500 (2.5)     Total Output 3500     Net -3500 0          PHYSICAL EXAMINATION: General: elderly, cachectic, NAD Neuro:  No focal deficits, pleasantly confused, oriented x 2 (name & place) HEENT: WNL Cardiovascular: IRIR, rate controlled Lungs:  Decreased BS on left Abdomen:  Soft, non tender Ext: cool, no edema   LABS:  Recent Labs Lab 08/05/13 0510 08/06/13 0500 08/07/13 0535  HGB 8.5* 8.2* 8.7*  HCT 25.6* 23.9* 24.2*  WBC 7.9 6.9 9.9  PLT 125* 143* 181    Recent Labs Lab 08/03/13 2000 08/04/13 0430 08/05/13 0510 08/07/13 0535 08/08/13 0500  NA 138 137 140 140 140  K 4.4 3.8 3.4* 2.5* 2.7*  CL 103 106 107 103 99  CO2 24 21 22 27 29   GLUCOSE 140* 133* 97 132* 111*  BUN 15 18 16 6 7   CREATININE 0.54 0.57 0.65 0.47* 0.49*  CALCIUM 8.0* 7.3* 7.9* 8.0* 7.8*  MG  --   --   --  1.7  --     Recent Labs Lab 08/01/13 1554 08/02/13 0450  AST 27 31  ALT 22 20  ALKPHOS 132* 124*  BILITOT 0.7 0.9  PROT 7.1 6.5  ALBUMIN 3.7 3.4*  INR 1.04  --      CXR:  Dg Chest Port 1 View  08/07/2013   CLINICAL DATA:  Rhonchi.  Dysphagia.  Evaluate for aspiration.  EXAM: PORTABLE CHEST - 1 VIEW  COMPARISON:  08/04/2013 and 08/03/2013.  FINDINGS: 1632 hr. Interval extubation with removal of the nasogastric tube. A right IJ central venous catheter appears unchanged at the mid SVC level. There is new near complete opacification of the left hemithorax with volume loss and mediastinal shift to the left. The left upper lobe remains minimally aerated. The right lung has a stable appearance with calcified granulomas. No pneumothorax is evident.  IMPRESSION: New near-complete opacification of the left hemithorax with volume loss, most consistent with mucous plugging. Acute aspiration cannot be excluded.  These results were called by telephone at the time of interpretation on 08/07/2013 at 5:23 PM to the patient's nurse, [name] , who verbally acknowledged these results.   Electronically Signed   By:  Camie Patience M.D.   On: 08/07/2013 17:24     ASSESSMENT / PLAN:   Acute Respiratory Failure - resolved.  Mucous plugging, L hemithorax collapse - no improvement thus far with conservative measures  P:   Cont supp O2 Cont mucomyst, chest PT via vest Discussed with family possibility of FOB - would likely need tx to ICU for this  F/u CXR in am Cont empiric abx as above  xopenex q6h   S/p ORIF R hip fx P:   Per ortho  I do not think [name] can make an informed decision about bronchoscopy. I discussed options with pts daughter & son-in law including the possibility of worsening hypoxia during & after bronchoscopy with conscious sedation, they would like to continue conservative measures for now. Keep npo  *Care during the described time interval was provided by me and/or other providers on the critical care team. I have reviewed this patient's available data, including medical history, events of note, physical examination and test results as part of my evaluation.   Martin Mead MD. Shade Flood. [place] [facility] Pager [phone] If no response call [phone]    08/08/2013  12:08 PM

## 2013-08-08 NOTE — Progress Notes (Signed)
Clinical Social Worker (CSW) contacted patient's daughter Anda Kraft at [phone] to give bed offers. Daughter chose [facility] in [place]. CSW left with [facility] admissions coordinator. Weekday CSW will follow up.   Blima Rich, [place] Weekend CSW [phone]

## 2013-08-08 NOTE — Progress Notes (Signed)
[facility] PROGRESS NOTE  [name] VPX:[id number] DOB: 1923-10-19 DOA: 08/01/2013 PCP: [name], MD  Summary: 80 year old male, former smoker, with PAF and dementia fell at home 5/17. In ED he was found to have R hip fracture and presumed URI. He was admitted and underwent ORIF of R hip 5/19. Postoperatively he remained intubated,  transferred to ICU, PCCM managed care. Transferred back to [facility] 5/22  Assessment/Plan:     Hip fracture, right Acute respiratory failure- has a collapsed left lung, pulm consult  Hypokalemia -replete -Mg ok    HYPERTENSION   FIBRILLATION, ATRIAL: rate around 110. Will change cardizem to q6h   ANEURYSM, ABDOMINAL AORTIC   COPD   History of prostate cancer   Dementia    Metabolic encephalopathy   Hypotension resolved  anemia: stable Right superior pubic ramus fracture Protein calorie malnutrition Dysphagia: on D1 diet with thickened liquids- NPO for now    Code Status:  DNR Family Communication:  No family at bedside Disposition Plan:  SNF  Consults: PCCM, ortho  Procedures: ORIF right hip Right IJ CVL  HPI/Subjective: Wants to eat  Objective: Filed Vitals:   08/08/13 0411  BP: 140/99  Pulse: 87  Temp: 97.8 F (36.6 C)  Resp: 20    Intake/Output Summary (Last 24 hours) at 08/08/13 1036 Last data filed at 08/08/13 0846  Gross per 24 hour  Intake      0 ml  Output   3500 ml  Net  -3500 ml   Filed Weights   08/01/13 1532 08/02/13 0401 08/04/13 0551  Weight: 64.411 kg (142 lb) 56.6 kg (124 lb 12.5 oz) 58.3 kg (128 lb 8.5 oz)    Exam:   General:  Alert, thin, groggy. arousable  HEENT: less dry MM. edentulous  Cardiovascular: irreg irreg  Respiratory: decreased left side  Abdomen: S, NT, ND  Ext: no CCE. Hip dressing CDI  Basic Metabolic Panel:  Recent Labs Lab 08/03/13 2000 08/04/13 0430 08/05/13 0510 08/07/13 0535 08/08/13 0500  NA 138 137 140 140 140  K 4.4 3.8 3.4* 2.5*  2.7*  CL 103 106 107 103 99  CO2 24 21 22 27 29   GLUCOSE 140* 133* 97 132* 111*  BUN 15 18 16 6 7   CREATININE 0.54 0.57 0.65 0.47* 0.49*  CALCIUM 8.0* 7.3* 7.9* 8.0* 7.8*  MG  --   --   --  1.7  --    Liver Function Tests:  Recent Labs Lab 08/01/13 1554 08/02/13 0450  AST 27 31  ALT 22 20  ALKPHOS 132* 124*  BILITOT 0.7 0.9  PROT 7.1 6.5  ALBUMIN 3.7 3.4*   No results found for this basename: LIPASE, AMYLASE,  in the last 168 hours No results found for this basename: AMMONIA,  in the last 168 hours CBC:  Recent Labs Lab 08/01/13 1554  08/03/13 2000 08/04/13 0430 08/05/13 0510 08/06/13 0500 08/07/13 0535  WBC 11.3*  < > 13.6* 9.9 7.9 6.9 9.9  NEUTROABS 8.9*  --   --   --   --   --   --   HGB 13.2  < > 10.4* 9.0* 8.5* 8.2* 8.7*  HCT 39.9  < > 31.4* 26.3* 25.6* 23.9* 24.2*  MCV 94.1  < > 93.5 93.3 93.8 91.6 90.3  PLT 215  < > 146* 125* 125* 143* 181  < > = values in this interval not displayed. Cardiac Enzymes:  Recent Labs Lab 08/01/13 1529  TROPONINI <0.30  BNP (last 3 results)  Recent Labs  07/31/13 1842 08/07/13 1833  PROBNP 1679.0* 8854.0*   CBG:  Recent Labs Lab 08/04/13 0006 08/04/13 0417 08/04/13 0824 08/04/13 1145 08/04/13 1751  GLUCAP 141* 125* 129* 92 114*    Recent Results (from the past 240 hour(s))  MRSA PCR SCREENING     Status: None   Collection Time    08/02/13  2:06 AM      Result Value Ref Range Status   MRSA by PCR NEGATIVE  NEGATIVE Final   Comment:            The GeneXpert MRSA Assay (FDA     approved for NASAL specimens     only), is one component of a     comprehensive MRSA colonization     surveillance program. It is not     intended to diagnose MRSA     infection nor to guide or     monitor treatment for     MRSA infections.     Studies: Dg Chest Port 1 View  08/07/2013   CLINICAL DATA:  Rhonchi.  Dysphagia.  Evaluate for aspiration.  EXAM: PORTABLE CHEST - 1 VIEW  COMPARISON:  08/04/2013 and 08/03/2013.   FINDINGS: 1632 hr. Interval extubation with removal of the nasogastric tube. A right IJ central venous catheter appears unchanged at the mid SVC level. There is new near complete opacification of the left hemithorax with volume loss and mediastinal shift to the left. The left upper lobe remains minimally aerated. The right lung has a stable appearance with calcified granulomas. No pneumothorax is evident.  IMPRESSION: New near-complete opacification of the left hemithorax with volume loss, most consistent with mucous plugging. Acute aspiration cannot be excluded.  These results were called by telephone at the time of interpretation on 08/07/2013 at 5:23 PM to the patient's nurse, Landis Martins , who verbally acknowledged these results.   Electronically Signed   By: Camie Patience M.D.   On: 08/07/2013 17:24    Scheduled Meds: . acetylcysteine  4 mL Nebulization Q6H  . ampicillin-sulbactam (UNASYN) IV  3 g Intravenous Q6H  . antiseptic oral rinse  15 mL Mouth Rinse QID  . chlorhexidine  15 mL Mouth Rinse BID  . enoxaparin (LOVENOX) injection  40 mg Subcutaneous Q24H  . levalbuterol  0.63 mg Nebulization Q6H  . metoprolol  5 mg Intravenous 4 times per day  . potassium chloride  10 mEq Intravenous Q1 Hr x 5  . sodium chloride  3 mL Intravenous Q12H   Continuous Infusions:    Time spent: 25 minutes  Geradine Girt, MD  [facility] Pager [phone]. If 7PM-7AM, please contact night-coverage at [email or web address], password [facility] 08/08/2013, 10:36 AM  LOS: 7 days

## 2013-08-09 ENCOUNTER — Inpatient Hospital Stay (HOSPITAL_COMMUNITY): Payer: Medicare Other

## 2013-08-09 DIAGNOSIS — T17908A Unspecified foreign body in respiratory tract, part unspecified causing other injury, initial encounter: Secondary | ICD-10-CM

## 2013-08-09 LAB — CBC
HCT: 27.4 % — ABNORMAL LOW (ref 39.0–52.0)
Hemoglobin: 9.1 g/dL — ABNORMAL LOW (ref 13.0–17.0)
MCH: 31 pg (ref 26.0–34.0)
MCHC: 33.2 g/dL (ref 30.0–36.0)
MCV: 93.2 fL (ref 78.0–100.0)
Platelets: 243 10*3/uL (ref 150–400)
RBC: 2.94 MIL/uL — ABNORMAL LOW (ref 4.22–5.81)
RDW: 14.9 % (ref 11.5–15.5)
WBC: 13 10*3/uL — ABNORMAL HIGH (ref 4.0–10.5)

## 2013-08-09 LAB — BASIC METABOLIC PANEL
BUN: 14 mg/dL (ref 6–23)
CO2: 25 mEq/L (ref 19–32)
Calcium: 7.9 mg/dL — ABNORMAL LOW (ref 8.4–10.5)
Chloride: 104 mEq/L (ref 96–112)
Creatinine, Ser: 0.56 mg/dL (ref 0.50–1.35)
GFR calc Af Amer: >90 mL/min (ref >90)
GFR calc non Af Amer: >90 mL/min (ref >90)
Glucose, Bld: 102 mg/dL — ABNORMAL HIGH (ref 70–99)
Potassium: 3.6 mEq/L — ABNORMAL LOW (ref 3.7–5.3)
Sodium: 142 mEq/L (ref 137–147)

## 2013-08-09 MED ORDER — PIPERACILLIN-TAZOBACTAM 3.375 G IVPB
3.3750 g | Freq: Three times a day (TID) | INTRAVENOUS | Status: DC
  Administered 2013-08-09 – 2013-08-13 (×13): 3.375 g via INTRAVENOUS
  Filled 2013-08-09 (×16): qty 50

## 2013-08-09 MED ORDER — DILTIAZEM HCL 60 MG PO TABS
60.0000 mg | ORAL_TABLET | Freq: Four times a day (QID) | ORAL | Status: DC
  Administered 2013-08-09 – 2013-08-10 (×5): 60 mg via ORAL
  Filled 2013-08-09 (×8): qty 1

## 2013-08-09 MED ORDER — POTASSIUM CHLORIDE 10 MEQ/50ML IV SOLN
10.0000 meq | INTRAVENOUS | Status: AC
  Administered 2013-08-09 (×3): 10 meq via INTRAVENOUS
  Filled 2013-08-09 (×3): qty 50

## 2013-08-09 MED ORDER — PIPERACILLIN-TAZOBACTAM 3.375 G IVPB: 3.3750 g | Freq: Four times a day (QID) | INTRAVENOUS | Status: DC

## 2013-08-09 MED ORDER — ACETYLCYSTEINE 20 % IN SOLN
4.0000 mL | Freq: Three times a day (TID) | RESPIRATORY_TRACT | Status: DC
  Administered 2013-08-09 (×2): 4 mL via RESPIRATORY_TRACT
  Filled 2013-08-09 (×6): qty 4

## 2013-08-09 NOTE — Progress Notes (Signed)
Physical Therapy Treatment Patient Details Name: [name] MRN: [id number] DOB: 12/01/1923 Today's Date: 08/09/2013    History of Present Illness Pt admit with right hip fracture with ORIF/IM nail.      PT Comments    Very slow to progress with mobility. Agree with SNF at discharge.  Follow Up Recommendations  SNF;Supervision/Assistance - 24 hour     Equipment Recommendations  None recommended by PT    Recommendations for Other Services       Precautions / Restrictions Precautions Precautions: Fall Restrictions Weight Bearing Restrictions: Yes RLE Weight Bearing: Weight bearing as tolerated    Mobility  Bed Mobility Overal bed mobility: Needs Assistance;+2 for physical assistance Bed Mobility: Supine to Sit     Supine to sit: Max assist;+2 for physical assistance     General bed mobility comments: Verbal and tactile cues for sequencing.  Assist to initiate rolling - patient reaching toward rail with RUE.  Assist to move RLE off of bed.  Patient using UE's to assist with raising trunk to sitting.  Transfers Overall transfer level: Needs assistance Equipment used: Rolling walker (2 wheeled);2 person hand held assist Transfers: Sit to/from [employer] Sit to Stand: Max assist;+2 physical assistance   Squat pivot transfers: Max assist;+2 physical assistance     General transfer comment: Verbal cues for hand placement and placement of feet.  Attempted to stand x2.  Patient attempting to use UE's/LE's to stand.  Able to clear hips from bed, but unable to reach upright position.  Transferred to chair with PT using bed pads to assist.  Ambulation/Gait                 Stairs            Wheelchair Mobility    Modified Rankin (Stroke Patients Only)       Balance Overall balance assessment: Needs assistance Sitting-balance support: Bilateral upper extremity supported;Feet supported Sitting balance-Leahy Scale: Poor Sitting balance  - Comments: Patient with very flexed posture, and leaning posteriorly and to Lt.  Mod to max assist to maintain balance. Postural control: Posterior lean;Left lateral lean Standing balance support: Bilateral upper extremity supported Standing balance-Leahy Scale: Zero Standing balance comment: Unable to reach upright position                    Cognition Arousal/Alertness: Awake/alert Behavior During Therapy: WFL for tasks assessed/performed;Anxious Overall Cognitive Status: History of cognitive impairments - at baseline                      Exercises General Exercises - Lower Extremity Ankle Circles/Pumps: AROM;Both;10 reps;Supine Heel Slides: AAROM;Both;5 reps;Supine    General Comments        Pertinent Vitals/Pain Pain in Rt hip impacting mobility    Home Living                      Prior Function            PT Goals (current goals can now be found in the care plan section) Progress towards PT goals: Not progressing toward goals - comment (Very slow to progress)    Frequency  Min 3X/week    PT Plan Current plan remains appropriate    Co-evaluation             End of Session Equipment Utilized During Treatment: Gait belt;Oxygen Activity Tolerance: Patient limited by fatigue;Patient limited by pain Patient left: in chair;with call  bell/phone within reach;with family/visitor present     Time: 1037-1100 PT Time Calculation (min): 23 min  Charges:  $Therapeutic Activity: 23-37 mins                    G Codes:      Despina Pole 09/05/2013, 1:27 PM Carita Pian. Sanjuana Kava, [place] Pager [phone]

## 2013-08-09 NOTE — Discharge Instructions (Signed)
Keep wound on right hip dry until staples out PWB, 50% right lower extremity

## 2013-08-09 NOTE — Progress Notes (Signed)
PULMONARY / CRITICAL CARE MEDICINE   Name: [name] MRN: [id number] DOB: 01-31-1924    ADMISSION DATE:  08/01/2013 CONSULTATION DATE:  08/03/2013  REFERRING MD :  Sherral Hammers PRIMARY SERVICE: PCCM  CHIEF COMPLAINT:  Shock  BRIEF PATIENT DESCRIPTION: 80 year old white male, former smoker,  with PAF and dementia fell at home 5/17. In ED he was found to have R hip fracture and presumed URI. He was admitted and underwent ORIF of R hip 5/19. Postoperatively he remained intubated was managed in ICU.  Improved, extubated and out to floor.  PCCM reconsulted 5/24 r/t complete white out L hemithorax.   SIGNIFICANT EVENTS / STUDIES:  5/17 - CT spine > No acute fracture, No acute intracranial abnormality, Cervical spondylosis without fracture 5/17 - R hip XR > Acute right intertrochanteric fracture, with separate lesser trochanteric fragment. 5/17 - CT abd/pelvis -  intertrochanteric right hip fracture, Superior pubic ramus fracture on the right, Suspicious for Ileus. Slight increase in size of infrarenal AAA 5/18 - CTA chest >Negative for PE, nodule LLL, "severe COPD" 5/19 - ORIF R hip - On vent and phenylephrine post-op 5/20 Extubated. Vasopressors weaned to off 521 Echocardiogram:  5/21 transfer to tele. 5/24 PCCM reconsulted for L hemithorax atx/collapse   LINES / TUBES: ETT 5/19 >> 5/20  RIJ CVL 5/19 >>   CULTURES: none   ANTIBIOTICS: levaquin 5/17-5/19 zosyn 5/19 >> 5/20 Unasyn 5/23> 5/25 Zosyn 5/25 >>>   SUBJECTIVE:  Congested sounding cough, very marginal cough mechanics  VITAL SIGNS: Temp:  [98.4 F (36.9 C)-100.9 F (38.3 C)] 98.4 F (36.9 C) (05/25 0510) Pulse Rate:  [89-106] 99 (05/25 0510) Resp:  [18-19] 18 (05/25 0510) BP: (130-139)/(59-86) 139/73 mmHg (05/25 0510) SpO2:  [92 %-98 %] 96 % (05/25 0510) FIO2  2lpm NP   HEMODYNAMICS:   VENTILATOR SETTINGS:   INTAKE / OUTPUT: Intake/Output     05/24 0701 - 05/25 0700 05/25 0701 - 05/26 0700   Total  Intake(mL/kg)     Urine (mL/kg/hr) 600 (0.4)    Total Output 600     Net -600            PHYSICAL EXAMINATION: General: elderly, cachectic, weak but no increased wob Neuro:  No focal deficits,  HEENT: edentulous Cardiovascular: IRIR, rate controlled Lungs:  Decreased BS on left, some air entry on left ant Abdomen:  Soft, non tender Ext: cool, no edema   LABS:  Recent Labs Lab 08/06/13 0500 08/07/13 0535 08/09/13 0500  HGB 8.2* 8.7* 9.1*  HCT 23.9* 24.2* 27.4*  WBC 6.9 9.9 13.0*  PLT 143* 181 243    Recent Labs Lab 08/04/13 0430 08/05/13 0510 08/07/13 0535 08/08/13 0500 08/09/13 0500  NA 137 140 140 140 142  K 3.8 3.4* 2.5* 2.7* 3.6*  CL 106 107 103 99 104  CO2 21 22 27 29 25   GLUCOSE 133* 97 132* 111* 102*  BUN 18 16 6 7 14   CREATININE 0.57 0.65 0.47* 0.49* 0.56  CALCIUM 7.3* 7.9* 8.0* 7.8* 7.9*  MG  --   --  1.7  --   --    No results found for this basename: AST, ALT, ALKPHOS, BILITOT, PROT, ALBUMIN, INR,  in the last 168 hours   CXR:  Dg Chest Port 1 View  08/08/2013   CLINICAL DATA:  80 year old male with left lung collapse.  EXAM: PORTABLE CHEST - 1 VIEW  COMPARISON:  08/07/2013 and prior radiographs  FINDINGS: New complete opacification of the left  hemi thorax again noted compatible with left lung collapse. Small amount of central left lung is aerated.  Right lung is clear.  There is no evidence of pneumothorax.  A right IJ central venous catheter is again noted with tip overlying the lower SVC.  IMPRESSION: Unchanged chest radiograph with continued left lung collapse   Electronically Signed   By: Hassan Rowan M.D.   On: 08/08/2013 11:59     ASSESSMENT / PLAN:   Acute Respiratory Failure - resolved.  Mucous plugging, L hemithorax collapse - no improvement thus far with conservative measures  P:   Cont supp O2 Cont mucomyst, chest PT via vest Unasyn limited benefit in inpt aspiration > changed back to zoysn per dashboard xopenex q6h   S/p ORIF R hip  fx P:   Per ortho     Christinia Gully, MD [facility] and [place] [phone] After 5:30 PM or weekends, call [phone]

## 2013-08-09 NOTE — Progress Notes (Signed)
[facility] PROGRESS NOTE  [name] DJM:[id number] DOB: 10-03-1923 DOA: 08/01/2013 PCP: [name], MD  Summary: 80 year old male, former smoker, with PAF and dementia fell at home 5/17. In ED he was found to have R hip fracture and presumed URI. He was admitted and underwent ORIF of R hip 5/19. Postoperatively he remained intubated,  transferred to ICU, PCCM managed care. Transferred back to [facility] 5/22  Assessment/Plan:    Hip fracture, right- s/p repair- Dr. Alvan Dame signed off- ok for SNF  Acute respiratory failure- has a collapsed left lung, pulm consult -x ray improved on 5/25 -wean O2 as tolerated -? Aspiration PNA-  unasyn  Hypokalemia -replete -Mg ok  Leukocytosis- monitor   HYPERTENSION   FIBRILLATION, ATRIAL: rate around 110. Will change cardizem to q6h   ANEURYSM, ABDOMINAL AORTIC   COPD   History of prostate cancer   Dementia   Metabolic encephalopathy   Hypotension resolved anemia: stable Protein calorie malnutrition Dysphagia: on D1 diet with thickened liquids    Code Status:  DNR Family Communication:  Daughter at bedside Disposition Plan:  SNF  Consults: PCCM, ortho  Procedures: ORIF right hip Right IJ CVL  HPI/Subjective: Resting, no overnight events  Objective: Filed Vitals:   08/09/13 0510  BP: 139/73  Pulse: 99  Temp: 98.4 F (36.9 C)  Resp: 18    Intake/Output Summary (Last 24 hours) at 08/09/13 0757 Last data filed at 08/09/13 2229  Gross per 24 hour  Intake      0 ml  Output    600 ml  Net   -600 ml   Filed Weights   08/01/13 1532 08/02/13 0401 08/04/13 0551  Weight: 64.411 kg (142 lb) 56.6 kg (124 lb 12.5 oz) 58.3 kg (128 lb 8.5 oz)    Exam:   General:  Resting, elderly chronically ill apprearing  HEENT: edentulous  Cardiovascular: irreg irreg  Respiratory: better airation  Abdomen: S, NT, ND   Basic Metabolic Panel:  Recent Labs Lab 08/04/13 0430 08/05/13 0510 08/07/13 0535  08/08/13 0500 08/09/13 0500  NA 137 140 140 140 142  K 3.8 3.4* 2.5* 2.7* 3.6*  CL 106 107 103 99 104  CO2 21 22 27 29 25   GLUCOSE 133* 97 132* 111* 102*  BUN 18 16 6 7 14   CREATININE 0.57 0.65 0.47* 0.49* 0.56  CALCIUM 7.3* 7.9* 8.0* 7.8* 7.9*  MG  --   --  1.7  --   --    Liver Function Tests: No results found for this basename: AST, ALT, ALKPHOS, BILITOT, PROT, ALBUMIN,  in the last 168 hours No results found for this basename: LIPASE, AMYLASE,  in the last 168 hours No results found for this basename: AMMONIA,  in the last 168 hours CBC:  Recent Labs Lab 08/04/13 0430 08/05/13 0510 08/06/13 0500 08/07/13 0535 08/09/13 0500  WBC 9.9 7.9 6.9 9.9 13.0*  HGB 9.0* 8.5* 8.2* 8.7* 9.1*  HCT 26.3* 25.6* 23.9* 24.2* 27.4*  MCV 93.3 93.8 91.6 90.3 93.2  PLT 125* 125* 143* 181 243   Cardiac Enzymes: No results found for this basename: CKTOTAL, CKMB, CKMBINDEX, TROPONINI,  in the last 168 hours BNP (last 3 results)  Recent Labs  07/31/13 1842 08/07/13 1833  PROBNP 1679.0* 8854.0*   CBG:  Recent Labs Lab 08/04/13 0006 08/04/13 0417 08/04/13 0824 08/04/13 1145 08/04/13 1751  GLUCAP 141* 125* 129* 92 114*    Recent Results (from the past 240 hour(s))  MRSA PCR  SCREENING     Status: None   Collection Time    08/02/13  2:06 AM      Result Value Ref Range Status   MRSA by PCR NEGATIVE  NEGATIVE Final   Comment:            The GeneXpert MRSA Assay (FDA     approved for NASAL specimens     only), is one component of a     comprehensive MRSA colonization     surveillance program. It is not     intended to diagnose MRSA     infection nor to guide or     monitor treatment for     MRSA infections.     Studies: Dg Chest Port 1 View  08/08/2013   CLINICAL DATA:  80 year old male with left lung collapse.  EXAM: PORTABLE CHEST - 1 VIEW  COMPARISON:  08/07/2013 and prior radiographs  FINDINGS: New complete opacification of the left hemi thorax again noted compatible  with left lung collapse. Small amount of central left lung is aerated.  Right lung is clear.  There is no evidence of pneumothorax.  A right IJ central venous catheter is again noted with tip overlying the lower SVC.  IMPRESSION: Unchanged chest radiograph with continued left lung collapse   Electronically Signed   By: Hassan Rowan M.D.   On: 08/08/2013 11:59   Dg Chest Port 1 View  08/07/2013   CLINICAL DATA:  Rhonchi.  Dysphagia.  Evaluate for aspiration.  EXAM: PORTABLE CHEST - 1 VIEW  COMPARISON:  08/04/2013 and 08/03/2013.  FINDINGS: 1632 hr. Interval extubation with removal of the nasogastric tube. A right IJ central venous catheter appears unchanged at the mid SVC level. There is new near complete opacification of the left hemithorax with volume loss and mediastinal shift to the left. The left upper lobe remains minimally aerated. The right lung has a stable appearance with calcified granulomas. No pneumothorax is evident.  IMPRESSION: New near-complete opacification of the left hemithorax with volume loss, most consistent with mucous plugging. Acute aspiration cannot be excluded.  These results were called by telephone at the time of interpretation on 08/07/2013 at 5:23 PM to the patient's nurse, Landis Martins , who verbally acknowledged these results.   Electronically Signed   By: Camie Patience M.D.   On: 08/07/2013 17:24    Scheduled Meds: . acetylcysteine  4 mL Nebulization Q6H  . ampicillin-sulbactam (UNASYN) IV  3 g Intravenous Q6H  . antiseptic oral rinse  15 mL Mouth Rinse QID  . chlorhexidine  15 mL Mouth Rinse BID  . diltiazem  60 mg Oral 4 times per day  . enoxaparin (LOVENOX) injection  40 mg Subcutaneous Q24H  . levalbuterol  0.63 mg Nebulization Q6H  . potassium chloride  10 mEq Intravenous Q1 Hr x 3  . sodium chloride  10-40 mL Intracatheter Q12H  . sodium chloride  3 mL Intravenous Q12H   Continuous Infusions:    Time spent: 25 minutes  Geradine Girt, DO [facility] Pager [phone]. If 7PM-7AM, please contact night-coverage at [email or web address], password [facility] 08/09/2013, 7:57 AM  LOS: 8 days

## 2013-08-09 NOTE — Progress Notes (Signed)
PULMONARY / CRITICAL CARE MEDICINE   Name: [name] MRN: [id number] DOB: 1923-09-18    ADMISSION DATE:  08/01/2013 CONSULTATION DATE:  08/03/2013  REFERRING MD :  Sherral Hammers PRIMARY SERVICE: PCCM  CHIEF COMPLAINT:  Shock  BRIEF PATIENT DESCRIPTION: 80 year old male, former smoker,  with PAF and dementia fell at home 5/17. In ED he was found to have R hip fracture and presumed URI. He was admitted and underwent ORIF of R hip 5/19. Postoperatively he remained intubated was managed in ICU.  Improved, extubated and out to floor.  PCCM reconsulted 5/24 r/t complete white out L hemithorax.   SIGNIFICANT EVENTS / STUDIES:  5/17 - CT spine > No acute fracture, No acute intracranial abnormality, Cervical spondylosis without fracture 5/17 - R hip XR > Acute right intertrochanteric fracture, with separate lesser trochanteric fragment. 5/17 - CT abd/pelvis -  intertrochanteric right hip fracture, Superior pubic ramus fracture on the right, Suspicious for Ileus. Slight increase in size of infrarenal AAA 5/18 - CTA chest >Negative for PE, nodule LLL, "severe COPD" 5/19 - ORIF R hip - On vent and phenylephrine post-op 5/20 Extubated. Vasopressors weaned to off 521 Echocardiogram:  5/21 transfer to tele. 5/24 PCCM reconsulted for L hemithorax atx/collapse   LINES / TUBES: ETT 5/19 >> 5/20  RIJ CVL 5/19 >>   CULTURES: none   ANTIBIOTICS: levaquin 5/17-5/19 zosyn 5/19 >> 5/20 Unasyn 5/23>>>   SUBJECTIVE:  No resp distress.  Denies CP, dyspnea Pleasantly confused, daughter at bedside afebrile  VITAL SIGNS: Temp:  [98.4 F (36.9 C)-100.9 F (38.3 C)] 98.4 F (36.9 C) (05/25 0510) Pulse Rate:  [89-106] 99 (05/25 0510) Resp:  [18-19] 18 (05/25 0510) BP: (130-139)/(59-86) 139/73 mmHg (05/25 0510) SpO2:  [92 %-98 %] 96 % (05/25 0510) HEMODYNAMICS:   VENTILATOR SETTINGS:   INTAKE / OUTPUT: Intake/Output     05/24 0701 - 05/25 0700 05/25 0701 - 05/26 0700   Total Intake(mL/kg)      Urine (mL/kg/hr) 600 (0.4)    Total Output 600     Net -600            PHYSICAL EXAMINATION: General: elderly, cachectic, NAD Neuro:  No focal deficits, pleasantly confused, oriented x 2 (name & place) HEENT: WNL Cardiovascular: IRIR, rate controlled Lungs:  Decreased BS on left, some air entry on left ant Abdomen:  Soft, non tender Ext: cool, no edema   LABS:  Recent Labs Lab 08/06/13 0500 08/07/13 0535 08/09/13 0500  HGB 8.2* 8.7* 9.1*  HCT 23.9* 24.2* 27.4*  WBC 6.9 9.9 13.0*  PLT 143* 181 243    Recent Labs Lab 08/04/13 0430 08/05/13 0510 08/07/13 0535 08/08/13 0500 08/09/13 0500  NA 137 140 140 140 142  K 3.8 3.4* 2.5* 2.7* 3.6*  CL 106 107 103 99 104  CO2 21 22 27 29 25   GLUCOSE 133* 97 132* 111* 102*  BUN 18 16 6 7 14   CREATININE 0.57 0.65 0.47* 0.49* 0.56  CALCIUM 7.3* 7.9* 8.0* 7.8* 7.9*  MG  --   --  1.7  --   --    No results found for this basename: AST, ALT, ALKPHOS, BILITOT, PROT, ALBUMIN, INR,  in the last 168 hours   CXR:  Dg Chest Port 1 View  08/08/2013   CLINICAL DATA:  80 year old male with left lung collapse.  EXAM: PORTABLE CHEST - 1 VIEW  COMPARISON:  08/07/2013 and prior radiographs  FINDINGS: New complete opacification of the left hemi thorax  again noted compatible with left lung collapse. Small amount of central left lung is aerated.  Right lung is clear.  There is no evidence of pneumothorax.  A right IJ central venous catheter is again noted with tip overlying the lower SVC.  IMPRESSION: Unchanged chest radiograph with continued left lung collapse   Electronically Signed   By: Hassan Rowan M.D.   On: 08/08/2013 11:59     ASSESSMENT / PLAN:   Acute Respiratory Failure - resolved.  Mucous plugging, L hemithorax collapse - no improvement thus far with conservative measures  P:   Cont supp O2 Cont mucomyst, chest PT via vest F/u CXR shows improved aeration on left Cont empiric abx as above  xopenex q6h   S/p ORIF R hip fx P:    Per ortho   I discussed options with pts daughter & son-in law including the possibility of worsening hypoxia during & after bronchoscopy with conscious sedation - Given improvement , best to  continue conservative measures for now. Suspect he also may be aspirating. Mobilising him would be best for his lungs !!  *Care during the described time interval was provided by me and/or other providers on the critical care team. I have reviewed this patient's available data, including medical history, events of note, physical examination and test results as part of my evaluation.   Kara Mead MD. Shade Flood. [place] [facility] Pager [phone] If no response call [phone]    08/09/2013  7:57 AM

## 2013-08-09 NOTE — Progress Notes (Signed)
Speech Language Pathology Treatment: Dysphagia  Patient Details Name: [name] MRN: [id number] DOB: 1923/04/20 Today's Date: 08/09/2013 Time: 2595-6387 SLP Time Calculation (min): 25 min  Assessment / Plan / Recommendation Clinical Impression  Treatment for dysphagia following MBS 5/22 with daughter present.  SLP reviewed MBS results and recommendations and answered daughter's questions.  Pt. required max verba/visual/tactile cues to implement strategies.  During today's session he was unable to achieve adequate chin tuck likely due to questionable vetebral limitations (arthritis?).  Max cues needed to perform multiple swallows.  Pt. consumed sip via cup when SLP's back turned (recommend tsp sips) with resultant labial leakage and questionable wet vocal quality.  Pt. Continues at high aspiration risk even with recommendations and will require continued family education.   HPI HPI: 80 y.o. male with a past medical history of atrial fibrillation, HTN, AAA, COPD, prostate cancer, history of a CVA, history of dementia, who lives with his daughter. Cough over past few weeks MD note documented fever up to 103F.   Pt. admitted after fall with resultant right hip fractureHistory of dysphagia and used to be on a special diet, but not anymore according to the daughter. Pt. has had 4 previous MBS' with Dys 3, nectar for past 3 (09/18/10). CXR 5/17 revealed interval worsening aeration at the left base compatible with developing left lower lobe atelectasis. Superimposed airspace consolidation from infection or recent aspiration is not excluded.  Chest CT revealed soft tissue effaces the left lower lobe bronchus suggesting mucus plugging/aspiration, with bronchial wall thickening/bronchitis within the left upper lobe and lingula. Pt assessed by SLP on 5/18l, determined to need objective study, but not ready. Underwent ORIF on 5/19, remained intubated until 5/20.  MBS 5/ 22 recommended Dys1, honey thick, chin tuck,  liquids by tsp.   Pertinent Vitals WDL  SLP Plan  Continue with current plan of care    Recommendations Diet recommendations: Dysphagia 1 (puree);Honey-thick liquid Liquids provided via: Teaspoon Medication Administration: Crushed with puree Supervision: Full supervision/cueing for compensatory strategies;Patient able to self feed Compensations: Slow rate;Small sips/bites;Multiple dry swallows after each bite/sip;Follow solids with liquid Postural Changes and/or Swallow Maneuvers: Seated upright 90 degrees;Upright 30-60 min after meal;Chin tuck              Oral Care Recommendations: Oral care BID Follow up Recommendations: Skilled Nursing facility Plan: Continue with current plan of care    GO     [name] M.Ed [employer] [phone]  08/09/2013

## 2013-08-09 NOTE — Progress Notes (Signed)
CSW continues to follow patient for appropriate dc needs once medically more stable.  Jeanette Caprice, MSW, [place]

## 2013-08-09 NOTE — Progress Notes (Addendum)
Patient ID: [name], male   DOB: 1923/06/27, 80 y.o.   MRN: [id number] Subjective: 6 Days Post-Op Procedure(s) (LRB): INTRAMEDULLARY (IM) NAIL INTERTROCHANTRIC (Right)    Patient stable with no reported events despite medical co-morbidities, dementia, cardiac and pulmonary  Objective:   VITALS:   Filed Vitals:   08/09/13 0510  BP: 139/73  Pulse: 99  Temp: 98.4 F (36.9 C)  Resp: 18    Incision: dressing C/D/I Moves extremity without apparent deficit   LABS  Recent Labs  08/07/13 0535 08/09/13 0500  HGB 8.7* 9.1*  HCT 24.2* 27.4*  WBC 9.9 13.0*  PLT 181 243     Recent Labs  08/07/13 0535 08/08/13 0500 08/09/13 0500  NA 140 140 142  K 2.5* 2.7* 3.6*  BUN 6 7 14   CREATININE 0.47* 0.49* 0.56  GLUCOSE 132* 111* 102*    No results found for this basename: LABPT, INR,  in the last 72 hours   Assessment/Plan: 6 Days Post-Op Procedure(s) (LRB): INTRAMEDULLARY (IM) NAIL INTERTROCHANTRIC (Right)   Discharge to SNF when stable medically Follow up in office in about 2 weeks for wound check, staple removal  Chemoprophylactic anticoagulation deferred to medicine due to history of bleeds in past

## 2013-08-10 ENCOUNTER — Inpatient Hospital Stay (HOSPITAL_COMMUNITY): Payer: Medicare Other

## 2013-08-10 MED ORDER — FUROSEMIDE 20 MG PO TABS
20.0000 mg | ORAL_TABLET | Freq: Once | ORAL | Status: AC
  Administered 2013-08-10: 20 mg via ORAL
  Filled 2013-08-10: qty 1

## 2013-08-10 MED ORDER — ACETYLCYSTEINE 20 % IN SOLN
4.0000 mL | Freq: Three times a day (TID) | RESPIRATORY_TRACT | Status: DC
  Administered 2013-08-10 – 2013-08-13 (×7): 4 mL via RESPIRATORY_TRACT
  Filled 2013-08-10 (×12): qty 4

## 2013-08-10 MED ORDER — DILTIAZEM HCL 90 MG PO TABS
90.0000 mg | ORAL_TABLET | Freq: Four times a day (QID) | ORAL | Status: DC
  Administered 2013-08-10 – 2013-08-13 (×12): 90 mg via ORAL
  Filled 2013-08-10 (×15): qty 1

## 2013-08-10 NOTE — Progress Notes (Signed)
PULMONARY / CRITICAL CARE MEDICINE   Name: [name] MRN: [id number] DOB: [birth year]    ADMISSION DATE:  08/01/2013 CONSULTATION DATE:  08/03/2013  REFERRING MD :  Sherral Hammers PRIMARY SERVICE: PCCM  CHIEF COMPLAINT:  Shock  BRIEF PATIENT DESCRIPTION: 80 year old white male, former smoker,  with PAF and dementia fell at home 5/17. In ED he was found to have R hip fracture and presumed URI. He was admitted and underwent ORIF of R hip 5/19. Postoperatively he remained intubated was managed in ICU.  Improved, extubated and out to floor.  PCCM reconsulted 5/24 r/t complete white out L hemithorax.   SIGNIFICANT EVENTS / STUDIES:  5/17 - CT spine > No acute fracture, No acute intracranial abnormality, Cervical spondylosis without fracture 5/17 - R hip XR > Acute right intertrochanteric fracture, with separate lesser trochanteric fragment. 5/17 - CT abd/pelvis -  intertrochanteric right hip fracture, Superior pubic ramus fracture on the right, Suspicious for Ileus. Slight increase in size of infrarenal AAA 5/18 - CTA chest >Negative for PE, nodule LLL, "severe COPD" 5/19 - ORIF R hip - On vent and phenylephrine post-op 5/20 Extubated. Vasopressors weaned to off 5/21 transfer to tele. 5/24 PCCM reconsulted for L hemithorax atx/collapse   LINES / TUBES: ETT 5/19 >> 5/20  RIJ CVL 5/19 >>   CULTURES: none   ANTIBIOTICS: levaquin 5/17-5/19 zosyn 5/19 >> 5/20 Unasyn 5/23> 5/25 Zosyn 5/25 >>>  SUBJECTIVE:  Congested sounding cough, very marginal cough mechanics. NAD at rest.  VITAL SIGNS: Temp:  [97.1 F (36.2 C)-98.5 F (36.9 C)] 98.4 F (36.9 C) (05/26 0513) Pulse Rate:  [57-102] 96 (05/26 0513) Resp:  [18-22] 22 (05/26 0513) BP: (115-129)/(66-73) 128/73 mmHg (05/26 0513) SpO2:  [97 %-100 %] 100 % (05/26 0750) FIO2  2lpm NP INTAKE / OUTPUT: Intake/Output     05/25 0701 - 05/26 0700 05/26 0701 - 05/27 0700   P.O. 240    Total Intake(mL/kg) 240 (4.1)    Urine (mL/kg/hr) 700 (0.5)     Total Output 700     Net -460            PHYSICAL EXAMINATION: General: elderly, cachectic, weak but no increased wob, on O2 Neuro:  No focal deficits, dementia HEENT: edentulous Cardiovascular: IRIR, rate controlled Lungs:  Decreased BS on left, some air entry on left ant, rattling cough. Abdomen:  Soft, non tender Ext: cool, no edema   LABS:  Recent Labs Lab 08/06/13 0500 08/07/13 0535 08/09/13 0500  HGB 8.2* 8.7* 9.1*  HCT 23.9* 24.2* 27.4*  WBC 6.9 9.9 13.0*  PLT 143* 181 243    Recent Labs Lab 08/04/13 0430 08/05/13 0510 08/07/13 0535 08/08/13 0500 08/09/13 0500  NA 137 140 140 140 142  K 3.8 3.4* 2.5* 2.7* 3.6*  CL 106 107 103 99 104  CO2 21 22 27 29 25   GLUCOSE 133* 97 132* 111* 102*  BUN 18 16 6 7 14   CREATININE 0.57 0.65 0.47* 0.49* 0.56  CALCIUM 7.3* 7.9* 8.0* 7.8* 7.9*  MG  --   --  1.7  --   --    CXR:  Dg Chest Port 1 View  08/09/2013   CLINICAL DATA:  Follow up left lung collapse.  EXAM: PORTABLE CHEST - 1 VIEW  COMPARISON:  08/08/2013 and 08/07/2013.  FINDINGS: 0709 hr. There has been interval partial re-expansion of the left lung. There is persistent volume loss and opacity in the left lower lobe with mediastinal shift to the  left. The right lung has a stable appearance with emphysema and scattered granulomas. Right IJ central venous catheter appears unchanged. The visualized heart size and mediastinal contours are stable.  IMPRESSION: Partial re-expansion of the left lung. There is persistent left lower lobe airspace disease and volume loss which may reflect residual collapse or pneumonia.   Electronically Signed   By: Camie Patience M.D.   On: 08/09/2013 09:26     ASSESSMENT / PLAN:  A: Acute respiratory failure 2nd to mucus plug, atelectasis, and ?HCAP. Hx of COPD. P:   Continue mucomyst for additional 24 hours >> stop after last dose on 5/27 Continue scheduled xopenex for now Continue Chest PT until respiratory secretions  improved Supplemental oxygen to keep SpO2 > 92% Zosyn per primary team  Goals of care >> DNR/DNI   [facility] Minor ACNP Maryanna Shape PCCM Pager [phone] till 3 pm If no answer page [phone] 08/10/2013, 9:07 AM   Reviewed above, and examined.  Pulmonary plan in place.  PCCM will sign off.  Please call if additional help needed.  Chesley Mires, MD [facility] Pulmonary/Critical Care 08/10/2013, 3:29 PM Pager:  [phone] After 3pm call: [phone]

## 2013-08-10 NOTE — Progress Notes (Signed)
[facility] PROGRESS NOTE  [name]:[id number] DOB: February 20, 1924 DOA: 08/01/2013 PCP: [name], MD  Summary: 80 year old male, former smoker, with PAF and dementia fell at home 5/17. In ED he was found to have R hip fracture and presumed URI. He was admitted and underwent ORIF of R hip 5/19. Postoperatively he remained intubated,  transferred to ICU, PCCM managed care. Transferred back to [facility] 5/22  Assessment/Plan:    Hip fracture, right- s/p repair- Dr. Alvan Dame signed off- ok for SNF  Acute respiratory failure- has a collapsed left lung, pulm consult -x ray improved on 5/25 -wean O2 as tolerated -? Aspiration PNA-  Zosyn Poor overall prognosis mobilizing to chair- 4 person assist  Hypokalemia -replete -Mg ok  Leukocytosis- monitor   HYPERTENSION   FIBRILLATION, ATRIAL: rate around 110. Will change cardizem to q6h   ANEURYSM, ABDOMINAL AORTIC   COPD   History of prostate cancer   Dementia   Metabolic encephalopathy   Hypotension resolved anemia: stable Protein calorie malnutrition Dysphagia: on D1 diet with thickened liquids    Code Status:  DNR Family Communication:  Daughter at bedside Disposition Plan:  SNF  Consults: PCCM, ortho  Procedures: ORIF right hip Right IJ CVL  HPI/Subjective: Up in chair, eating- no pain  Objective: Filed Vitals:   08/10/13 0513  BP: 128/73  Pulse: 96  Temp: 98.4 F (36.9 C)  Resp: 22    Intake/Output Summary (Last 24 hours) at 08/10/13 1133 Last data filed at 08/10/13 0900  Gross per 24 hour  Intake    360 ml  Output    700 ml  Net   -340 ml   Filed Weights   08/01/13 1532 08/02/13 0401 08/04/13 0551  Weight: 64.411 kg (142 lb) 56.6 kg (124 lb 12.5 oz) 58.3 kg (128 lb 8.5 oz)    Exam:   General:  Resting, elderly chronically ill apprearing  HEENT: edentulous  Cardiovascular: irreg irreg  Respiratory: better airation  Abdomen: S, NT, ND   Basic Metabolic Panel:  Recent  Labs Lab 08/04/13 0430 08/05/13 0510 08/07/13 0535 08/08/13 0500 08/09/13 0500  NA 137 140 140 140 142  K 3.8 3.4* 2.5* 2.7* 3.6*  CL 106 107 103 99 104  CO2 21 22 27 29 25   GLUCOSE 133* 97 132* 111* 102*  BUN 18 16 6 7 14   CREATININE 0.57 0.65 0.47* 0.49* 0.56  CALCIUM 7.3* 7.9* 8.0* 7.8* 7.9*  MG  --   --  1.7  --   --    Liver Function Tests: No results found for this basename: AST, ALT, ALKPHOS, BILITOT, PROT, ALBUMIN,  in the last 168 hours No results found for this basename: LIPASE, AMYLASE,  in the last 168 hours No results found for this basename: AMMONIA,  in the last 168 hours CBC:  Recent Labs Lab 08/04/13 0430 08/05/13 0510 08/06/13 0500 08/07/13 0535 08/09/13 0500  WBC 9.9 7.9 6.9 9.9 13.0*  HGB 9.0* 8.5* 8.2* 8.7* 9.1*  HCT 26.3* 25.6* 23.9* 24.2* 27.4*  MCV 93.3 93.8 91.6 90.3 93.2  PLT 125* 125* 143* 181 243   Cardiac Enzymes: No results found for this basename: CKTOTAL, CKMB, CKMBINDEX, TROPONINI,  in the last 168 hours BNP (last 3 results)  Recent Labs  07/31/13 1842 08/07/13 1833  PROBNP 1679.0* 8854.0*   CBG:  Recent Labs Lab 08/04/13 0006 08/04/13 0417 08/04/13 0824 08/04/13 1145 08/04/13 1751  GLUCAP 141* 125* 129* 92 114*    Recent  Results (from the past 240 hour(s))  MRSA PCR SCREENING     Status: None   Collection Time    08/02/13  2:06 AM      Result Value Ref Range Status   MRSA by PCR NEGATIVE  NEGATIVE Final   Comment:            The GeneXpert MRSA Assay (FDA     approved for NASAL specimens     only), is one component of a     comprehensive MRSA colonization     surveillance program. It is not     intended to diagnose MRSA     infection nor to guide or     monitor treatment for     MRSA infections.     Studies: Dg Chest Port 1 View  08/10/2013   CLINICAL DATA:  Aspiration.  EXAM: PORTABLE CHEST - 1 VIEW  COMPARISON:  Aug 09, 2013.  FINDINGS: Stable cardiomediastinal silhouette. No pneumothorax is noted. Right  internal jugular catheter line is unchanged in position. Stable left lower lobe opacity is noted concerning for pneumonia or atelectasis with associated pleural effusion. Bony thorax is intact. Stable calcified granulomata are noted in right lower lobe.  IMPRESSION: Stable left lower lobe opacity consistent with pneumonia or atelectasis with associated pleural effusion.   Electronically Signed   By: Sabino Dick M.D.   On: 08/10/2013 09:22   Dg Chest Port 1 View  08/09/2013   CLINICAL DATA:  Follow up left lung collapse.  EXAM: PORTABLE CHEST - 1 VIEW  COMPARISON:  08/08/2013 and 08/07/2013.  FINDINGS: 0709 hr. There has been interval partial re-expansion of the left lung. There is persistent volume loss and opacity in the left lower lobe with mediastinal shift to the left. The right lung has a stable appearance with emphysema and scattered granulomas. Right IJ central venous catheter appears unchanged. The visualized heart size and mediastinal contours are stable.  IMPRESSION: Partial re-expansion of the left lung. There is persistent left lower lobe airspace disease and volume loss which may reflect residual collapse or pneumonia.   Electronically Signed   By: Camie Patience M.D.   On: 08/09/2013 09:26    Scheduled Meds: . acetylcysteine  4 mL Nebulization TID  . antiseptic oral rinse  15 mL Mouth Rinse QID  . chlorhexidine  15 mL Mouth Rinse BID  . diltiazem  60 mg Oral 4 times per day  . enoxaparin (LOVENOX) injection  40 mg Subcutaneous Q24H  . levalbuterol  0.63 mg Nebulization Q6H  . piperacillin-tazobactam (ZOSYN)  IV  3.375 g Intravenous Q8H  . sodium chloride  10-40 mL Intracatheter Q12H  . sodium chloride  3 mL Intravenous Q12H   Continuous Infusions:    Time spent: 25 minutes  Geradine Girt, DO [facility] Pager [phone]. If 7PM-7AM, please contact night-coverage at [email or web address], password [facility] 08/10/2013, 11:33 AM  LOS: 9 days

## 2013-08-10 NOTE — Progress Notes (Signed)
CSW spoke to patient's daughter by bedside about SNF. Patient's daughter states that she would like [facility]. CSW confirmed that facility is able to take patient when patient is medically ready.  Jeanette Caprice, MSW, [place]

## 2013-08-10 NOTE — Progress Notes (Signed)
Pt's heart rate sustaining in afib with a rate between 125-147. Patient asymptomatic. Sitting up in chair. MD notified.   Jamesmichael Shadd,MSN,RN

## 2013-08-11 LAB — CBC
HCT: 26.2 % — ABNORMAL LOW (ref 39.0–52.0)
Hemoglobin: 8.8 g/dL — ABNORMAL LOW (ref 13.0–17.0)
MCH: 31.2 pg (ref 26.0–34.0)
MCHC: 33.6 g/dL (ref 30.0–36.0)
MCV: 92.9 fL (ref 78.0–100.0)
Platelets: 250 K/uL (ref 150–400)
RBC: 2.82 MIL/uL — ABNORMAL LOW (ref 4.22–5.81)
RDW: 15.3 % (ref 11.5–15.5)
WBC: 12.4 K/uL — ABNORMAL HIGH (ref 4.0–10.5)

## 2013-08-11 LAB — BASIC METABOLIC PANEL WITH GFR
BUN: 12 mg/dL (ref 6–23)
CO2: 31 meq/L (ref 19–32)
Calcium: 8 mg/dL — ABNORMAL LOW (ref 8.4–10.5)
Chloride: 104 meq/L (ref 96–112)
Creatinine, Ser: 0.53 mg/dL (ref 0.50–1.35)
GFR calc Af Amer: >90 mL/min
GFR calc non Af Amer: >90 mL/min
Glucose, Bld: 113 mg/dL — ABNORMAL HIGH (ref 70–99)
Potassium: 2.4 meq/L — CL (ref 3.7–5.3)
Sodium: 144 meq/L (ref 137–147)

## 2013-08-11 MED ORDER — POTASSIUM CHLORIDE 20 MEQ/15ML (10%) PO LIQD
40.0000 meq | Freq: Every day | ORAL | Status: DC
  Administered 2013-08-11: 40 meq via ORAL
  Filled 2013-08-11 (×2): qty 30

## 2013-08-11 MED ORDER — POTASSIUM CHLORIDE 20 MEQ PO PACK
40.0000 meq | PACK | Freq: Every day | ORAL | Status: DC
  Filled 2013-08-11: qty 2

## 2013-08-11 MED ORDER — POTASSIUM CHLORIDE 10 MEQ/50ML IV SOLN
10.0000 meq | INTRAVENOUS | Status: AC
  Administered 2013-08-11 (×5): 10 meq via INTRAVENOUS
  Filled 2013-08-11 (×5): qty 50

## 2013-08-11 NOTE — Progress Notes (Signed)
Speech Language Pathology Treatment: Dysphagia  Patient Details Name: [name] MRN: [id number] DOB: 1924/01/30 Today's Date: 08/11/2013 Time: 6213-0865 SLP Time Calculation (min): 10 min  Assessment / Plan / Recommendation Clinical Impression  Therapy focused on pt.'s accuracy/frequency of chin tuck during trials honey thick liquid.  Required mod-max cues to recall chin tuck and mod to implement.  Cervical limitations prevent a full chin tuck position.  Suspect delayed swallow initiation without overt s/s aspiration.  Solids not attempted this morning.  Recommend continue Dys 1, honey liquids with continued treatment to determine ability to upgrade from puree.   HPI HPI: 80 y.o. male with a past medical history of atrial fibrillation, HTN, AAA, COPD, prostate cancer, history of a CVA, history of dementia, who lives with his daughter. Cough over past few weeks MD note documented fever up to 103F.   Pt. admitted after fall with resultant right hip fractureHistory of dysphagia and used to be on a special diet, but not anymore according to the daughter. Pt. has had 4 previous MBS' with Dys 3, nectar for past 3 (09/18/10). CXR 5/17 revealed interval worsening aeration at the left base compatible with developing left lower lobe atelectasis. Superimposed airspace consolidation from infection or recent aspiration is not excluded.  Chest CT revealed soft tissue effaces the left lower lobe bronchus suggesting mucus plugging/aspiration, with bronchial wall thickening/bronchitis within the left upper lobe and lingula. Pt assessed by SLP on 5/18l, determined to need objective study, but not ready. Underwent ORIF on 5/19, remained intubated until 5/20.  MBS 5/ 22 recommended Dys1, honey thick, chin tuck, liquids by tsp.   Pertinent Vitals WDL  SLP Plan  Continue with current plan of care    Recommendations Diet recommendations: Dysphagia 1 (puree);Honey-thick liquid Liquids provided via: Teaspoon Medication  Administration: Crushed with puree Supervision: Full supervision/cueing for compensatory strategies;Patient able to self feed Compensations: Slow rate;Small sips/bites;Multiple dry swallows after each bite/sip;Follow solids with liquid Postural Changes and/or Swallow Maneuvers: Seated upright 90 degrees;Upright 30-60 min after meal;Chin tuck              Oral Care Recommendations: Oral care BID Follow up Recommendations: Skilled Nursing facility Plan: Continue with current plan of care    GO     [name] M.Ed [employer] [phone]  08/11/2013

## 2013-08-11 NOTE — Progress Notes (Addendum)
CRITICAL VALUE ALERT  Critical value received:  K 2.4  Date of notification:  08/11/13  Time of notification:  0704  Critical value read back:yes  Nurse who received alert:  [place]  MD notified (1st page):  [phone] (was told in report of critical value; MD was not notified yet; text paged sent at [place])  Time of first page:  0745  MD notified (2nd page):  Time of second page:  Responding MD:  Eliseo Squires  Time MD responded:  [phone] new orders placed for runs of K

## 2013-08-11 NOTE — Progress Notes (Signed)
Physical Therapy Treatment Patient Details Name: [name] MRN: [id number] DOB: 09/12/1923 Today's Date: 08/11/2013    History of Present Illness Pt admit with right hip fracture with ORIF/IM nail.      PT Comments    Limited session today. K+ 2.4. Bed mobility and ROM exercises only.   Follow Up Recommendations  SNF;Supervision/Assistance - 24 hour     Equipment Recommendations  None recommended by PT    Recommendations for Other Services       Precautions / Restrictions Precautions Precautions: Fall Restrictions Weight Bearing Restrictions: Yes RLE Weight Bearing: Weight bearing as tolerated    Mobility  Bed Mobility Overal bed mobility: Needs Assistance;+ 2 for safety/equipment;+2 for physical assistance Bed Mobility: Supine to Sit;Sit to Supine     Supine to sit: Tot assist;HOB elevated Sit to supine: Tot assist;HOB elevated   General bed mobility comments: Multimodal cueing for safety, technique, hand placement. Assist for trunk and bil LEs. Utilized bedpad to aid with scooting, positioning.   Transfers                 General transfer comment: Deferred-K 2.4 and no +2 help available  Ambulation/Gait                 Stairs            Wheelchair Mobility    Modified Rankin (Stroke Patients Only)       Balance Overall balance assessment: Needs assistance;History of Falls Sitting-balance support: Bilateral upper extremity supported;Feet supported Sitting balance-Leahy Scale: Poor Sitting balance - Comments: Min-Mod A to stabilize at EOB. LOB posteriorly without support. Poor trunk and bil UE strength. Sat EOB ~3-4 minutes.  Postural control: Posterior lean                          Cognition Arousal/Alertness: Awake/alert Behavior During Therapy: WFL for tasks assessed/performed Overall Cognitive Status: History of cognitive impairments - at baseline                      Exercises General Exercises -  Lower Extremity Ankle Circles/Pumps: AROM;Both;10 reps;Supine Heel Slides: AAROM;Both;10 reps;Supine Hip ABduction/ADduction: AAROM;Both;10 reps;Supine    General Comments        Pertinent Vitals/Pain Pt denied pain    Home Living                      Prior Function            PT Goals (current goals can now be found in the care plan section) Progress towards PT goals: Progressing toward goals    Frequency  Min 3X/week    PT Plan Current plan remains appropriate    Co-evaluation             End of Session Equipment Utilized During Treatment: Oxygen Activity Tolerance: Patient limited by fatigue Patient left: in bed;with call bell/phone within reach; bed alarm set     Time: 0950-1004 PT Time Calculation (min): 14 min  Charges:  $Therapeutic Activity: 8-22 mins                    G Codes:      Weston Anna, MPT Pager: [phone]

## 2013-08-11 NOTE — Progress Notes (Signed)
[facility] PROGRESS NOTE  [name] KXF:[id number] DOB: 1923/12/05 DOA: 08/01/2013 PCP: [name], MD  Summary: 80 year old male, former smoker, with PAF and dementia fell at home 5/17. In ED he was found to have R hip fracture and presumed URI. He was admitted and underwent ORIF of R hip 5/19. Postoperatively he remained intubated,  transferred to ICU, PCCM managed care. Transferred back to [facility] 5/22  Assessment/Plan:   Hip fracture, right- s/p repair- Dr. Alvan Dame signed off- ok for SNF  Acute respiratory failure- has a collapsed left lung, pulm consult -x ray improved on 5/25 -wean O2 as tolerated -? Aspiration PNA-  Zosyn Poor overall prognosis but family not understanding mobilizing to chair- 4 person assist  Hypokalemia -replete -add daily replacement -Mg ok  Leukocytosis- monitor- on IV abx   HYPERTENSION   FIBRILLATION, ATRIAL: rate around 110. Will change cardizem to q6h and increase dose   ANEURYSM, ABDOMINAL AORTIC   COPD   History of prostate cancer   Dementia   Metabolic encephalopathy   Hypotension resolved anemia: stable Protein calorie malnutrition Dysphagia: on D1 diet with thickened liquids    Code Status:  DNR Family Communication:  Daughter at bedside yesterday Disposition Plan:  SNF  Consults: PCCM, ortho  Procedures: ORIF right hip Right IJ CVL- trying to get peripheral acces in order to d/c line  HPI/Subjective: C/o thirst No CP SOB mildly improved  Objective: Filed Vitals:   08/11/13 0440  BP: 122/65  Pulse: 81  Temp: 98.3 F (36.8 C)  Resp: 18    Intake/Output Summary (Last 24 hours) at 08/11/13 1113 Last data filed at 08/11/13 0442  Gross per 24 hour  Intake    123 ml  Output    352 ml  Net   -229 ml   Filed Weights   08/01/13 1532 08/02/13 0401 08/04/13 0551  Weight: 64.411 kg (142 lb) 56.6 kg (124 lb 12.5 oz) 58.3 kg (128 lb 8.5 oz)    Exam:   General:  Resting, elderly chronically ill  apprearing  HEENT: edentulous  Cardiovascular: irreg irreg  Respiratory: better airation  Abdomen: S, NT, ND   Basic Metabolic Panel:  Recent Labs Lab 08/05/13 0510 08/07/13 0535 08/08/13 0500 08/09/13 0500 08/11/13 0545  NA 140 140 140 142 144  K 3.4* 2.5* 2.7* 3.6* 2.4*  CL 107 103 99 104 104  CO2 22 27 29 25 31   GLUCOSE 97 132* 111* 102* 113*  BUN 16 6 7 14 12   CREATININE 0.65 0.47* 0.49* 0.56 0.53  CALCIUM 7.9* 8.0* 7.8* 7.9* 8.0*  MG  --  1.7  --   --   --    Liver Function Tests: No results found for this basename: AST, ALT, ALKPHOS, BILITOT, PROT, ALBUMIN,  in the last 168 hours No results found for this basename: LIPASE, AMYLASE,  in the last 168 hours No results found for this basename: AMMONIA,  in the last 168 hours CBC:  Recent Labs Lab 08/05/13 0510 08/06/13 0500 08/07/13 0535 08/09/13 0500 08/11/13 0545  WBC 7.9 6.9 9.9 13.0* 12.4*  HGB 8.5* 8.2* 8.7* 9.1* 8.8*  HCT 25.6* 23.9* 24.2* 27.4* 26.2*  MCV 93.8 91.6 90.3 93.2 92.9  PLT 125* 143* 181 243 250   Cardiac Enzymes: No results found for this basename: CKTOTAL, CKMB, CKMBINDEX, TROPONINI,  in the last 168 hours BNP (last 3 results)  Recent Labs  07/31/13 1842 08/07/13 1833  PROBNP 1679.0* 8854.0*   CBG:  Recent Labs Lab 08/04/13 1145 08/04/13 1751  GLUCAP 92 114*    Recent Results (from the past 240 hour(s))  MRSA PCR SCREENING     Status: None   Collection Time    08/02/13  2:06 AM      Result Value Ref Range Status   MRSA by PCR NEGATIVE  NEGATIVE Final   Comment:            The GeneXpert MRSA Assay (FDA     approved for NASAL specimens     only), is one component of a     comprehensive MRSA colonization     surveillance program. It is not     intended to diagnose MRSA     infection nor to guide or     monitor treatment for     MRSA infections.     Studies: Dg Chest Port 1 View  08/10/2013   CLINICAL DATA:  Aspiration.  EXAM: PORTABLE CHEST - 1 VIEW   COMPARISON:  Aug 09, 2013.  FINDINGS: Stable cardiomediastinal silhouette. No pneumothorax is noted. Right internal jugular catheter line is unchanged in position. Stable left lower lobe opacity is noted concerning for pneumonia or atelectasis with associated pleural effusion. Bony thorax is intact. Stable calcified granulomata are noted in right lower lobe.  IMPRESSION: Stable left lower lobe opacity consistent with pneumonia or atelectasis with associated pleural effusion.   Electronically Signed   By: Sabino Dick M.D.   On: 08/10/2013 09:22    Scheduled Meds: . acetylcysteine  4 mL Nebulization TID  . antiseptic oral rinse  15 mL Mouth Rinse QID  . chlorhexidine  15 mL Mouth Rinse BID  . diltiazem  90 mg Oral 4 times per day  . enoxaparin (LOVENOX) injection  40 mg Subcutaneous Q24H  . levalbuterol  0.63 mg Nebulization Q6H  . piperacillin-tazobactam (ZOSYN)  IV  3.375 g Intravenous Q8H  . potassium chloride  10 mEq Intravenous Q1 Hr x 5  . sodium chloride  10-40 mL Intracatheter Q12H  . sodium chloride  3 mL Intravenous Q12H   Continuous Infusions:    Time spent: 25 minutes  Geradine Girt, DO [facility] Pager [phone]. If 7PM-7AM, please contact night-coverage at [email or web address], password [facility] 08/11/2013, 11:13 AM  LOS: 10 days

## 2013-08-11 NOTE — Progress Notes (Signed)
MD would like Right IJ DC'd if able to get peripheral access; peripheral access started in left forearm; passed on in report to DC right IJ in am; antibiotic currently infusing in right IJ.  Carollee Sires, RN

## 2013-08-12 ENCOUNTER — Inpatient Hospital Stay (HOSPITAL_COMMUNITY): Payer: Medicare Other

## 2013-08-12 DIAGNOSIS — E43 Unspecified severe protein-calorie malnutrition: Secondary | ICD-10-CM

## 2013-08-12 LAB — CBC
HCT: 26.1 % — ABNORMAL LOW (ref 39.0–52.0)
Hemoglobin: 8.7 g/dL — ABNORMAL LOW (ref 13.0–17.0)
MCH: 31.2 pg (ref 26.0–34.0)
MCHC: 33.3 g/dL (ref 30.0–36.0)
MCV: 93.5 fL (ref 78.0–100.0)
PLATELETS: 254 10*3/uL (ref 150–400)
RBC: 2.79 MIL/uL — AB (ref 4.22–5.81)
RDW: 15.5 % (ref 11.5–15.5)
WBC: 11 10*3/uL — AB (ref 4.0–10.5)

## 2013-08-12 LAB — BASIC METABOLIC PANEL
BUN: 9 mg/dL (ref 6–23)
CHLORIDE: 104 meq/L (ref 96–112)
CO2: 29 meq/L (ref 19–32)
CREATININE: 0.53 mg/dL (ref 0.50–1.35)
Calcium: 8 mg/dL — ABNORMAL LOW (ref 8.4–10.5)
GFR calc non Af Amer: >90 mL/min (ref >90)
Glucose, Bld: 104 mg/dL — ABNORMAL HIGH (ref 70–99)
POTASSIUM: 3.4 meq/L — AB (ref 3.7–5.3)
Sodium: 141 mEq/L (ref 137–147)

## 2013-08-12 MED ORDER — ENSURE PUDDING PO PUDG
1.0000 | Freq: Three times a day (TID) | ORAL | Status: DC
  Administered 2013-08-12 – 2013-08-13 (×3): 1 via ORAL

## 2013-08-12 MED ORDER — POTASSIUM CHLORIDE 10 MEQ/100ML IV SOLN
10.0000 meq | INTRAVENOUS | Status: AC
  Administered 2013-08-12 (×3): 10 meq via INTRAVENOUS
  Filled 2013-08-12 (×3): qty 100

## 2013-08-12 NOTE — Significant Event (Signed)
Found patient with nasal cannula tube on his forehead. O2 saturation checked and it was 95% on room. Patient remained on room air. No complaints at this time. Will continue to monitor. Xue Low, Therapist, sports

## 2013-08-12 NOTE — Significant Event (Signed)
Right IJ removed per Dr. Jenne Campus verbal order during progression meeting this morning. Delayed in line removal due to patient receiving IV potassium and antibiotics via central line. CVC removed using sterile techniques, following protocol. Patient is bedrest. Will continue to monitor. Drayden Lukas, Therapist, sports.

## 2013-08-12 NOTE — Progress Notes (Signed)
NUTRITION FOLLOW UP  DOCUMENTATION CODES Per approved criteria  -Severe malnutrition in the context of chronic illness -Underweight   INTERVENTION: Re-order Ensure Pudding po TID, each supplement provides 170 kcal and 4 grams of protein RD to follow for nutrition care plan  NUTRITION DIAGNOSIS: Increased nutrient needs related to hip fracture as evidenced by estimated nutrition needs, ongoing  Goal: Pt to meet >/= 90% of their estimated nutrition needs, progressing  Monitor:  PO & supplemental intake, goals of care, weight, labs, I/O's  ASSESSMENT: 80 y.o. male with a PMH of atrial fibrillation, prostate cancer, CVA and dementia.  Patient s/p fall at home. EMS was called and the patient was brought into the hospital.  X-ray showed IT hip fracture.  Patient s/p procedure 5/19: INTRAMEDULLARY (IM) NAIL INTERTROCHANTRIC (Right), ORIF  Patient continues on a Dys 1, honey-thick liquids diet.  PO intake variable at 20-50% per flowsheet records.  Ensure Pudding ordered per RD 5/22, however, discontinued -- will re-order.  Overall prognosis is poor.    Disposition: SNF placement.  Height: Ht Readings from Last 1 Encounters:  08/04/13 6\' 2"  (1.88 m)    Weight: Wt Readings from Last 1 Encounters:  08/04/13 128 lb 8.5 oz (58.3 kg)   08/02/12 132 lbs (RD re-weighed)  BMI:  16.9 kg/m2 (using 132 lbs)  Estimated Nutritional Needs: Kcal: 1600-1800 Protein: 80-90 gm Fluid: 1.6-1.8 L  Skin: Intact  Diet Order: Dysphagia 1, honey-thick liquids    Intake/Output Summary (Last 24 hours) at 08/12/13 1317 Last data filed at 08/12/13 1214  Gross per 24 hour  Intake    890 ml  Output   1201 ml  Net   -311 ml    Labs:   Recent Labs Lab 08/07/13 0535  08/09/13 0500 08/11/13 0545 08/12/13 0620  NA 140  < > 142 144 141  K 2.5*  < > 3.6* 2.4* 3.4*  CL 103  < > 104 104 104  CO2 27  < > 25 31 29   BUN 6  < > 14 12 9   CREATININE 0.47*  < > 0.56 0.53 0.53  CALCIUM 8.0*  < >  7.9* 8.0* 8.0*  MG 1.7  --   --   --   --   GLUCOSE 132*  < > 102* 113* 104*  < > = values in this interval not displayed.  Scheduled Meds: . acetylcysteine  4 mL Nebulization TID  . antiseptic oral rinse  15 mL Mouth Rinse QID  . chlorhexidine  15 mL Mouth Rinse BID  . diltiazem  90 mg Oral 4 times per day  . enoxaparin (LOVENOX) injection  40 mg Subcutaneous Q24H  . levalbuterol  0.63 mg Nebulization Q6H  . piperacillin-tazobactam (ZOSYN)  IV  3.375 g Intravenous Q8H  . sodium chloride  10-40 mL Intracatheter Q12H  . sodium chloride  3 mL Intravenous Q12H    Continuous Infusions:    Past Medical History  Diagnosis Date  . Atrial fibrillation   . AAA (abdominal aortic aneurysm)   . Carotid stenosis   . Hypercholesteremia   . Cardiomyopathy     improved  . Renal artery stenosis   . COPD (chronic obstructive pulmonary disease)   . Osteoarthritis   . Atrial flutter     s/p ablation  . Hip fracture   . DEMENTIA   . Hypertension   . Macular degeneration disease   . Prostate cancer     Past Surgical History  Procedure Laterality Date  .  Appendectomy    . Cholecystectomy    . Tonsillectomy    . Intramedullary (im) nail intertrochanteric Right 08/03/2013    Procedure: INTRAMEDULLARY (IM) NAIL INTERTROCHANTRIC;  Surgeon: Mauri Pole, MD;  Location: [place];  Service: Orthopedics;  Laterality: Right;    Arthur Holms, RD, LDN Pager #: [phone] After-Hours Pager #: [phone]

## 2013-08-12 NOTE — Progress Notes (Signed)
[facility] PROGRESS NOTE  [name] ZOX:[id number] DOB: 23-Jun-1923 DOA: 08/01/2013 PCP: [name], MD  Summary: 80 year old male, former smoker, with PAF and dementia fell at home 5/17. In ED he was found to have R hip fracture and presumed URI. He was admitted and underwent ORIF of R hip 5/19. Postoperatively he remained intubated,  transferred to ICU, PCCM managed care. Transferred back to [facility] 5/22  Assessment/Plan:   Hip fracture, right- s/p repair- Dr. Alvan Dame signed off- ok for SNF  Acute respiratory failure- has a collapsed left lung, pulm consult Will repeat CXR  Hypokalemia replete  Leukocytosis- monitor- on IV abx   HYPERTENSION   FIBRILLATION, ATRIAL: rate controlled   ANEURYSM, ABDOMINAL AORTIC   COPD   History of prostate cancer   Dementia   Metabolic encephalopathy   Hypotension resolved anemia: stable Protein calorie malnutrition Dysphagia: on D1 diet with thickened liquids    Code Status:  DNR Family Communication:  Daughter  Disposition Plan:  SNF  Consults: PCCM, ortho  Procedures: ORIF right hip Right IJ CVL- trying to get peripheral acces in order to d/c line  HPI/Subjective: C/o thirst No CP SOB mildly improved  Objective: Filed Vitals:   08/12/13 0542  BP: 132/63  Pulse: 75  Temp: 97.7 F (36.5 C)  Resp: 16    Intake/Output Summary (Last 24 hours) at 08/12/13 1240 Last data filed at 08/12/13 1214  Gross per 24 hour  Intake   1010 ml  Output   1201 ml  Net   -191 ml   Filed Weights   08/01/13 1532 08/02/13 0401 08/04/13 0551  Weight: 64.411 kg (142 lb) 56.6 kg (124 lb 12.5 oz) 58.3 kg (128 lb 8.5 oz)    Exam:   General:  Resting, elderly chronically ill apprearing  HEENT: edentulous  Cardiovascular: irreg irreg  Respiratory: better airation  Abdomen: S, NT, ND   Basic Metabolic Panel:  Recent Labs Lab 08/07/13 0535 08/08/13 0500 08/09/13 0500 08/11/13 0545 08/12/13 0620  NA 140  140 142 144 141  K 2.5* 2.7* 3.6* 2.4* 3.4*  CL 103 99 104 104 104  CO2 27 29 25 31 29   GLUCOSE 132* 111* 102* 113* 104*  BUN 6 7 14 12 9   CREATININE 0.47* 0.49* 0.56 0.53 0.53  CALCIUM 8.0* 7.8* 7.9* 8.0* 8.0*  MG 1.7  --   --   --   --    Liver Function Tests: No results found for this basename: AST, ALT, ALKPHOS, BILITOT, PROT, ALBUMIN,  in the last 168 hours No results found for this basename: LIPASE, AMYLASE,  in the last 168 hours No results found for this basename: AMMONIA,  in the last 168 hours CBC:  Recent Labs Lab 08/06/13 0500 08/07/13 0535 08/09/13 0500 08/11/13 0545 08/12/13 0620  WBC 6.9 9.9 13.0* 12.4* 11.0*  HGB 8.2* 8.7* 9.1* 8.8* 8.7*  HCT 23.9* 24.2* 27.4* 26.2* 26.1*  MCV 91.6 90.3 93.2 92.9 93.5  PLT 143* 181 243 250 254   Cardiac Enzymes: No results found for this basename: CKTOTAL, CKMB, CKMBINDEX, TROPONINI,  in the last 168 hours BNP (last 3 results)  Recent Labs  07/31/13 1842 08/07/13 1833  PROBNP 1679.0* 8854.0*   CBG: No results found for this basename: GLUCAP,  in the last 168 hours  No results found for this or any previous visit (from the past 240 hour(s)).   Studies: Dg Chest Port 1 View  08/12/2013   CLINICAL DATA:  Aspiration pneumonia with volume loss  EXAM: PORTABLE CHEST - 1 VIEW  COMPARISON:  Aug 10, 2013  FINDINGS: There is increase consolidation in the left mid and lower lung zones with significant increase in volume loss on the left. The right lung is hyperexpanded. There are scattered granulomas on the right; there is no edema or consolidation on the right. The heart size is within normal limits. Central catheter tip is in the superior vena cava near the cavoatrial junction, stable. No pneumothorax. No bone lesions.  IMPRESSION: Increase in consolidation on the left with progressive volume loss on the left. There may be a degree of mucous plugging in the left lower lobe given this change. Right lung is hyperexpanded without  edema or consolidation.  Note that these changes on the left are entirely consistent with aspiration.  These results will be called to the ordering clinician or representative by the Radiologist Assistant, and communication documented in the PACS or zVision Dashboard.   Electronically Signed   By: Lowella Grip M.D.   On: 08/12/2013 10:02    Scheduled Meds: . acetylcysteine  4 mL Nebulization TID  . antiseptic oral rinse  15 mL Mouth Rinse QID  . chlorhexidine  15 mL Mouth Rinse BID  . diltiazem  90 mg Oral 4 times per day  . enoxaparin (LOVENOX) injection  40 mg Subcutaneous Q24H  . levalbuterol  0.63 mg Nebulization Q6H  . piperacillin-tazobactam (ZOSYN)  IV  3.375 g Intravenous Q8H  . potassium chloride  10 mEq Intravenous Q1 Hr x 3  . sodium chloride  10-40 mL Intracatheter Q12H  . sodium chloride  3 mL Intravenous Q12H   Continuous Infusions:    Time spent: 35 minutes  Delfina Redwood, MD [facility] Pager [phone]. If 7PM-7AM, please contact night-coverage at [email or web address], password [facility] 08/12/2013, 12:40 PM  LOS: 11 days   Addendum: CXR shows worsening LLL infiltrate. Discussed with daughter. Prognosis poor. She voices understanding. Will go to SNF to attempt rehab, but if clinical status worsens, would not want hospitalization and would transition to comfort care.    Doree Barthel, MD

## 2013-08-13 LAB — BASIC METABOLIC PANEL
BUN: 9 mg/dL (ref 6–23)
CALCIUM: 8.7 mg/dL (ref 8.4–10.5)
CHLORIDE: 102 meq/L (ref 96–112)
CO2: 29 mEq/L (ref 19–32)
Creatinine, Ser: 0.59 mg/dL (ref 0.50–1.35)
GFR calc Af Amer: >90 mL/min (ref >90)
GFR calc non Af Amer: 88 mL/min — ABNORMAL LOW (ref >90)
GLUCOSE: 114 mg/dL — AB (ref 70–99)
POTASSIUM: 5 meq/L (ref 3.7–5.3)
SODIUM: 139 meq/L (ref 137–147)

## 2013-08-13 MED ORDER — DILTIAZEM HCL 30 MG PO TABS: 30.0000 mg | ORAL_TABLET | Freq: Four times a day (QID) | ORAL | Status: DC

## 2013-08-13 MED ORDER — LEVALBUTEROL HCL 0.63 MG/3ML IN NEBU: 0.6300 mg | INHALATION_SOLUTION | Freq: Four times a day (QID) | RESPIRATORY_TRACT | Status: DC

## 2013-08-13 MED ORDER — ENSURE PUDDING PO PUDG: 1.0000 | Freq: Three times a day (TID) | ORAL | Status: AC

## 2013-08-13 MED ORDER — LEVALBUTEROL HCL 0.63 MG/3ML IN NEBU
0.6300 mg | INHALATION_SOLUTION | Freq: Four times a day (QID) | RESPIRATORY_TRACT | Status: DC
Start: 2013-08-13 — End: 2013-08-13

## 2013-08-13 MED ORDER — ENOXAPARIN SODIUM 40 MG/0.4ML ~~LOC~~ SOLN: 40.0000 mg | SUBCUTANEOUS | Status: DC

## 2013-08-13 MED ORDER — AMOXICILLIN-POT CLAVULANATE 875-125 MG PO TABS: 1.0000 | ORAL_TABLET | Freq: Two times a day (BID) | ORAL | Status: DC

## 2013-08-13 MED ORDER — BIOTENE DRY MOUTH MT LIQD: 15.0000 mL | Freq: Four times a day (QID) | OROMUCOSAL | Status: DC

## 2013-08-13 NOTE — Care Management Note (Signed)
    Page 1 of 1   08/13/2013     1:42:28 PM CARE MANAGEMENT NOTE 08/13/2013  Patient:  [name], [name]   Account Number:  [id number]  Date Initiated:  08/02/2013  Documentation initiated by:  MAYO,HENRIETTA  Subjective/Objective Assessment:   dx hip fx, ?PNA; lives with dtr and son-in-law    PCP Royetta Car     Action/Plan:   Anticipated DC Date:  08/09/2013   Anticipated DC Plan:  [place] referral  Clinical Social Worker      DC Planning Services  CM consult      Choice offered to / List presented to:             Status of service:  In process, will continue to follow Medicare Important Message given?  YES (If response is "NO", the following Medicare IM given date fields will be blank) Date Medicare IM given:   Date Additional Medicare IM given:  08/10/2013  Discharge Disposition:  [place]  Per UR Regulation:  Reviewed for med. necessity/level of care/duration of stay  If discussed at [place] of Stay Meetings, dates discussed:   08/12/2013    Comments:  Contact: Anda Kraft, dtr [phone])  08/13/13 Ellan Lambert, RN, BSN [phone] Pt discharging to SNF today, per CSW arrangements.  08-04-13 7:15am [place] Post op hip repair for fracture from fall.  Remains intubated.  tx to ICU.  Will need rehab - level questionable.  08/03/13 Time MSN BSN CCM Dtr discussed with CSW that she may need long-term placement for pt-communicated her preferences.  Scheduled for surgery today.

## 2013-08-13 NOTE — Clinical Social Work Note (Signed)
Clinical Social Worker facilitated patient discharge including contacting patient family and facility to confirm patient discharge plans.  Clinical information faxed to facility and family agreeable with plan.  CSW arranged ambulance transport via [facility] to [facility].  RN to call report prior to discharge.  Clinical Social Worker will sign off for now as social work intervention is no longer needed. Please consult us again if new need arises.  Jesse Lashara Urey, LCSW [phone] 

## 2013-08-13 NOTE — Discharge Summary (Signed)
Physician Discharge Summary  BB ROZEBOOM [id number] DOB: [birth year] DOA: 08/01/2013  PCP: [name], MD  Admit date: 08/01/2013 Discharge date: 08/13/2013  Time spent: greater than 30 min  Recommendations for Outpatient Follow-up:  1. Family would like to avoid rehospitalization. They wish to attempt rehab, but if patient declines, would likely transition to comfort care  Discharge Diagnoses:  Principal Problem:   Hip fracture, right, s/p ORIF by Dr. Alvan Dame 08/03/13 Active Problems:   HYPERTENSION   FIBRILLATION, ATRIAL, not a chronic anticoagulation candidate   ANEURYSM, ABDOMINAL AORTIC   COPD   Dysphagia with continued aspiration risk   History of prostate cancer   Dementia   Acute respiratory failure with hypoxia   Metabolic encephalopathy   Hypotension   Fracture of right superior pubic ramus   Severe protein-calorie malnutrition   Aspiration into airway with aspiration pneumonia  Discharge Condition: stable  Code Status: DNR  Filed Weights   08/01/13 1532 08/02/13 0401 08/04/13 0551  Weight: 64.411 kg (142 lb) 56.6 kg (124 lb 12.5 oz) 58.3 kg (128 lb 8.5 oz)    History of present illness:  80 y.o. male with a past medical history of atrial fibrillation, not on anticoagulation due to bleeding issues in the past, history of prostate cancer, history of a CVA, history of dementia, who lives with his daughter. Patient is accompanied by his daughter today. Patient has dementia and is unable to provide any history. According to the daughter he was sitting at the side of the bed and was feeling thirsty. She turned around to go to the kitchen and had walked a few steps and she heard a crashing sound. She immediately come back to his room and he was lying on the floor. He was awake. Apparently, there was no syncopal episode. He complained of pain in the right hip area. EMS was called and the patient was brought into the hospital. Over the past couple of weeks patient  has been having cough and yesterday he had a fever up to 103F. He was brought into the emergency department. Chest x-ray was inconclusive, but based on clinical presentation he was thought to have some kind of respiratory tract infection and was prescribed Levaquin. She hasn't yet filled that prescription. Patient has had dysphagia in the past and used to be on a special diet, but not anymore according to the daughter. Denies any sick contacts. No further history is available from the patient due to his dementia. In the emergency room, found to have a hip fracture on the right and right superior ramus fracture.  Hospital Course:  Admitted to hospitalists.  Orthopedics consulted. Patient was felt to be at increased risk for cardiac or pulmonary complications no contraindications for surgery. Patient underwent repair. Has been on Lovenox for DVT prophylaxis. Postoperatively, was kept on a ventilator and admitted to ICU under the critical care service. He had transient hypotension felt to be related to diprivan.  He was stabilized and able to be extubated. He had some postoperative confusion but has a baseline dementia. Transferred out of the unit back to the hospitalist service on 522. Speech therapy consulted and recommended strict aspiration precautions, dysphagia 1 diet with honey thickened liquids. On Saturday 5/23, noted to have increasing chest congestion. Chest x-ray showed aspiration versus mucous plugging with collapse on the left. Pulmonary medicine was re counseled that and after discussion with family, attempt to avoid bronchoscopy due to increased risk. He was started on bronchodilators and Mucomyst as well as  chest percussion. He also was started on Unasyn for aspiration pneumonia. After several days of the above treatments, his left lung reexpanded. He is still at increased risk for aspiration pneumonia. I have had a long discussion with family who are realistic about his prognosis. They wish to  attempt rehabilitation, but if patient should continued to decline, they would be open to hospice/comfort measures. Currently, he is tolerating his modified diet. Afebrile and normal oxygen saturations. He is stable for transfer to skilled nursing facility. CODE STATUS on admission is DO NOT RESUSCITATE and this will be continued.  Also during hospitalization, medications were adjusted for atrial fibrillation with slightly rapid rate. Currently his heart rate is in 80s, remains an atrophic relation. Not a long-term Coumadin candidate, but may continue Lovenox at DVT prophylaxis dosing for 3 more weeks. He will need followup with Dr. Charlann Boxer after discharge.  Procedures: ORIF right hip 08/03/13 by Dr. Charlann Boxer Right IJ central line  Consultations:  Orthopedics  Pulmonary critical care  Discharge Exam: Filed Vitals:   08/13/13 0321  BP: 127/67  Pulse: 77  Temp: 97.9 F (36.6 C)  Resp: 20    General: Asleep. Arousable. Confused. Breathing nonlabored. Cardiovascular: Irregularly irregular without murmurs gallops rubs Respiratory: Clear to auscultation bilaterally without wheezes rhonchi or rales Abdomen soft nontender nondistended Extremities no clubbing cyanosis or edema  Discharge Instructions   Discharge instructions    Complete by:  As directed   Pureed diet with nectar thickened liquids. Small bites with chin tuck. Crush meds in puree when able. Full supervision with meals. Seated fully upright.     Walk with assistance    Complete by:  As directed      Weight bearing as tolerated    Complete by:  As directed             Medication List    STOP taking these medications       diltiazem 180 MG 24 hr capsule  Commonly known as:  CARDIZEM CD     levofloxacin 750 MG tablet  Commonly known as:  LEVAQUIN     potassium chloride SA 20 MEQ tablet  Commonly known as:  K-DUR,KLOR-CON      TAKE these medications       amoxicillin-clavulanate 875-125 MG per tablet  Commonly  known as:  AUGMENTIN  Take 1 tablet by mouth 2 (two) times daily. Through 08/16/13     antiseptic oral rinse Liqd  15 mLs by Mouth Rinse route QID.     aspirin EC 81 MG tablet  Take 81 mg by mouth every evening.     diltiazem 30 MG tablet  Commonly known as:  CARDIZEM  Take 1 tablet (30 mg total) by mouth every 6 (six) hours.     enoxaparin 40 MG/0.4ML injection  Commonly known as:  LOVENOX  Inject 0.4 mLs (40 mg total) into the skin daily. For 21 days     feeding supplement (ENSURE) Pudg  Take 1 Container by mouth 3 (three) times daily between meals.     levalbuterol 0.63 MG/3ML nebulizer solution  Commonly known as:  XOPENEX  Take 3 mLs (0.63 mg total) by nebulization every 6 (six) hours. With chest percussion if able     metoprolol 50 MG tablet  Commonly known as:  LOPRESSOR  Take 50 mg by mouth 2 (two) times daily.     pravastatin 40 MG tablet  Commonly known as:  PRAVACHOL  Take 40 mg by mouth at bedtime.  traMADol-acetaminophen 37.5-325 MG per tablet  Commonly known as:  ULTRACET  Take 1 tablet by mouth every 6 (six) hours as needed.       No Known Allergies     Follow-up Information   Follow up with Mauri Pole, MD. Schedule an appointment as soon as possible for a visit in 2 weeks.   Specialty:  Orthopedic Surgery   Contact information:   [address] [place] [address] [phone]       Follow up with SNF provider. (within one week)        The results of significant diagnostics from this hospitalization (including imaging, microbiology, ancillary and laboratory) are listed below for reference.    Significant Diagnostic Studies: Dg Chest 1 View  08/01/2013   CLINICAL DATA:  Fall.  Right hip pain.  EXAM: CHEST - 1 VIEW  COMPARISON:  DG CHEST 2 VIEW dated 07/31/2013  FINDINGS: Wedge resection clips in the left lung.  Thoracic spondylosis. Atherosclerotic calcification of the aortic arch. Old granulomatous disease bilaterally  with calcified pulmonary granulomas.  Mild cardiomegaly. Indistinct vasculature with cephalization of blood flow favoring pulmonary venous hypertension.  Degenerative right glenohumeral arthropathy.  IMPRESSION: 1. Cardiomegaly with cephalization of blood flow suggesting pulmonary venous hypertension. 2. Old granulomatous disease. 3. Bony demineralization. 4. Atherosclerosis.   Electronically Signed   By: Sherryl Barters M.D.   On: 08/01/2013 17:11   Dg Chest 2 View  07/31/2013   CLINICAL DATA:  Cough and chest pain  EXAM: CHEST  2 VIEW  COMPARISON:  01/21/2011  FINDINGS: Evidence of granulomatous disease reidentified. Heart size upper limits of normal. Clips project over the right upper quadrant. No pleural effusion. No acute osseous abnormality. Flowing osteophytosis is noted throughout the thoracic spine. Left upper lobe chain sutures noted.  IMPRESSION: No acute cardiopulmonary process. Evidence of granulomas disease reidentified.   Electronically Signed   By: Conchita Paris M.D.   On: 07/31/2013 19:22   Dg Hip Complete Right  08/01/2013   CLINICAL DATA:  Fall.  Right hip pain.  EXAM: RIGHT HIP - COMPLETE 2+ VIEW  COMPARISON:  CT HIP*R* W/O CM dated 04/27/2010  FINDINGS: Left dynamic hip screw.  Prostate seed implants noted.  Prominent bony demineralization. Acute right hip intertrochanteric fracture is present. There is a separate lesser trochanteric fragment.  Old deformity from prior right pubic ramus fractures noted.  Mildly dilated loops of bowel in the pelvis including a 4.2 cm right pelvic small bowel loop, possibly posttraumatic ileus.  IMPRESSION: 1. Acute right intertrochanteric fracture, with separate lesser trochanteric fragment. 2. Old deformity in the right pubic bones from old fractures. 3. Prominent bony demineralization. 4. Mildly dilated small bowel in the pelvis, query posttraumatic ileus.   Electronically Signed   By: Sherryl Barters M.D.   On: 08/01/2013 17:09   Dg Hip Operative  Right  08/04/2013   CLINICAL DATA:  Status post ORIF for right intertrochanteric fracture.  EXAM: DG OPERATIVE RIGHT HIP  TECHNIQUE: Two spot fluoroscopic AP images of the right hip submitted.  COMPARISON:  Preoperative right hip series  FINDINGS: A telescoping screw and intra medullary rod placed for the intratrochanteric fracture. Fracture fragment alignment near anatomic.  IMPRESSION: Status post ORIF for intertrochanteric fracture of the right hip with no evidence of immediate postprocedure complication.   Electronically Signed   By: David  Martinique   On: 08/04/2013 08:34   Ct Head Wo Contrast  08/04/2013   CLINICAL DATA:  Found on floor.  EXAM: CT HEAD WITHOUT CONTRAST  CT CERVICAL SPINE WITHOUT CONTRAST  TECHNIQUE: Multidetector CT imaging of the head and cervical spine was performed following the standard protocol without intravenous contrast. Multiplanar CT image reconstructions of the cervical spine were also generated.  COMPARISON:  CT head 01/21/2011  FINDINGS: CT HEAD FINDINGS  Moderate to advanced atrophy. Atherosclerotic disease. Mild chronic microvascular ischemic change in the white matter.  Negative for acute infarct, hemorrhage, or mass.  Negative for skull fracture. Mucosal thickening left maxillary sinus.  CT CERVICAL SPINE FINDINGS  Negative for fracture.  Cervical degenerative changes are most severe at C5-6 and C6-7 with disc and facet degeneration and moderate spinal stenosis. There is foraminal encroachment bilaterally due to spurring at C5-6 and C6-7.  IMPRESSION: No acute intracranial abnormality. Mild chronic microvascular ischemia.  Cervical spondylosis without fracture.   Electronically Signed   By: Franchot Gallo M.D.   On: 08/04/2013 10:17   Ct Angio Chest Pe W/cm &/or Wo Cm  08/02/2013   CLINICAL DATA:  Hypoxia, pneumonia.  Fall from bed.  EXAM: CT ANGIOGRAPHY CHEST WITH CONTRAST  TECHNIQUE: Multidetector CT imaging of the chest was performed using the standard protocol during  bolus administration of intravenous contrast. Multiplanar CT image reconstructions and MIPs were obtained to evaluate the vascular anatomy.  CONTRAST:  29mL OMNIPAQUE IOHEXOL 350 MG/ML SOLN  COMPARISON:  DG CHEST 1V PORT dated 08/01/2013  FINDINGS: Adequate pulmonary arterial contrast opacification. Main pulmonary artery is mildly enlarged, 3.4 cm in transaxial dimension. No pulmonary arterial filling defects to the subsegmental branches.  Heart is moderately enlarged, with prominent right atrium. Great vessel Coronary artery calcifications. Pericardium is nonsuspicious. Moderate calcific atherosclerosis of the aortic arch and descending aorta which is overall normal in course and caliber. Left hilar abnormal soft tissue likely reflects lymphadenopathy, right calcified hilar lymph nodes.  Severe centrilobular emphysema. Azygos fissure. Multiple subpleural nodules may be benign, measuring up to 6 mm. Scattered calcified granulomas. Air cyst in left lung base and mild interstitial prominence. 6 mm sub solid left lower lobe pulmonary nodule, lateral segment. Bronchial wall thickening, with soft tissue effacing the left lower lobe pulmonary artery, and debris within the bronchi. Near complete effacement of the lingular and to lesser extent left upper lobe bronchi. No pneumothorax.  Included view of the abdomen demonstrates splenic granulomas. Patient is cachectic. Remote left posterior rib fractures. Chronic appearing mild wedging of vertebral body T3. Osteopenia.  Review of the MIP images confirms the above findings.  IMPRESSION: No acute pulmonary embolism. Enlarged main pulmonary artery suggests pulmonary arterial hypertension in a background of severe COPD.  Soft tissue effaces the left lower lobe bronchus suggesting mucus plugging/aspiration, with bronchial wall thickening/bronchitis within the left upper lobe and lingula. Left lung base atelectasis. Fullness of these soft tissues about the left hilum may reflect  lymphadenopathy.  Cardiomegaly without overt findings of failure.  6 mm sub solid pulmonary nodule in left lower lobe in a background of chronic granulomatous changes; recommend follow-up CT in 3 months to confirm persistence.   Electronically Signed   By: Elon Alas   On: 08/02/2013 00:37   Ct Cervical Spine Wo Contrast  08/01/2013   CLINICAL DATA:  Found on floor.  EXAM: CT HEAD WITHOUT CONTRAST  CT CERVICAL SPINE WITHOUT CONTRAST  TECHNIQUE: Multidetector CT imaging of the head and cervical spine was performed following the standard protocol without intravenous contrast. Multiplanar CT image reconstructions of the cervical spine were also generated.  COMPARISON:  CT  head 01/21/2011  FINDINGS: CT HEAD FINDINGS  Moderate to advanced atrophy. Atherosclerotic disease. Mild chronic microvascular ischemic change in the white matter.  Negative for acute infarct, hemorrhage, or mass.  Negative for skull fracture. Mucosal thickening left maxillary sinus.  CT CERVICAL SPINE FINDINGS  Negative for fracture.  Cervical degenerative changes are most severe at C5-6 and C6-7 with disc and facet degeneration and moderate spinal stenosis. There is foraminal encroachment bilaterally due to spurring at C5-6 and C6-7.  IMPRESSION: No acute intracranial abnormality. Mild chronic microvascular ischemia.  Cervical spondylosis without fracture.   Electronically Signed   By: Marlan Palau M.D.   On: 08/01/2013 16:53   Ct Abdomen Pelvis W Contrast  08/01/2013   CLINICAL DATA:  Abdominal pain.  EXAM: CT ABDOMEN AND PELVIS WITH CONTRAST  TECHNIQUE: Multidetector CT imaging of the abdomen and pelvis was performed using the standard protocol following bolus administration of intravenous contrast.  CONTRAST:  OMNIPAQUE IOHEXOL 300 MG/ML  SOLN  COMPARISON:  None.  FINDINGS: Areas of atelectasis versus scarring within the lung bases. Evaluation is degraded by respiratory motion artifact.  Patient is status post  cholecystectomy. The liver is otherwise unremarkable. The hepatic flexure is interposed between the liver and the anterior abdominal wall.  The adrenals and pancreas are unremarkable. Diffuse punctate calcifications within the spleen consistent prior granulomatous disease Bilateral nonenhancing benign-appearing cyst within the kidneys. No evidence hydronephrosis or solid masses.  An infrarenal abdominal aortic aneurysm is appreciated slightly larger when compared to previous ultrasound report dated 05/10/2010. Present measurements are 4.5 x 4.2 cm previous maximal diameter 4 cm. Mural calcifications are appreciated within the abdominal aorta and mesenteric vessels.  No abdominal or pelvic masses, free fluid, nor loculated fluid collections. Multiple air-filled loops of large and air-filled distended loops of small bowel. A transition point is appreciated within the distal sigmoid colon in the posterior aspect of the pelvis on the left. Fluid filled rectosigmoid colon is appreciated.  No abdominal or pelvic masses, free fluid, nor loculated fluid collections.  A comminuted intertrochanteric right hip fracture. A superior right pubis ramus fracture is appreciated age indeterminate. Multilevel spondylosis within the spine. The left hip status postop reduction internal fixation.  IMPRESSION: 1. Comminuted intertrochanteric right hip fracture 2. Superior pubic ramus fracture on the right.  Age-indeterminate 3. Findings consistent with an ileus. 4. Slight increased size of the patient's known infrarenal abdominal aortic aneurysm 5. Bilateral Bosniak category 1 cysts 6. Scarring versus atelectasis within the lung bases   Electronically Signed   By: Salome Holmes M.D.   On: 08/01/2013 19:55   Dg Chest Port 1 View  08/12/2013   CLINICAL DATA:  Aspiration pneumonia with volume loss  EXAM: PORTABLE CHEST - 1 VIEW  COMPARISON:  Aug 10, 2013  FINDINGS: There is increase consolidation in the left mid and lower lung zones with  significant increase in volume loss on the left. The right lung is hyperexpanded. There are scattered granulomas on the right; there is no edema or consolidation on the right. The heart size is within normal limits. Central catheter tip is in the superior vena cava near the cavoatrial junction, stable. No pneumothorax. No bone lesions.  IMPRESSION: Increase in consolidation on the left with progressive volume loss on the left. There may be a degree of mucous plugging in the left lower lobe given this change. Right lung is hyperexpanded without edema or consolidation.  Note that these changes on the left are entirely consistent with aspiration.  These results will be called to the ordering clinician or representative by the Radiologist Assistant, and communication documented in the PACS or zVision Dashboard.   Electronically Signed   By: Lowella Grip M.D.   On: 08/12/2013 10:02   Dg Chest Port 1 View  08/10/2013   CLINICAL DATA:  Aspiration.  EXAM: PORTABLE CHEST - 1 VIEW  COMPARISON:  Aug 09, 2013.  FINDINGS: Stable cardiomediastinal silhouette. No pneumothorax is noted. Right internal jugular catheter line is unchanged in position. Stable left lower lobe opacity is noted concerning for pneumonia or atelectasis with associated pleural effusion. Bony thorax is intact. Stable calcified granulomata are noted in right lower lobe.  IMPRESSION: Stable left lower lobe opacity consistent with pneumonia or atelectasis with associated pleural effusion.   Electronically Signed   By: Sabino Dick M.D.   On: 08/10/2013 09:22   Dg Chest Port 1 View  08/09/2013   CLINICAL DATA:  Follow up left lung collapse.  EXAM: PORTABLE CHEST - 1 VIEW  COMPARISON:  08/08/2013 and 08/07/2013.  FINDINGS: 0709 hr. There has been interval partial re-expansion of the left lung. There is persistent volume loss and opacity in the left lower lobe with mediastinal shift to the left. The right lung has a stable appearance with emphysema and  scattered granulomas. Right IJ central venous catheter appears unchanged. The visualized heart size and mediastinal contours are stable.  IMPRESSION: Partial re-expansion of the left lung. There is persistent left lower lobe airspace disease and volume loss which may reflect residual collapse or pneumonia.   Electronically Signed   By: Camie Patience M.D.   On: 08/09/2013 09:26   Dg Chest Port 1 View  08/08/2013   CLINICAL DATA:  80 year old male with left lung collapse.  EXAM: PORTABLE CHEST - 1 VIEW  COMPARISON:  08/07/2013 and prior radiographs  FINDINGS: New complete opacification of the left hemi thorax again noted compatible with left lung collapse. Small amount of central left lung is aerated.  Right lung is clear.  There is no evidence of pneumothorax.  A right IJ central venous catheter is again noted with tip overlying the lower SVC.  IMPRESSION: Unchanged chest radiograph with continued left lung collapse   Electronically Signed   By: Hassan Rowan M.D.   On: 08/08/2013 11:59   Dg Chest Port 1 View  08/07/2013   CLINICAL DATA:  Rhonchi.  Dysphagia.  Evaluate for aspiration.  EXAM: PORTABLE CHEST - 1 VIEW  COMPARISON:  08/04/2013 and 08/03/2013.  FINDINGS: 1632 hr. Interval extubation with removal of the nasogastric tube. A right IJ central venous catheter appears unchanged at the mid SVC level. There is new near complete opacification of the left hemithorax with volume loss and mediastinal shift to the left. The left upper lobe remains minimally aerated. The right lung has a stable appearance with calcified granulomas. No pneumothorax is evident.  IMPRESSION: New near-complete opacification of the left hemithorax with volume loss, most consistent with mucous plugging. Acute aspiration cannot be excluded.  These results were called by telephone at the time of interpretation on 08/07/2013 at 5:23 PM to the patient's nurse, Landis Martins , who verbally acknowledged these results.   Electronically Signed   By:  Camie Patience M.D.   On: 08/07/2013 17:24   Dg Chest Port 1 View  08/04/2013   CLINICAL DATA:  Assess endotracheal tube  EXAM: PORTABLE CHEST - 1 VIEW  COMPARISON:  08/03/2013  FINDINGS: Endotracheal tube tip measures 3.2 cm above the  carina, without significant change allowing for differences in patient positioning.  Right internal jugular central venous line has its tip in the lower superior vena cava.  Nasogastric tube tip lies at the GE junction. It does not pass into the stomach.  Lungs are hyperexpanded. Patchy irregular interstitial opacities are noted in the left perihilar and lower lung zone distribution, stable. No new lung opacities. No pleural effusion. No pneumothorax.  IMPRESSION: 1. Nasogastric tube tip lies at the GE junction. It does not pass into the stomach. Recommend further inserting 10-15 cm for more optimal positioning. 2. Endotracheal tube and right internal jugular central venous line are stable in well positioned. 3. No change in lung aeration.   Electronically Signed   By: Lajean Manes M.D.   On: 08/04/2013 08:08   Portable Chest Xray  08/03/2013   CLINICAL DATA:  Endotracheal tube and OG tube placement. Acute respiratory failure.  EXAM: PORTABLE CHEST - 1 VIEW 8:44 p.m.  COMPARISON:  08/03/2013 at 4:41 a.m.  FINDINGS: Endotracheal tube is in good position 2 cm above the aortic arch. OG tube tip is below the diaphragm. Right jugular vein catheter tip is in the superior vena cava just above the level of the carina.  Heart size and vascularity are normal. No acute abnormality of the right lung. New atelectasis at the left lung base. Surgical staples in the left lung. Calcified granulomas bilaterally.  IMPRESSION: Central line, ET tube, and OG tube as described. New atelectasis at the left lung base. Chronic lung disease.   Electronically Signed   By: Rozetta Nunnery M.D.   On: 08/03/2013 21:06   Dg Chest Port 1 View  08/03/2013   CLINICAL DATA:  Follow-up of left lower lobe infiltrate.   EXAM: PORTABLE CHEST - 1 VIEW  COMPARISON:  CT ANGIO CHEST W/CM &/OR WO/CM dated 08/01/2013; DG CHEST 1V PORT dated 08/01/2013  FINDINGS: The lungs are well-expanded. Density previously demonstrated in the left lower lobe is less conspicuous on the current exam. The cardiac silhouette is top-normal in size. The pulmonary vascularity is not engorged. The mediastinum is normal in width. There is evidence of previous granulomatous infection which is stable.  IMPRESSION: There has been improvement in the appearance of the left lower lobe with evidence of resolution of atelectasis. Surgical suture material appears unchanged overlying the lateral costophrenic gutter. There are findings consistent with COPD and previous granulomatous infection.   Electronically Signed   By: David  Martinique   On: 08/03/2013 06:26   Dg Chest Port 1 View  08/01/2013   CLINICAL DATA:  Hypoxia.  Decreased oxygen saturations.  EXAM: PORTABLE CHEST - 1 VIEW  COMPARISON:  Chest x-ray 08/01/2013.  FINDINGS: Emphysematous changes are noted throughout the lungs bilaterally. Multiple calcified granulomas are noted. Compared to the recent prior examinations, there is developing opacification in the left base partially obscuring the left hemidiaphragm, concerning for developing atelectasis and/or consolidation (there is slight right-to-left shift of cardiomediastinal structures, indicative of a significant component of atelectasis). There is mild blunting of the left costophrenic sulcus, which could suggest a small left pleural effusion. No evidence of pulmonary edema. Heart size is upper limits of normal. Mediastinal contours are distorted by leftward shift of cardiomediastinal structures. Atherosclerosis in the thoracic aorta. Surgical clips project over the right upper quadrant of the abdomen, compatible with prior cholecystectomy. There are also suture lines projecting over the lateral aspect of the left lung base and the left mid to upper lung,  suggesting prior wedge  resections.  IMPRESSION: 1. Interval worsening aeration at the left base compatible with developing left lower lobe atelectasis. Superimposed airspace consolidation from infection or recent aspiration is not excluded. 2. Postoperative changes, as above. 3. Atherosclerosis. 4. Emphysema.   Electronically Signed   By: Vinnie Langton M.D.   On: 08/01/2013 22:47   Dg Abd Portable 2v  08/02/2013   CLINICAL DATA:  Ileus.  EXAM: PORTABLE ABDOMEN - 2 VIEW  COMPARISON:  CT 08/01/2013.  FINDINGS: Distended loops of small and large bowel are again noted and. No interim change noted from prior CT. No free air. Surgical clips right upper quadrant. Contrast noted in the renal collecting systems and bladder. Mild left base atelectasis and/or infiltrate. Diffuse lumbar spine degenerative change and osteopenia. Aortoiliac atherosclerotic vascular disease. Vascular stent is noted over the left upper abdomen.  IMPRESSION: Findings consistent with persistent unchanged adynamic ileus .   Electronically Signed   By: Marcello Moores  Register   On: 08/02/2013 07:21   Dg Swallowing Func-speech Pathology  08/06/2013   Katherene Ponto Deblois, CCC-SLP     08/06/2013 10:58 AM Objective Swallowing Evaluation: Modified Barium Swallowing Study   Patient Details  Name: JUA KIKER MRN: [id number] Date of Birth: 12-25-1923  Today's Date: 08/06/2013 Time: [id number] SLP Time Calculation (min): 15 min  Past Medical History:  Past Medical History  Diagnosis Date  . Atrial fibrillation   . AAA (abdominal aortic aneurysm)   . Carotid stenosis   . Hypercholesteremia   . Cardiomyopathy     improved  . Renal artery stenosis   . COPD (chronic obstructive pulmonary disease)   . Osteoarthritis   . Atrial flutter     s/p ablation  . Hip fracture   . DEMENTIA   . Hypertension   . Macular degeneration disease   . Prostate cancer    Past Surgical History:  Past Surgical History  Procedure Laterality Date  . Appendectomy    . Cholecystectomy     . Tonsillectomy    . Intramedullary (im) nail intertrochanteric Right 08/03/2013    Procedure: INTRAMEDULLARY (IM) NAIL INTERTROCHANTRIC;  Surgeon:  Mauri Pole, MD;  Location: [facility];  Service: Orthopedics;   Laterality: Right;   HPI:  80 y.o. male with a past medical history of atrial fibrillation,  HTN, AAA, COPD, prostate cancer, history of a CVA, history of  dementia, who lives with his daughter. Cough over past few weeks  MD note documented fever up to 103F.   Pt. admitted after fall  with resultant right hip fractureHistory of dysphagia and used to  be on a special diet, but not anymore according to the daughter.  Pt. has had 4 previous MBS' with Dys 3, nectar for past 3  (09/18/10). CXR 5/17 revealed interval worsening aeration at the  left base compatible with developing left lower lobe atelectasis.  Superimposed airspace consolidation from infection or recent  aspiration is not excluded.  Chest CT revealed soft tissue  effaces the left lower lobe bronchus suggesting mucus  plugging/aspiration, with bronchial wall thickening/bronchitis  within the left upper lobe and lingula. Pt assessed by SLP on  5/18l, determined to need objective study, but not ready.  Underwent ORIF on 5/19, remained intubated until 5/20. New orders  received.      Assessment / Plan / Recommendation Clinical Impression  Dysphagia Diagnosis: Mild oral phase dysphagia;Severe pharyngeal  phase dysphagia Clinical impression: Pt demonstrates a moderate oropharyngeal  dysphagia due to motor and structural deficits. Pt with slow  oral  transit and pooling of liquids in the anterior sulcus. Delayed  swallow initiation and pooling of liquids in valleculae and  pyrifoms leads to penetration during and after the swallow.  Epiglottic deflection is severely limited by weak hyoid  excursion, base of tongue and appearance of osteophytes on  cervical spine, blocking movement of epiglottis to protect  airway. When the pt is encouraged to put chin down  (full tuck  never achieved) the valleculae opens to capture most of the bolus  and the epiglottis moves more freely to allow better bolus  passage. The pt is recommended to consume pureed aolids and Honey  thick liquids via teaspoon, alternating between the two with  multiple swallow and chin down. Will follow for upgrade at  bedside as pt senses penetration with a throat clear.     Treatment Recommendation  Therapy as outlined in treatment plan below    Diet Recommendation Dysphagia 1 (Puree);Honey-thick liquid   Liquid Administration via: Spoon Medication Administration: Crushed with puree Supervision: Staff to assist with self feeding Compensations: Slow rate;Small sips/bites;Check for anterior  loss;Multiple dry swallows after each bite/sip;Follow solids with  liquid Postural Changes and/or Swallow Maneuvers: Seated upright 90  degrees;Upright 30-60 min after meal;Chin tuck    Other  Recommendations Oral Care Recommendations: Oral care BID Other Recommendations: Order thickener from pharmacy   Follow Up Recommendations  Skilled Nursing facility    Frequency and Duration min 2x/week  2 weeks   Pertinent Vitals/Pain NA    SLP Swallow Goals     General HPI: 80 y.o. male with a past medical history of atrial  fibrillation, HTN, AAA, COPD, prostate cancer, history of a CVA,  history of dementia, who lives with his daughter. Cough over past  few weeks MD note documented fever up to 103F.   Pt. admitted  after fall with resultant right hip fractureHistory of dysphagia  and used to be on a special diet, but not anymore according to  the daughter. Pt. has had 4 previous MBS' with Dys 3, nectar for  past 3 (09/18/10). CXR 5/17 revealed interval worsening aeration at  the left base compatible with developing left lower lobe  atelectasis. Superimposed airspace consolidation from infection  or recent aspiration is not excluded.  Chest CT revealed soft  tissue effaces the left lower lobe bronchus suggesting mucus   plugging/aspiration, with bronchial wall thickening/bronchitis  within the left upper lobe and lingula. Pt assessed by SLP on  5/18l, determined to need objective study, but not ready.  Underwent ORIF on 5/19, remained intubated until 5/20. New orders  received.  Type of Study: Modified Barium Swallowing Study Reason for Referral: Objectively evaluate swallowing function Previous Swallow Assessment: 5/18 - NPO and 4 prior MBS  Diet Prior to this Study: NPO Temperature Spikes Noted: No Respiratory Status: Nasal cannula History of Recent Intubation: Yes Length of Intubations (days): 2 days Date extubated: 08/04/13 Behavior/Cognition: Requires cueing;Alert;Cooperative;Pleasant  mood Oral Cavity - Dentition: Dentures, not available Oral Motor / Sensory Function: Within functional limits Self-Feeding Abilities: Total assist Patient Positioning: Upright in chair Baseline Vocal Quality: Low vocal intensity;Wet Volitional Cough: Weak;Congested Volitional Swallow: Able to elicit Anatomy:  (appearance of osteophytes on cervical spine, no  radiologist ) Pharyngeal Secretions: Not observed secondary MBS    Reason for Referral Objectively evaluate swallowing function   Oral Phase Oral Preparation/Oral Phase Oral Phase: Impaired Oral - Honey Oral - Honey Teaspoon: Delayed oral transit;Pocketing in anterior  sulcus Oral - Nectar Oral -  Nectar Teaspoon: Delayed oral transit;Pocketing in  anterior sulcus Oral - Thin Oral - Thin Cup: Pocketing in anterior sulcus;Delayed oral  transit Oral - Solids Oral - Puree: Delayed oral transit   Pharyngeal Phase Pharyngeal Phase Pharyngeal Phase: Impaired Pharyngeal - Honey Pharyngeal - Honey Teaspoon: Delayed swallow initiation;Premature  spillage to valleculae;Reduced epiglottic inversion;Reduced  tongue base retraction;Pharyngeal residue - valleculae;Reduced  anterior laryngeal mobility Pharyngeal - Nectar Pharyngeal - Nectar Teaspoon: Delayed swallow  initiation;Premature spillage to  valleculae;Reduced epiglottic  inversion;Reduced tongue base retraction;Pharyngeal residue -  valleculae;Penetration/Aspiration during  swallow;Penetration/Aspiration after swallow;Premature spillage  to pyriform sinuses;Trace aspiration;Reduced anterior laryngeal  mobility Penetration/Aspiration details (nectar teaspoon): Material enters  airway, CONTACTS cords and not ejected out Pharyngeal - Thin Pharyngeal - Thin Cup: Delayed swallow initiation;Premature  spillage to valleculae;Reduced epiglottic inversion;Reduced  tongue base retraction;Pharyngeal residue -  valleculae;Penetration/Aspiration during  swallow;Penetration/Aspiration after swallow;Premature spillage  to pyriform sinuses;Trace aspiration;Reduced anterior laryngeal  mobility Penetration/Aspiration details (thin cup): Material enters  airway, CONTACTS cords and not ejected out Pharyngeal - Solids Pharyngeal - Puree: Delayed swallow initiation;Premature spillage  to valleculae;Reduced epiglottic inversion;Reduced anterior  laryngeal mobility;Reduced tongue base retraction;Pharyngeal  residue - valleculae  Cervical Esophageal Phase    GO             Herbie Baltimore, MA CCC-SLP [phone]  Katherene Ponto Deblois 08/06/2013, 10:56 AM     Microbiology: No results found for this or any previous visit (from the past 240 hour(s)).   Labs: Basic Metabolic Panel:  Recent Labs Lab 08/07/13 0535 08/08/13 0500 08/09/13 0500 08/11/13 0545 08/12/13 0620 08/13/13 0516  NA 140 140 142 144 141 139  K 2.5* 2.7* 3.6* 2.4* 3.4* 5.0  CL 103 99 104 104 104 102  CO2 27 29 25 31 29 29   GLUCOSE 132* 111* 102* 113* 104* 114*  BUN 6 7 14 12 9 9   CREATININE 0.47* 0.49* 0.56 0.53 0.53 0.59  CALCIUM 8.0* 7.8* 7.9* 8.0* 8.0* 8.7  MG 1.7  --   --   --   --   --    Liver Function Tests: No results found for this basename: AST, ALT, ALKPHOS, BILITOT, PROT, ALBUMIN,  in the last 168 hours No results found for this basename: LIPASE, AMYLASE,  in the last 168  hours No results found for this basename: AMMONIA,  in the last 168 hours CBC:  Recent Labs Lab 08/07/13 0535 08/09/13 0500 08/11/13 0545 08/12/13 0620  WBC 9.9 13.0* 12.4* 11.0*  HGB 8.7* 9.1* 8.8* 8.7*  HCT 24.2* 27.4* 26.2* 26.1*  MCV 90.3 93.2 92.9 93.5  PLT 181 243 250 254   Cardiac Enzymes: No results found for this basename: CKTOTAL, CKMB, CKMBINDEX, TROPONINI,  in the last 168 hours BNP: BNP (last 3 results)  Recent Labs  07/31/13 1842 08/07/13 1833  PROBNP 1679.0* 8854.0*   CBG: No results found for this basename: GLUCAP,  in the last 168 hours  Signed:  Delfina Redwood, MD  [facility] 08/13/2013, 10:17 AM

## 2013-08-13 NOTE — Progress Notes (Signed)
IV and tele monitor d/c at this time; pt to d/c to SNF today; will cont. To monitor.

## 2013-09-08 ENCOUNTER — Telehealth: Payer: Self-pay | Admitting: Internal Medicine

## 2013-09-08 NOTE — Telephone Encounter (Signed)
Pt is being discharged from [facility] on 09/09/13.  Hospice nurse needs to know if Dr. Burnice Logan would be attending physician and if he would like hospice docs to assist with symptom management.

## 2013-09-09 NOTE — Telephone Encounter (Signed)
Spoke to [name], told her yes Dr. Burnice Logan will be attending and would like hospice to assist. [name] verbalized understanding.

## 2013-09-09 NOTE — Telephone Encounter (Signed)
Yes, hospice to assist please

## 2013-09-16 ENCOUNTER — Ambulatory Visit: Payer: Self-pay | Admitting: Internal Medicine

## 2013-09-16 ENCOUNTER — Ambulatory Visit (INDEPENDENT_AMBULATORY_CARE_PROVIDER_SITE_OTHER): Payer: Medicare Other | Admitting: Internal Medicine

## 2013-09-16 ENCOUNTER — Encounter: Payer: Self-pay | Admitting: Internal Medicine

## 2013-09-16 VITALS — BP 140/80 | HR 50 | Temp 98.5°F | Resp 18 | Ht 74.0 in | Wt 124.0 lb

## 2013-09-16 DIAGNOSIS — E43 Unspecified severe protein-calorie malnutrition: Secondary | ICD-10-CM

## 2013-09-16 DIAGNOSIS — I10 Essential (primary) hypertension: Secondary | ICD-10-CM

## 2013-09-16 DIAGNOSIS — F0390 Unspecified dementia without behavioral disturbance: Secondary | ICD-10-CM

## 2013-09-16 DIAGNOSIS — I739 Peripheral vascular disease, unspecified: Secondary | ICD-10-CM

## 2013-09-16 DIAGNOSIS — S72001S Fracture of unspecified part of neck of right femur, sequela: Secondary | ICD-10-CM

## 2013-09-16 DIAGNOSIS — S72009S Fracture of unspecified part of neck of unspecified femur, sequela: Secondary | ICD-10-CM

## 2013-09-16 DIAGNOSIS — R5381 Other malaise: Secondary | ICD-10-CM

## 2013-09-16 DIAGNOSIS — I4891 Unspecified atrial fibrillation: Secondary | ICD-10-CM

## 2013-09-16 MED ORDER — METOPROLOL TARTRATE 50 MG PO TABS: 50.0000 mg | ORAL_TABLET | Freq: Two times a day (BID) | ORAL | Status: AC

## 2013-09-16 MED ORDER — DILTIAZEM HCL 30 MG PO TABS: 30.0000 mg | ORAL_TABLET | Freq: Three times a day (TID) | ORAL | Status: AC

## 2013-09-16 MED ORDER — LORAZEPAM 0.5 MG PO TABS: 0.5000 mg | ORAL_TABLET | Freq: Two times a day (BID) | ORAL | Status: AC | PRN

## 2013-09-16 MED ORDER — LEVALBUTEROL HCL 0.63 MG/3ML IN NEBU: 0.6300 mg | INHALATION_SOLUTION | Freq: Four times a day (QID) | RESPIRATORY_TRACT | Status: AC

## 2013-09-16 NOTE — Patient Instructions (Signed)
Home physical therapy as discussed

## 2013-09-16 NOTE — Progress Notes (Signed)
Pre visit review using our clinic review tool, if applicable. No additional management support is needed unless otherwise documented below in the visit note. 

## 2013-09-16 NOTE — Progress Notes (Signed)
Subjective:    Patient ID: [name], male    DOB: [birth year], 80 y.o.   MRN: [id number]  HPI  80 year old patient who has multiple comorbidities, including hypertension, dementia, chronic atrial relation peripheral arterial disease, and protein calorie malnutrition.  The patient has had a recent right hip fracture and had a temporary stay at a rehabilitation facility, but now has returned home.  He has family that assists with his care.  He has been onpureed Diet.   The patient has advanced COPD, which has been oxygen dependent.  Presently, he is wheelchair-bound since his recent fractured hip.  Prior to this he was ambulatory with a walker.  Family is interested in in home physical therapy  Past Medical History  Diagnosis Date  . Atrial fibrillation   . AAA (abdominal aortic aneurysm)   . Carotid stenosis   . Hypercholesteremia   . Cardiomyopathy     improved  . Renal artery stenosis   . COPD (chronic obstructive pulmonary disease)   . Osteoarthritis   . Atrial flutter     s/p ablation  . Hip fracture   . DEMENTIA   . Hypertension   . Macular degeneration disease   . Prostate cancer     History   Social History  . Marital Status: Widowed    Spouse Name: N/A    Number of Children: N/A  . Years of Education: N/A   Occupational History  . Not on file.   Social History Main Topics  . Smoking status: Former Smoker -- 1.00 packs/day for 15 years    Types: Cigarettes    Quit date: 01/21/2001  . Smokeless tobacco: Former Systems developer  . Alcohol Use: No  . Drug Use: No  . Sexual Activity: Not on file   Other Topics Concern  . Not on file   Social History Narrative   ** Merged History Encounter **        Past Surgical History  Procedure Laterality Date  . Appendectomy    . Cholecystectomy    . Tonsillectomy    . Intramedullary (im) nail intertrochanteric Right 08/03/2013    Procedure: INTRAMEDULLARY (IM) NAIL INTERTROCHANTRIC;  Surgeon: Mauri Pole, MD;  Location:  [facility];  Service: Orthopedics;  Laterality: Right;    Family History  Problem Relation Age of Onset  . Heart attack Father     No Known Allergies  Current Outpatient Prescriptions on File Prior to Visit  Medication Sig Dispense Refill  . aspirin EC 81 MG tablet Take 81 mg by mouth every evening.       . feeding supplement, ENSURE, (ENSURE) PUDG Take 1 Container by mouth 3 (three) times daily between meals.    0  . traMADol-acetaminophen (ULTRACET) 37.5-325 MG per tablet Take 1 tablet by mouth every 6 (six) hours as needed.  60 tablet  0   No current facility-administered medications on file prior to visit.    BP 140/80  Pulse 50  Temp(Src) 98.5 F (36.9 C) (Oral)  Resp 18  Ht 6\' 2"  (1.88 m)  Wt 124 lb (56.246 kg)  BMI 15.91 kg/m2  SpO2 96%       Review of Systems  Constitutional: Positive for activity change, appetite change and fatigue. Negative for fever and chills.  HENT: Negative for congestion, dental problem, ear pain, hearing loss, sore throat, tinnitus, trouble swallowing and voice change.   Eyes: Negative for pain, discharge and visual disturbance.  Respiratory: Negative for cough, chest tightness, wheezing  and stridor.   Cardiovascular: Negative for chest pain, palpitations and leg swelling.  Gastrointestinal: Negative for nausea, vomiting, abdominal pain, diarrhea, constipation, blood in stool and abdominal distention.  Genitourinary: Negative for urgency, hematuria, flank pain, discharge, difficulty urinating and genital sores.  Musculoskeletal: Negative for arthralgias, back pain, gait problem, joint swelling, myalgias and neck stiffness.  Skin: Negative for rash.  Neurological: Positive for weakness. Negative for dizziness, syncope, speech difficulty, numbness and headaches.  Hematological: Negative for adenopathy. Does not bruise/bleed easily.  Psychiatric/Behavioral: Positive for confusion. Negative for behavioral problems and dysphoric mood. The patient  is not nervous/anxious.        Objective:   Physical Exam  Constitutional: He is oriented to person, place, and time. He appears well-developed.  Confused Weak and chronically ill appearing Blood pressure normal  HENT:  Head: Normocephalic.  Right Ear: External ear normal.  Left Ear: External ear normal.  Eyes: Conjunctivae and EOM are normal.  Neck: Normal range of motion.  Cardiovascular: Normal rate and normal heart sounds.   Pulmonary/Chest:  Diminished breath sounds Nasal cannula oxygen in place  Abdominal: Bowel sounds are normal.  Musculoskeletal: Normal range of motion. He exhibits no edema and no tenderness.  Neurological: He is alert and oriented to person, place, and time.  Psychiatric: He has a normal mood and affect. His behavior is normal.          Assessment & Plan:   Advanced COPD Deconditioning Protein calorie malnutrition Chronic atrial fibrillation History of hypertension, stable  We'll set up for home physical therapy Recheck 3 months Medicines unchanged

## 2013-09-21 ENCOUNTER — Telehealth: Payer: Self-pay | Admitting: Internal Medicine

## 2013-09-22 NOTE — Telephone Encounter (Signed)
Pt expired 2013-10-15 at 8:56 pm

## 2013-09-27 ENCOUNTER — Telehealth: Payer: Self-pay

## 2013-09-27 NOTE — Telephone Encounter (Signed)
Patient died @ Home per [facility] °

## 2013-10-16 NOTE — Telephone Encounter (Signed)
Per Jeraldine Loots from [facility] [phone])  called to inform Dr. Inda Merlin  That the patient is declining in home physical therapy, also feels pt isn't mobile enough to do ,please advise .

## 2013-10-16 NOTE — Telephone Encounter (Signed)
Left detailed message on [facility], okay to cancel Physical Therapy for pt per Dr. Raliegh Ip.

## 2013-10-16 NOTE — Telephone Encounter (Signed)
Informed [facility] to cancel call their office to inform to cancel

## 2013-10-16 NOTE — Telephone Encounter (Signed)
Ok to cancel

## 2013-10-16 DEATH — deceased
